# Patient Record
Sex: Female | Born: 1965 | Race: Black or African American | Hispanic: No | Marital: Married | Smoking: Never smoker
Health system: Southern US, Community
[De-identification: ages and names within clinical notes are randomized; demographics above are authoritative.]

## PROBLEM LIST (undated history)

## (undated) DIAGNOSIS — E559 Vitamin D deficiency, unspecified: Secondary | ICD-10-CM

## (undated) DIAGNOSIS — IMO0002 Reserved for concepts with insufficient information to code with codable children: Secondary | ICD-10-CM

## (undated) DIAGNOSIS — G43909 Migraine, unspecified, not intractable, without status migrainosus: Secondary | ICD-10-CM

## (undated) DIAGNOSIS — G4700 Insomnia, unspecified: Secondary | ICD-10-CM

## (undated) DIAGNOSIS — R5383 Other fatigue: Secondary | ICD-10-CM

## (undated) DIAGNOSIS — R0602 Shortness of breath: Secondary | ICD-10-CM

## (undated) DIAGNOSIS — D649 Anemia, unspecified: Secondary | ICD-10-CM

## (undated) DIAGNOSIS — I10 Essential (primary) hypertension: Secondary | ICD-10-CM

## (undated) DIAGNOSIS — M1990 Unspecified osteoarthritis, unspecified site: Secondary | ICD-10-CM

## (undated) DIAGNOSIS — M549 Dorsalgia, unspecified: Secondary | ICD-10-CM

## (undated) DIAGNOSIS — F419 Anxiety disorder, unspecified: Secondary | ICD-10-CM

## (undated) DIAGNOSIS — E538 Deficiency of other specified B group vitamins: Secondary | ICD-10-CM

## (undated) HISTORY — DX: Dorsalgia, unspecified: M54.9

## (undated) HISTORY — DX: Migraine, unspecified, not intractable, without status migrainosus: G43.909

## (undated) HISTORY — DX: Insomnia, unspecified: G47.00

## (undated) HISTORY — DX: Essential (primary) hypertension: I10

## (undated) HISTORY — DX: Reserved for concepts with insufficient information to code with codable children: IMO0002

## (undated) HISTORY — DX: Shortness of breath: R06.02

## (undated) HISTORY — DX: Anemia, unspecified: D64.9

## (undated) HISTORY — DX: Vitamin D deficiency, unspecified: E55.9

## (undated) HISTORY — DX: Other fatigue: R53.83

## (undated) HISTORY — DX: Deficiency of other specified B group vitamins: E53.8

## (undated) HISTORY — DX: Anxiety disorder, unspecified: F41.9

---

## 1996-11-10 HISTORY — PX: TUBAL LIGATION: SHX77

## 2003-11-11 HISTORY — PX: APPENDECTOMY: SHX54

## 2007-05-17 ENCOUNTER — Other Ambulatory Visit: Admission: RE | Admit: 2007-05-17 | Discharge: 2007-05-17 | Payer: Self-pay | Admitting: Obstetrics & Gynecology

## 2012-04-10 HISTORY — PX: BREAST CYST EXCISION: SHX579

## 2012-06-10 DIAGNOSIS — IMO0002 Reserved for concepts with insufficient information to code with codable children: Secondary | ICD-10-CM

## 2012-06-10 DIAGNOSIS — R87619 Unspecified abnormal cytological findings in specimens from cervix uteri: Secondary | ICD-10-CM

## 2012-06-10 HISTORY — DX: Reserved for concepts with insufficient information to code with codable children: IMO0002

## 2012-06-10 HISTORY — DX: Unspecified abnormal cytological findings in specimens from cervix uteri: R87.619

## 2012-07-11 HISTORY — PX: COLPOSCOPY: SHX161

## 2013-06-23 ENCOUNTER — Encounter: Payer: Self-pay | Admitting: Certified Nurse Midwife

## 2013-06-27 ENCOUNTER — Encounter: Payer: Self-pay | Admitting: Certified Nurse Midwife

## 2013-06-27 ENCOUNTER — Ambulatory Visit (INDEPENDENT_AMBULATORY_CARE_PROVIDER_SITE_OTHER): Payer: BC Managed Care – PPO | Admitting: Certified Nurse Midwife

## 2013-06-27 VITALS — BP 108/64 | HR 60 | Resp 16 | Ht 68.25 in | Wt 195.0 lb

## 2013-06-27 DIAGNOSIS — N870 Mild cervical dysplasia: Secondary | ICD-10-CM

## 2013-06-27 DIAGNOSIS — Z01419 Encounter for gynecological examination (general) (routine) without abnormal findings: Secondary | ICD-10-CM

## 2013-06-27 NOTE — Progress Notes (Signed)
47 y.o. . Married g2p2002  African American Fe here for annual exam. Periods irregular past 3 months, now regular. Has gained 10 lbs.in past year. Has scheduled visit with nutritionist for diet counseling and plans to start Weight watchers.  Daughter(25) recently moved into apartment, for first time!! Sees PCP for aex and labs, recently decreased her Lexapro to 5 mg to help with weight change, working well.  No health issues today.  Patient's last menstrual period was 06/19/2013.          Sexually active: yes  The current method of family planning is tubal ligation.    Exercising: yes  some walking Smoker:  no  Health Maintenance: Pap:  06/18/12 ASCUS HPV + Colposcopy9/13 CIN1 MMG:  05/02/13 normal Colonoscopy:  2005 BMD:   none TDaP:  2006 Labs: none Self breast exam: done occ   reports that she has never smoked. She does not have any smokeless tobacco history on file. She reports that she does not drink alcohol or use illicit drugs.  Past Medical History  Diagnosis Date  . Insomnia   . Abnormal Pap smear 8/13    ASCUS +HPV  . Anxiety   . Anemia   . Hip arthritis     left    Past Surgical History  Procedure Laterality Date  . Colposcopy  9/13    CIN1  . Tubal ligation  1998    BTL  . Appendectomy  2005  . Breast cyst excision Left 6/13    Current Outpatient Prescriptions  Medication Sig Dispense Refill  . B Complex Vitamins (B COMPLEX PO) Take by mouth daily.      . Butalbital-Aspirin-Caffeine (BUTALBITAL-ASA-CAFFEINE PO) Take by mouth as needed.      Marland Kitchen escitalopram (LEXAPRO) 10 MG tablet Take 5 mg by mouth daily.       . Multiple Vitamins-Minerals (MULTIVITAMIN PO) Take by mouth daily.       No current facility-administered medications for this visit.    Family History  Problem Relation Age of Onset  . Hypertension Mother   . Hypertension Father     ROS:  Pertinent items are noted in HPI.  Otherwise, a comprehensive ROS was negative.  Exam:   BP 108/64   Pulse 60  Resp 16  Ht 5' 8.25" (1.734 m)  Wt 195 lb (88.451 kg)  BMI 29.42 kg/m2  LMP 06/19/2013 Height: 5' 8.25" (173.4 cm)  Ht Readings from Last 3 Encounters:  06/27/13 5' 8.25" (1.734 m)    General appearance: alert, cooperative and appears stated age Head: Normocephalic, without obvious abnormality, atraumatic Neck: no adenopathy, supple, symmetrical, trachea midline and thyroid normal to inspection and palpation Lungs: clear to auscultation bilaterally Breasts: normal appearance, no masses or tenderness, No nipple retraction or dimpling, No nipple discharge or bleeding, No axillary or supraclavicular adenopathy Heart: regular rate and rhythm Abdomen: soft, non-tender; no masses,  no organomegaly Extremities: extremities normal, atraumatic, no cyanosis or edema Skin: Skin color, texture, turgor normal. No rashes or lesions Lymph nodes: Cervical, supraclavicular, and axillary nodes normal. No abnormal inguinal nodes palpated Neurologic: Grossly normal   Pelvic: External genitalia:  no lesions              Urethra:  normal appearing urethra with no masses, tenderness or lesions              Bartholin's and Skene's: normal                 Vagina: normal appearing  vagina with normal color and discharge, no lesions              Cervix: normal, non tender              Pap taken: yes Bimanual Exam:  Uterus:  normal size, contour, position, consistency, mobility, non-tender and anteverted              Adnexa: normal adnexa and no mass, fullness, tenderness               Rectovaginal: Confirms               Anus:  normal sphincter tone, no lesions  A:  Well Woman with normal exam  History of anxiety with medication dosage change with PCP management.  Weight change with nutrition consult scheduled  History of abnormal pap CIN1  P:  Reviewed health and wellness pertinent to exam  Continue follow up as indicated  Work on regular exercise with also weight training  Repeat pap today  if negative repeat pap in one year,if  Not per results   Pap smear as per guidelines   Mammogram yearly pap smear taken today with HPVHR  counseled on breast self exam, mammography screening, adequate intake of calcium and vitamin D, diet and exercise  return annually or prn  An After Visit Summary was printed and given to the patient.

## 2013-06-27 NOTE — Patient Instructions (Addendum)

## 2013-06-28 ENCOUNTER — Encounter: Payer: Self-pay | Admitting: Certified Nurse Midwife

## 2013-06-30 NOTE — Progress Notes (Signed)
Note reviewed, agree with plan.  Rosebud Koenen, MD  

## 2013-07-01 LAB — IPS PAP TEST WITH HPV

## 2013-07-13 ENCOUNTER — Other Ambulatory Visit: Payer: Self-pay | Admitting: Certified Nurse Midwife

## 2013-07-13 DIAGNOSIS — IMO0001 Reserved for inherently not codable concepts without codable children: Secondary | ICD-10-CM

## 2013-07-14 ENCOUNTER — Telehealth: Payer: Self-pay | Admitting: Orthopedic Surgery

## 2013-07-14 NOTE — Telephone Encounter (Addendum)
Spoke with pt about Pap results and need for repeat colpo with DL. Pt is a Runner, broadcasting/film/video and needs a later appt. Sched appt 07-21-13 at 4 pm. Advised pt to take 800 mg of ibuprofen an hour before the appt with food and be sure she is hydrated as well. Pt is expecting menses at some point next week, but her cycles are irregular. Advised pt to call back to reschedule if her menses starts in the days before the appt, as a clear view of the cervix is needed for procedure. Pt agreeable. Pt would also like DL to give her a call when she gets a chance.

## 2013-07-14 NOTE — Telephone Encounter (Signed)
Message copied by Alfredo Batty on Thu Jul 14, 2013  9:44 AM ------      Message from: Verner Chol      Created: Wed Jul 13, 2013 10:34 PM       Notify patient that pap smear is ASCUS again but HPV negative, but will need repeat colposcopy as we discussed.      Order in for colposcopy ------

## 2013-07-20 NOTE — Telephone Encounter (Signed)
Spoke with patient and rescheduled her colpo for 9/15 @ 4 pm. Patient has a question for Debbi in regards to the colposcopy. Please call patient.

## 2013-07-21 ENCOUNTER — Ambulatory Visit: Payer: BC Managed Care – PPO

## 2013-07-21 ENCOUNTER — Telehealth: Payer: Self-pay | Admitting: Certified Nurse Midwife

## 2013-07-21 ENCOUNTER — Ambulatory Visit: Payer: BC Managed Care – PPO | Admitting: Certified Nurse Midwife

## 2013-07-21 NOTE — Telephone Encounter (Signed)
Called patient this am

## 2013-07-21 NOTE — Telephone Encounter (Signed)
This is fine 

## 2013-07-25 ENCOUNTER — Ambulatory Visit (INDEPENDENT_AMBULATORY_CARE_PROVIDER_SITE_OTHER): Payer: BC Managed Care – PPO

## 2013-07-25 ENCOUNTER — Encounter: Payer: Self-pay | Admitting: Certified Nurse Midwife

## 2013-07-25 ENCOUNTER — Ambulatory Visit (INDEPENDENT_AMBULATORY_CARE_PROVIDER_SITE_OTHER): Payer: BC Managed Care – PPO | Admitting: Certified Nurse Midwife

## 2013-07-25 VITALS — BP 110/70 | HR 64 | Resp 16 | Ht 68.25 in | Wt 197.0 lb

## 2013-07-25 DIAGNOSIS — IMO0001 Reserved for inherently not codable concepts without codable children: Secondary | ICD-10-CM

## 2013-07-25 DIAGNOSIS — R6889 Other general symptoms and signs: Secondary | ICD-10-CM

## 2013-07-25 NOTE — Progress Notes (Signed)
Pt took 400mg  ibuprofen at 3:30pm. Pt had pap 06/27/13 ASCUS -HPV

## 2013-07-25 NOTE — Telephone Encounter (Signed)
Patient aware of abnormal pap smear. Reviewed that HPVHR negative, but colposcopy needed. Patient agreeable. Questions addressed. Patient scheduled for colpo.

## 2013-07-25 NOTE — Progress Notes (Addendum)
Patient ID: Michaela Hanson, female   DOB: [birth year], 47 y.o.   MRN: [id number]  Chief Complaint  Patient presents with  . Colposcopy    pap 06/27/13 ASCUS - HPV    HPI Michaela Hanson is a 47 y.o. female. g2 p2002 here for abnormal pap smear. LMP 07/16/13. AEX   06/27/2013 all normal. Contraception BTL. Denies any health issues today. HPI  Indications: Pap smear on August 202014 showed: ASCUS with NEGATIVE high risk HPV. Previous colposcopy: CIN 1  HPV effect, ECC negative.  Prior cervical treatment: no treatment.  Past Medical History  Diagnosis Date  . Insomnia   . Abnormal Pap smear 8/13    ASCUS +HPV  . Anxiety   . Anemia   . Hip arthritis     left    Past Surgical History  Procedure Laterality Date  . Colposcopy  9/13    CIN1  . Tubal ligation  1998    BTL  . Appendectomy  2005  . Breast cyst excision Left 6/13    Family History  Problem Relation Age of Onset  . Hypertension Mother   . Hypertension Father   . Breast cancer Sister   . Breast cancer Paternal Aunt     Social History History  Substance Use Topics  . Smoking status: Never Smoker   . Smokeless tobacco: Not on file  . Alcohol Use: No    Allergies  Allergen Reactions  . Flagyl [Metronidazole]     nausea    Current Outpatient Prescriptions  Medication Sig Dispense Refill  . B Complex Vitamins (B COMPLEX PO) Take by mouth. 10 days before menstrual cycle      . Butalbital-Aspirin-Caffeine (BUTALBITAL-ASA-CAFFEINE PO) Take by mouth as needed.      Marland Kitchen escitalopram (LEXAPRO) 10 MG tablet Take 5 mg by mouth daily.       . Multiple Vitamins-Minerals (MULTIVITAMIN PO) Take by mouth daily.       No current facility-administered medications for this visit.    Review of Systems Review of Systems  Constitutional: Negative.   Genitourinary: Negative for vaginal bleeding, vaginal discharge, vaginal pain and pelvic pain.  Neurological: Negative.     Blood pressure 110/70, pulse 64, resp. rate 16, height  5' 8.25" (1.734 m), weight 197 lb (89.359 kg), last menstrual period 07/16/2013.  Physical Exam Physical Exam  Constitutional: She is oriented to person, place, and time. She appears well-developed and well-nourished.  Genitourinary: Vagina normal.    Neurological: She is alert and oriented to person, place, and time.  Skin: Skin is warm and dry.  Psychiatric: She has a normal mood and affect.    Data Reviewed Reviewed pap smear results.  Assessment   1-ASCUS pap with negative HPVHR, follow up pap from previous abnormal pap. Here for colposcopy. Procedure Details  The risks and benefits of the procedure and Written informed consent obtained.  Speculum placed in vagina and excellent visualization of cervix achieved, cervix swabbed x 3 with saline for mucous removal and  with acetic acid solution. Acetowhite effect noted. Cervix viewed with 3.75,7.7, 15.0 and green filter.Acetowhite lesion noted at 2 o'clock. Lugol's solution applied with non staining noted. Biopsy taken at 2 o'clock. ECC obtained. Monsel's applied to cervix. No active bleeding noted on removal of speculum. Patient tolerated procedure well.   Specimens: 2  Complications: none.     Plan    Specimens labelled and sent to Pathology. Patient will be notified of results when reviewed.  Instructions given to patient.  Pathology report: Biopsy showed LGSIL, mild dysplasia, changes consistent with HPV effect ECC: bland appearing endocervical glands without atypia or carcinoma, squamous epithelium with some changes suggestive of HPV effect Patient notified of result with follow up in one year with repeat Pap with HPVHR Pap recall. DL  Michaela Hanson 1/61/0960, 5:22 PM

## 2013-07-25 NOTE — Patient Instructions (Addendum)

## 2013-07-26 NOTE — Progress Notes (Signed)
Note reviewed, agree with plan.  Jayel Inks, MD  

## 2013-07-28 LAB — IPS CERVICAL/ECC/EMB/VULVAR/VAGINAL BIOPSY

## 2013-07-29 ENCOUNTER — Telehealth: Payer: Self-pay | Admitting: Certified Nurse Midwife

## 2013-07-29 NOTE — Telephone Encounter (Signed)
LMTCB regarding lab results.  

## 2013-07-29 NOTE — Telephone Encounter (Signed)
Spoke with patient and discussed findings of pathology report. Report showed LSIL with HPV effect, mild dysplasia, same as before. ECC showed HPV effect only ,no atypia or carcinoma. Questions addressed. Stressed repeat pap at aex in one year very important. Patient voiced understanding, and appreciated the call. Will put in Pap recall 08

## 2013-09-26 ENCOUNTER — Telehealth: Payer: Self-pay | Admitting: Certified Nurse Midwife

## 2013-09-26 ENCOUNTER — Ambulatory Visit: Payer: BC Managed Care – PPO | Admitting: Certified Nurse Midwife

## 2013-09-26 NOTE — Telephone Encounter (Signed)
Spoke with pt who is having vaginal odor and light brown discharge. Pt has noticed it since the beginning of November when she started a steroid pack for her back. Pt also had her cycle and still is having the issue. Advised pt that DL would really need to see her to make a diagnosis. Pt's husband is out of town but will be returning today and can hopefully drive her in for a visit today. Sched OV today at 4 pm with DL. Pt will call back after she calls him if the appt time will not work.

## 2013-09-26 NOTE — Telephone Encounter (Signed)
Spoke with pt who called to say her husband is still out of town and cannot give her a ride today. Rescheduled appt to 09-28-13 at 9:15 as pt has PT later that morning.

## 2013-09-26 NOTE — Telephone Encounter (Signed)
pt has a herniated disk and pinch nerve and has been on alot of medications. She now has a yeast infection and can not drive due to the medications she is taking. Can she have something called into the pharmacy?

## 2013-09-28 ENCOUNTER — Ambulatory Visit: Payer: BC Managed Care – PPO | Admitting: Certified Nurse Midwife

## 2013-09-28 NOTE — Telephone Encounter (Signed)
Called pt regarding missed 9:15 appointment. She thought it was at 10:15 and was very apologetic and flustered because she has noone to bring her Thursday or Friday.  She has a back injury and can't drive. Did not wish to reschedule at this time and will try something over the counter.

## 2013-11-22 ENCOUNTER — Ambulatory Visit (INDEPENDENT_AMBULATORY_CARE_PROVIDER_SITE_OTHER): Payer: BC Managed Care – PPO | Admitting: Certified Nurse Midwife

## 2013-11-22 ENCOUNTER — Encounter: Payer: Self-pay | Admitting: Certified Nurse Midwife

## 2013-11-22 VITALS — BP 110/78 | HR 72 | Resp 16 | Ht 68.25 in | Wt 202.0 lb

## 2013-11-22 DIAGNOSIS — N760 Acute vaginitis: Secondary | ICD-10-CM

## 2013-11-22 DIAGNOSIS — S3992XA Unspecified injury of lower back, initial encounter: Secondary | ICD-10-CM

## 2013-11-22 MED ORDER — METRONIDAZOLE 0.75 % VA GEL: 1.0000 | Freq: Every day | VAGINAL | Status: DC

## 2013-11-22 NOTE — Patient Instructions (Signed)

## 2013-11-22 NOTE — Progress Notes (Signed)
48 y.o.Married [place] female [phone] with a 1 month(s) history of the following:burning and discharge described as malodorous, grey and watery Sexually active: yes Last sexual activity:2 months ago. Pt also reports the following associated symptoms: none Patient has tried over the counter treatment of Monistat  with minimal relief. 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 Patient has been on 3 steroid packs and medication. Feeling better but the vaginal odor has persisted.No new personal products.  O: Healthy female WDWN Affect: normal, orientation x 3    Exam:  AOZ:[id number]:Bartholin's, Urethra, Skene's normal, no lesions, redness or tenderness                Vag:no lesions, discharge: copious, watery and malodorous, pH 5.5, wet prep done                Cx:  normal appearance and non tender                Uterus:normal size, non-tender, normal shape and consistency                Adnexa: normal adnexa and no mass, fullness, tenderness  Wet Prep shows:clue cells   GE:[id number]:bacterial vaginosis Back Injury with slipped disc under follow up Social stress with loss of mother in law   P: Reviewed findings. Discussed the medications may have contributed to ph change in vagina and BV occurrence. Rx Metrogel see order :Symptomatic local care discussed. Transport plannerducational materials distributed. Continue follow up as indicated Offered my condolences to family regarding mother in law's death.   RV prn

## 2013-11-23 DIAGNOSIS — S3992XA Unspecified injury of lower back, initial encounter: Secondary | ICD-10-CM | POA: Insufficient documentation

## 2013-11-25 ENCOUNTER — Encounter: Payer: Self-pay | Admitting: Certified Nurse Midwife

## 2013-11-25 NOTE — Progress Notes (Signed)
Reviewed personally.  M. Suzanne Dalayna Lauter, MD.  

## 2014-02-07 ENCOUNTER — Encounter: Payer: Self-pay | Admitting: Certified Nurse Midwife

## 2014-02-07 ENCOUNTER — Encounter: Payer: BC Managed Care – PPO | Admitting: Certified Nurse Midwife

## 2014-02-08 ENCOUNTER — Ambulatory Visit: Payer: BC Managed Care – PPO | Admitting: Certified Nurse Midwife

## 2014-02-14 ENCOUNTER — Ambulatory Visit: Payer: BC Managed Care – PPO | Admitting: Certified Nurse Midwife

## 2014-02-14 NOTE — Progress Notes (Signed)
Patient unable to stay for visit due to work per [employer] Johnson CMA.

## 2014-02-16 ENCOUNTER — Ambulatory Visit (INDEPENDENT_AMBULATORY_CARE_PROVIDER_SITE_OTHER): Payer: BC Managed Care – PPO | Admitting: Certified Nurse Midwife

## 2014-02-16 ENCOUNTER — Encounter: Payer: Self-pay | Admitting: Certified Nurse Midwife

## 2014-02-16 VITALS — BP 108/66 | HR 80 | Resp 18 | Ht 68.5 in | Wt 205.0 lb

## 2014-02-16 DIAGNOSIS — N951 Menopausal and female climacteric states: Secondary | ICD-10-CM

## 2014-02-16 DIAGNOSIS — N912 Amenorrhea, unspecified: Secondary | ICD-10-CM

## 2014-02-16 LAB — POCT URINE PREGNANCY: Preg Test, Ur: NEGATIVE

## 2014-02-16 MED ORDER — MEDROXYPROGESTERONE ACETATE 10 MG PO TABS: 10.0000 mg | ORAL_TABLET | Freq: Every day | ORAL | Status: DC

## 2014-02-16 NOTE — Patient Instructions (Signed)
Perimenopause  Perimenopause is the time when your body begins to move into the menopause (no menstrual period for 12 straight months). It is a natural process. Perimenopause can begin 2 8 years before the menopause and usually lasts for 1 year after the menopause. During this time, your ovaries may or may not produce an egg. The ovaries vary in their production of estrogen and progesterone hormones each month. This can cause irregular menstrual periods, difficulty getting pregnant, vaginal bleeding between periods, and uncomfortable symptoms.  CAUSES  · Irregular production of the ovarian hormones, estrogen and progesterone, and not ovulating every month.  · Other causes include:  · Tumor of the pituitary gland in the brain.  · Medical disease that affects the ovaries.  · Radiation treatment.  · Chemotherapy.  · Unknown causes.  · Heavy smoking and excessive alcohol intake can bring on perimenopause sooner.  SIGNS AND SYMPTOMS   · Hot flashes.  · Night sweats.  · Irregular menstrual periods.  · Decreased sex drive.  · Vaginal dryness.  · Headaches.  · Mood swings.  · Depression.  · Memory problems.  · Irritability.  · Tiredness.  · Weight gain.  · Trouble getting pregnant.  · The beginning of losing bone cells (osteoporosis).  · The beginning of hardening of the arteries (atherosclerosis).  DIAGNOSIS   Your health care provider will make a diagnosis by analyzing your age, menstrual history, and symptoms. He or she will do a physical exam and note any changes in your body, especially your female organs. Female hormone tests may or may not be helpful depending on the amount of female hormones you produce and when you produce them. However, other hormone tests may be helpful to rule out other problems.  TREATMENT   In some cases, no treatment is needed. The decision on whether treatment is necessary during the perimenopause should be made by you and your health care provider based on how the symptoms are affecting you  and your lifestyle. Various treatments are available, such as:  · Treating individual symptoms with a specific medicine for that symptom.  · Herbal medicines that can help specific symptoms.  · Counseling.  · Group therapy.  HOME CARE INSTRUCTIONS   · Keep track of your menstrual periods (when they occur, how heavy they are, how long between periods, and how long they last) as well as your symptoms and when they started.  · Only take over-the-counter or prescription medicines as directed by your health care provider.  · Sleep and rest.  · Exercise.  · Eat a diet that contains calcium (good for your bones) and soy (acts like the estrogen hormone).  · Do not smoke.  · Avoid alcoholic beverages.  · Take vitamin supplements as recommended by your health care provider. Taking vitamin E may help in certain cases.  · Take calcium and vitamin D supplements to help prevent bone loss.  · Group therapy is sometimes helpful.  · Acupuncture may help in some cases.  SEEK MEDICAL CARE IF:   · You have questions about any symptoms you are having.  · You need a referral to a specialist (gynecologist, psychiatrist, or psychologist).  SEEK IMMEDIATE MEDICAL CARE IF:   · You have vaginal bleeding.  · Your period lasts longer than 8 days.  · Your periods are recurring sooner than 21 days.  · You have bleeding after intercourse.  · You have severe depression.  · You have pain when you urinate.  ·   You have severe headaches.  · You have vision problems.  Document Released: 12/04/2004 Document Revised: 08/17/2013 Document Reviewed: 05/26/2013  [employer]® Patient Information ©2014 [employer].

## 2014-02-16 NOTE — Progress Notes (Signed)
  48 y.o.MarriedAfrican Americanfemale presents with no menses sinceJanuary 21/15. Currently periods are occurring every 28 days.   Bleeding is light and moderate.  Periods were regular in the past. Patient reports no weight loss, but is having, night sweats  and hot flashes and feels bloated . Contraception BTL. Negative UPT here today. Patient not having any other health issues. No history of thyroid issues.  Pertinent items are noted in HPI. Neck: no adenopathy, supple, symmetrical, trachea midline and thyroid not enlarged, symmetric, no tenderness/mass/nodules  Abdomen: non tender Pelvic exam: External genital: normal BUS: normal Vagina: normal, scant discharge Cervix: normal, non tender Uterus: normal, anteverted, non tender Adnexa: no masses or fullness, normal, non tender  Assessment: Perimenopausal with amenorrhea and cycle changes Normal pelvic exam  Plan: Discussed with patient factors that may be contributory to menstrual abnormalities include diet normal and hormonal factors thyroid or pituitary or menopausal.  Questions addressed. Etiology and expectations with perimenopause discussed. Recommend Rx Provera 10 mg x 10 days to stimulate any withdrawal bleeding for any lining that is present. Discussed risk of heavy bleeding or hyperplasia with long periods without bleeding. Patient agreeable  Rx Provera see order Advised to keep menses calendar and to advise if has or does not have withdrawal bleeding up to 2 weeks after completes Provera. Lab:TSH,FSH,Prolactin  Patient will advise if changes.  Rv prn

## 2014-02-17 LAB — FOLLICLE STIMULATING HORMONE: FSH: 50.9 m[IU]/mL

## 2014-02-17 LAB — TSH: TSH: 1.445 u[IU]/mL (ref 0.350–4.500)

## 2014-02-17 LAB — PROLACTIN: Prolactin: 8.7 ng/mL

## 2014-02-17 NOTE — Progress Notes (Signed)
Reviewed personally.  M. Suzanne Dewane Timson, MD.  

## 2014-02-19 ENCOUNTER — Other Ambulatory Visit: Payer: Self-pay | Admitting: Certified Nurse Midwife

## 2014-02-20 ENCOUNTER — Telehealth: Payer: Self-pay

## 2014-02-20 NOTE — Telephone Encounter (Signed)
lmtcb

## 2014-02-20 NOTE — Telephone Encounter (Signed)
Message copied by Eliezer BottomJOHNSON, Jaira Canady J on Mon Feb 20, 2014 10:26 AM ------      Message from: Verner CholLEONARD, DEBORAH S      Created: Sun Feb 19, 2014  4:53 PM       Notify patient that TSH,Prolactin normal      FSH Low menopausal range,but explains hot flashes and no periods      Patient needs to start Provera 10 mg for 10 days and notify if bleeding occurs while on Provera or during the 2 week period after completion or no bleeding      Patient has Rx for Provera and may have started it which is fine. ------

## 2014-02-21 NOTE — Telephone Encounter (Signed)
Patient notified of results. See lab 

## 2014-06-26 ENCOUNTER — Ambulatory Visit (INDEPENDENT_AMBULATORY_CARE_PROVIDER_SITE_OTHER): Payer: BC Managed Care – PPO | Admitting: Certified Nurse Midwife

## 2014-06-26 ENCOUNTER — Encounter: Payer: Self-pay | Admitting: Certified Nurse Midwife

## 2014-06-26 VITALS — BP 92/60 | HR 80 | Resp 16 | Ht 68.0 in | Wt 197.0 lb

## 2014-06-26 DIAGNOSIS — R87610 Atypical squamous cells of undetermined significance on cytologic smear of cervix (ASC-US): Secondary | ICD-10-CM

## 2014-06-26 DIAGNOSIS — R87810 Cervical high risk human papillomavirus (HPV) DNA test positive: Secondary | ICD-10-CM

## 2014-06-26 DIAGNOSIS — Z124 Encounter for screening for malignant neoplasm of cervix: Secondary | ICD-10-CM

## 2014-06-26 DIAGNOSIS — Z01419 Encounter for gynecological examination (general) (routine) without abnormal findings: Secondary | ICD-10-CM

## 2014-06-26 NOTE — Progress Notes (Signed)
48 y.o. G2P2002 Married African American Fe here for annual exam. Periods normal now with Provera use in 4/15.Marland Kitchen. Patient has had 2 deaths in family which has depressed her. PCP increased Lexapro to 10 mg daily which is working much better. All labs with PCP and aex normal except for borderline elevation of cholesterol which she is working on. No other health issues today.   Patient's last menstrual period was 06/17/2014.          Sexually active: Yes.    The current method of family planning is tubal ligation.    Exercising: Yes.    Walking 4 days weekly  Smoker:  no  Health Maintenance: WGN:FAOZH+Pap:ASCUS+. HR HPV negative  MMG: 04/2014 Repeat in 6 months report requested Colonoscopy:  2005. negative BMD:  06/2014 Normal - Take Vit D and Calcium bc of age TDaP:  2006 Labs: PCP   reports that she has never smoked. She has never used smokeless tobacco. She reports that she does not drink alcohol or use illicit drugs.  Past Medical History  Diagnosis Date  . Insomnia   . Abnormal Pap smear 8/13    ASCUS +HPV, 8/14 ASCUS HPV not detected  . Anxiety   . Anemia   . Hip arthritis     left    Past Surgical History  Procedure Laterality Date  . Tubal ligation  1998    BTL  . Appendectomy  2005  . Breast cyst excision Left 6/13  . Colposcopy  9/13    CIN1, 9/14 LGSIL    Current Outpatient Prescriptions  Medication Sig Dispense Refill  . B Complex Vitamins (B COMPLEX PO) Take by mouth. 10 days before menstrual cycle      . Butalbital-Aspirin-Caffeine (BUTALBITAL-ASA-CAFFEINE PO) Take by mouth as needed.      Marland Kitchen. escitalopram (LEXAPRO) 5 MG tablet Take 10 mg by mouth daily.       . Multiple Vitamins-Minerals (MULTIVITAMIN PO) Take by mouth daily.       No current facility-administered medications for this visit.    Family History  Problem Relation Age of Onset  . Hypertension Mother   . Hypertension Father   . Breast cancer Sister   . Breast cancer Paternal Aunt     ROS:  Pertinent  items are noted in HPI.  Otherwise, a comprehensive ROS was negative.  Exam:   BP 92/60  Pulse 80  Resp 16  Ht 5\' 8"  (1.727 m)  Wt 197 lb (89.359 kg)  BMI 29.96 kg/m2  LMP 06/17/2014 Height: 5\' 8"  (172.7 cm)  Ht Readings from Last 3 Encounters:  06/26/14 5\' 8"  (1.727 m)  02/16/14 5' 8.5" (1.74 m)  02/07/14 5' 8.25" (1.734 m)    General appearance: alert, cooperative and appears stated age Head: Normocephalic, without obvious abnormality, atraumatic Neck: no adenopathy, supple, symmetrical, trachea midline and thyroid normal to inspection and palpation Lungs: clear to auscultation bilaterally Breasts: normal appearance, no masses or tenderness, No nipple retraction or dimpling, No nipple discharge or bleeding, No axillary or supraclavicular adenopathy Heart: regular rate and rhythm Abdomen: soft, non-tender; no masses,  no organomegaly Extremities: extremities normal, atraumatic, no cyanosis or edema Skin: Skin color, texture, turgor normal. No rashes or lesions Lymph nodes: Cervical, supraclavicular, and axillary nodes normal. No abnormal inguinal nodes palpated Neurologic: Grossly normal   Pelvic: External genitalia:  no lesions              Urethra:  normal appearing urethra with no masses, tenderness or  lesions              Bartholin's and Skene's: normal                 Vagina: normal appearing vagina with normal color and discharge, no lesions              Cervix: normal appearance, non tender, no lesions              Pap taken: Yes.   Bimanual Exam:  Uterus:  normal size, contour, position, consistency, mobility, non-tender and anteverted              Adnexa: normal adnexa and no mass, fullness, tenderness               Rectovaginal: Confirms               Anus:  normal sphincter tone, no lesions  A:  Well Woman with normal exam  Contraception tubal  Depression on Lexapro with PCP management  Mammogram follow up in 12/15  History of abnormal pap ASUS - HPVHR last  pap, previous history of CIN1  Social stress  P:   Reviewed health and wellness pertinent to exam  Continue follow up as needed.  Stressed mammogram follow up, report requested  Follow up pap smear today  Discussed seeking sisters support and church.  Aware of grief support groups.  Pap smear taken today with HPVHR   counseled on breast self exam, mammography screening, adequate intake of calcium and vitamin D, diet and exercise  return annually or prn  An After Visit Summary was printed and given to the patient.

## 2014-06-26 NOTE — Patient Instructions (Signed)

## 2014-06-27 NOTE — Progress Notes (Signed)
Reviewed personally.  M. Suzanne Dillan Candela, MD.  

## 2014-06-28 ENCOUNTER — Ambulatory Visit: Payer: BC Managed Care – PPO | Admitting: Certified Nurse Midwife

## 2014-06-29 LAB — IPS PAP TEST WITH HPV

## 2014-06-29 NOTE — Addendum Note (Signed)
Addended by: Verner CholLEONARD, Tanisa Lagace S on: 06/29/2014 04:43 PM   Modules accepted: Orders

## 2014-06-30 ENCOUNTER — Telehealth: Payer: Self-pay

## 2014-06-30 NOTE — Telephone Encounter (Signed)
Spoke with patient. Advised of results as seen below from Verner Choleborah S. Leonard CNM. Patient agreeable and verbalizes understanding. Patient will call on first day of menses to schedule colpo. Patient states that when she was 20 she was seen at [facility] and "They froze the abnormal cells and it kindof melted off. Do you guys do that there?" Patient can not remember the name of the procedure. Advised patient that we do perform other procedures in the office but that I am not aware of what procedure she exactly had. Advised that colposcopy is what is recommended at this time but that I would send a message over to Dr.Miller who is covering for Verner Choleborah S. Leonard CNM and give patient a call back with further information. Patient is agreeable.  Pulled patients paper chart do I do not see any records from this procedure.   Routing to Dr.Miller Cc: Verner Choleborah S. Leonard CNM

## 2014-06-30 NOTE — Telephone Encounter (Signed)
Left message to call Hajira Verhagen at [phone]. 

## 2014-06-30 NOTE — Telephone Encounter (Signed)
Pt returning call

## 2014-06-30 NOTE — Telephone Encounter (Signed)
Message copied by Jannet AskewHINES, Cleophus Mendonsa E on Fri Jun 30, 2014  2:12 PM ------      Message from: Verner CholLEONARD, DEBORAH S      Created: Thu Jun 29, 2014  4:42 PM       Notify patient that pap smear is showing ASCUS with positive HPVHR again will need to schedule colposcopy again order in ------

## 2014-06-30 NOTE — Telephone Encounter (Signed)
Cryotherapy to the cervix (freezing) has really fallen out of the recommendations for treatment of abnormal cells at this time.  Now, more women are followed conservatively as many abnormalities will resolve if we are just patient and watch carefully to make sure no progression occurs.  Does that help?

## 2014-07-03 NOTE — Telephone Encounter (Signed)
Left message to call Tamme Mozingo at [phone]. 

## 2014-07-05 NOTE — Telephone Encounter (Signed)
Spoke with patient. Advised of message as seen below. Patient is agreeable and verbalizes understanding. Patient will call back with first day of menses to schedule colposcopy. In result hold to make sure she schedules.  Routing to Dr.Miller as was covering Cc: Verner Chol CNM   Routing to provider for final review. Patient agreeable to disposition. Will close encounter

## 2014-07-05 NOTE — Telephone Encounter (Signed)
Left message to call Michaela Hanson at [phone]. 

## 2014-07-14 ENCOUNTER — Telehealth: Payer: Self-pay | Admitting: Certified Nurse Midwife

## 2014-07-14 NOTE — Telephone Encounter (Signed)
Spoke with patient. Patient started cycle yesterday and would like to schedule colposcopy. Appointment scheduled for 9/10 at 2pm with Verner Chol CNM. Patient agreeable to date and time. Colposcopy pre-procedure instructions given. Motrin instructions given. Motrin=Advil=Ibuprofen Can take 800 mg (Can purchase over the counter, you will need four 200 mg pills) every 8 hours as needed.  Take with food. Make sure to eat a meal before appointment and drink plenty of fluids. Patient verbalized understanding and will call to reschedule if will be on menses or has any concerns regarding pregnancy. Advised will need to cancel within 24 hours or will have $100.00 no show fee placed to account.  Routing to provider for final review. Patient agreeable to disposition. Will close encounter

## 2014-07-14 NOTE — Telephone Encounter (Signed)
Left message to call Aracelia Brinson at [phone]. 

## 2014-07-14 NOTE — Telephone Encounter (Signed)
Pt says she is calling to let debbi know she started her cycle yesterday 07/13/14 and she needs to schedule the colpo.

## 2014-07-20 ENCOUNTER — Ambulatory Visit (INDEPENDENT_AMBULATORY_CARE_PROVIDER_SITE_OTHER): Payer: BC Managed Care – PPO | Admitting: Certified Nurse Midwife

## 2014-07-20 ENCOUNTER — Encounter: Payer: Self-pay | Admitting: Certified Nurse Midwife

## 2014-07-20 VITALS — BP 110/74 | HR 68 | Resp 16 | Ht 68.0 in | Wt 202.0 lb

## 2014-07-20 DIAGNOSIS — R87619 Unspecified abnormal cytological findings in specimens from cervix uteri: Secondary | ICD-10-CM | POA: Insufficient documentation

## 2014-07-20 DIAGNOSIS — R87810 Cervical high risk human papillomavirus (HPV) DNA test positive: Secondary | ICD-10-CM

## 2014-07-20 DIAGNOSIS — R87610 Atypical squamous cells of undetermined significance on cytologic smear of cervix (ASC-US): Secondary | ICD-10-CM

## 2014-07-20 NOTE — Progress Notes (Signed)
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 9/15 ASCUS +HPV HR. Pt took  ibuprofen at 1pm

## 2014-07-20 NOTE — Patient Instructions (Signed)

## 2014-07-20 NOTE — Progress Notes (Signed)
Patient ID: Michaela Hanson, female   DOB: Feb 07, 1966, 48 y.o.   MRN: [id number]  Chief Complaint  Patient presents with  . Colposcopy    HPI Michaela Hanson is a 48 y.o.  african amercan female g2 p2002 here for colposcopy exam. Denies pelvic pain or bleeding. Patient had epidural injection this am for back pain. HPI  Indications: Pap smear on 8/17 2015 showed: ASCUS with POSITIVE high risk HPV. Previous colposcopy: CIN 1 in 9/14. Prior cervical treatment: no treatment.  Past Medical History  Diagnosis Date  . Insomnia   . Abnormal Pap smear 8/13    8/13 ASCUS +HPV, 8/14 ASCUS HPV not detected, 06-26-14 ASCUS +HPV HR  . Anxiety   . Anemia   . Hip arthritis     left    Past Surgical History  Procedure Laterality Date  . Tubal ligation  1998    BTL  . Appendectomy  2005  . Breast cyst excision Left 6/13  . Colposcopy  9/13    CIN1, 9/14 LGSIL    Family History  Problem Relation Age of Onset  . Hypertension Mother   . Hypertension Father   . Breast cancer Sister   . Breast cancer Paternal Aunt     Social History History  Substance Use Topics  . Smoking status: Never Smoker   . Smokeless tobacco: Never Used  . Alcohol Use: No    Allergies  Allergen Reactions  . Flagyl [Metronidazole]     nausea    Current Outpatient Prescriptions  Medication Sig Dispense Refill  . B Complex Vitamins (B COMPLEX PO) Take by mouth. 10 days before menstrual cycle      . Butalbital-Aspirin-Caffeine (BUTALBITAL-ASA-CAFFEINE PO) Take by mouth as needed.      Marland Kitchen escitalopram (LEXAPRO) 5 MG tablet Take 10 mg by mouth daily.       . Multiple Vitamins-Minerals (MULTIVITAMIN PO) Take by mouth daily.       No current facility-administered medications for this visit.    Review of Systems Review of Systems  Constitutional: Negative.   Genitourinary: Negative for vaginal bleeding, vaginal discharge and vaginal pain.    Blood pressure 110/74, pulse 68, resp. rate 16, height  (1.727  m), weight 202 lb (91.627 kg), last menstrual period 07/13/2014.  Physical Exam Physical Exam  Constitutional: She appears well-developed and well-nourished.  Genitourinary: Vagina normal.    Neurological: She is alert.  Skin: Skin is warm and dry.  Psychiatric: She has a normal mood and affect. Her behavior is normal. Judgment and thought content normal.    Data Reviewed Reviewed pap smear results. Questions addressed.  Assessment    Procedure Details  The risks and benefits of the procedure and Written informed consent obtained.  Speculum placed in vagina and excellent visualization of cervix achieved, cervix swabbed x 3 with saline and  acetic acid solution. Faint HPV effect noted. Acetowhite area noted at 10  O'clock. Cervix viewed with 3.75,7.5,15# and green filter. Lugols applied and non staining noted in area, Biopsy taken at 10 o'clock. ECC obtained. Monsel's applied. No active bleeding on removal of speculum. Tolerated procedure well. Instructions given.  Specimens: 2  Complications: none.     Plan    Specimens labelled and sent to Pathology. Pathology will be reviewed when in and patient notified     Pathology reviewed and biopsy at 10 o'clock showed Koilocytic changes consistent with LSIL, ECC showed LSIL. Pap smear correlates well with finding. Patient to be notified of results  and recommendation per consultation with Dr.Miller regarding LEEP procedure recommendation.   Michaela Hanson 07/20/2014, 5:54 PM

## 2014-07-24 LAB — IPS OTHER TISSUE BIOPSY

## 2014-07-26 ENCOUNTER — Telehealth: Payer: Self-pay | Admitting: Certified Nurse Midwife

## 2014-07-26 NOTE — Telephone Encounter (Signed)
LMTCB

## 2014-07-26 NOTE — Telephone Encounter (Signed)
Patient returning Debbi's call. °

## 2014-07-26 NOTE — Telephone Encounter (Signed)
LMTCB and ask to speak to Michaela Hanson

## 2014-07-27 ENCOUNTER — Telehealth: Payer: Self-pay | Admitting: Emergency Medicine

## 2014-07-27 DIAGNOSIS — IMO0002 Reserved for concepts with insufficient information to code with codable children: Secondary | ICD-10-CM

## 2014-07-27 NOTE — Telephone Encounter (Signed)
PR: $81 copay

## 2014-07-27 NOTE — Telephone Encounter (Addendum)
Patient notified, please see telephone encounter in today's date.

## 2014-07-27 NOTE — Telephone Encounter (Signed)
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

## 2014-07-27 NOTE — Telephone Encounter (Signed)
Notes Recorded by Verner Chol, CNM on 07/26/2014 at 8:39 AM Addendum to previous note. In review of previous results and 3 rd colpo and with LSIL in ECC, consulted with Dr Hyacinth Meeker regarding Leep and she feels this is appropriate treatment for patient, instead of repeat pap in one year, due previous finding and age. Patient to be notified and scheduled if agreeable. 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

## 2014-07-27 NOTE — Telephone Encounter (Signed)
Spoke with patient. Advised of message from Verner Chol CNM. Patient given results and plan. Patient is agreeable to scheduling LEEP with Dr. 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  Patient states she will call back with her calendar information on Monday.  Advised would send for insurance precert as well and can discuss that when she returns call.  Routing to [place] for  Herbalist.

## 2014-07-27 NOTE — Telephone Encounter (Signed)
Pt said she is calling call Ms Debbi back but Mrs Claria Dice here today or tomorrow. Pt wants to talk to someone

## 2014-07-27 NOTE — Telephone Encounter (Signed)
Message copied by Joeseph Amor on Thu Jul 27, 2014 10:48 AM ------      Message from: Verner Chol      Created: Wed Jul 26, 2014  7:18 PM       LMTCB so did not give result to patient. Please notify her and schedule      Thank you      Debbi ------

## 2014-07-31 NOTE — Progress Notes (Signed)
Reviewed personally.  M. Suzanne Ahmari Garton, MD.  

## 2014-07-31 NOTE — Telephone Encounter (Signed)
Pt says she would like to speak with Ms Eunice Blase before making a decision about having the procedure done.

## 2014-07-31 NOTE — Telephone Encounter (Signed)
Routing to Verner Chol CNM as patient requests to speak with her.

## 2014-08-01 NOTE — Telephone Encounter (Signed)
Phone call to patient regarding colposcopy results and management plan with LEEP preocedure. Discussed her pathology findings of LSIL from cervical biopsy which she has had for the past 3 biopsy and  ECC has been benign until now reflecting LSIL. Discussed due to due pathway HPVHR has taken increases her risk of activity with HPV in the cervical canal. Discussed LEEP procedure and anticipated benefit of treatment. Questions answered at length. Patient would now like to schedule. Patient aware that someone from insurance will contact her with information and she will be scheduled with Dr. Hyacinth Meeker. Patient requested that I be present if feasible for support. Discussed we would try to facilitate, but may not be possible, patient understands. Patient plans to bring spouse with her. Patient feels more informed and would like to proceed with LEEP.

## 2014-08-01 NOTE — Telephone Encounter (Signed)
Spoke with patient. She is ready to schedule LEEP after speaking with Verner Chol CNM today. Telephone encounter placed by Verner Chol CNM  She is agreeable to scheduling LEEP.   LEEP pre-procedure instructions given. Advised 800 mg of Motrin with food one hour prior to appointment. Motrin=Advil=Ibuprofen Can take 800 mg (Can purchase over the counter, you will need four 200 mg pills). Make sure to eat a meal before appointment and drink plenty of fluids. Patient verbalized understanding and will call to reschedule if will be on menses . Advised will need to cancel within 24 hours or will have $100.00 no show fee placed to account.  Type of birth control: Tubal Ligation  LMP expects next cycle 08/18/14 Copayment: $81.00-Patient notified.   Procedure room scheduled.   Dr. Hyacinth Meeker, patient reports begin very anxious, requesting that Verner Chol CNM be available for support for procedure but Verner Chol not in office that day. Patient will be bringing spouse as driver. Advised can plan for premedication with Ativan if you are agreeable. Advised will need to bring ride and arrive 30 minutes early to sign paperwork and pre-medication.

## 2014-08-02 NOTE — Telephone Encounter (Signed)
Pre procedure Ativan is fine.  She might want to come an hour early in case she needs a second dose.

## 2014-08-08 NOTE — Telephone Encounter (Signed)
Patient notified. She will come one hour early for appointmentt and bring driver.  Routing to provider for final review. Patient agreeable to disposition. Will close encounter

## 2014-08-21 ENCOUNTER — Telehealth: Payer: Self-pay | Admitting: Certified Nurse Midwife

## 2014-08-21 NOTE — Telephone Encounter (Signed)
I agree with plan.  Just keep appt.  She needs to let us know if/when she starts her cycle.  Thanks.

## 2014-08-21 NOTE — Telephone Encounter (Signed)
Pt is calling Michaela Hanson back °

## 2014-08-21 NOTE — Telephone Encounter (Signed)
Left message to call Raza Bayless at [phone]. 

## 2014-08-21 NOTE — Telephone Encounter (Signed)
Patient is asking to talk wit Michaela Hanson regarding her upcoming appointment.

## 2014-08-21 NOTE — Telephone Encounter (Signed)
Spoke with patient. Patient states that she was due to start her cycle on 10/3 but has not. States that her cycles are "irregular" and she doesn't have cycles every month.Patient is scheduled for LEEP on 10/16 at 3:30pm with Dr.Miller. 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 Patient would like to keep appointment for this date and time but is concerned since she has not yet started her cycle. Advised patient to keep appointment as it is for now and that I will send a message to Dr.Miller to see what she recommends. Advised patient to call back if cycle starts. Patient is agreeable.   Dr.Miller, please advise.

## 2014-08-21 NOTE — Telephone Encounter (Signed)
Spoke with patient. Confirmed that it is okay to keep appointment as scheduled for Friday per Dr.Miller. Patient will call if/when she starts cycle before Friday.   Routing to provider for final review. Patient agreeable to disposition. Will close encounter

## 2014-08-25 ENCOUNTER — Ambulatory Visit (INDEPENDENT_AMBULATORY_CARE_PROVIDER_SITE_OTHER): Payer: BC Managed Care – PPO | Admitting: Obstetrics & Gynecology

## 2014-08-25 VITALS — BP 126/82 | HR 64 | Resp 16 | Wt 203.8 lb

## 2014-08-25 DIAGNOSIS — R896 Abnormal cytological findings in specimens from other organs, systems and tissues: Secondary | ICD-10-CM

## 2014-08-25 DIAGNOSIS — IMO0002 Reserved for concepts with insufficient information to code with codable children: Secondary | ICD-10-CM

## 2014-08-25 NOTE — Progress Notes (Signed)
Patient ID: Michaela Stanfordatricia Hanson, female   DOB: 07/07/1966, 48 y.o.   MRN: [id number]  48 yo G2P2 MAAF here for LEEP due to persistent abnormal Pap over several years showing CIN1.  Pt has been counseled about conservative managmenet versus excisional procedure.  Pt would like to proceed with definitive management.  Risks and benefits discussed.  Specifically bleeding, cervical scarring, 10% failure rate, and need for future procedures discussed.   Consent obtained and pt is ready to proceed.  Patient's last menstrual period was 07/13/2014. bilateral tubal ligation  Pre-procedure vitals: Blood pressure 126/82, pulse 64, resp. rate 16, weight 203 lb 12.8 oz (92.443 kg), last menstrual period 07/13/2014.   Procedure explained and patient's questions were invited and answered.   Consent form signed.  Pre-procedure medication:  Ativan 1mg  po x 1.  Time given:  1430  Procedure Set-up: Grounding pad located left thigh.  Cautery settings:50 cut/60coagulation.  Suction applied to coated speculum.  Procedure:  Speculum placed with good visualization of the cervix.  Colposcopy performed showing:  acetowhite lesion(s) noted at 10-12 o'clock.  Cervix anesthetized using 2% Xylocaine with 1:100,000units Epinephrine.  10 cc's used. Entire transition zone with 12 x 20 loop in 2 passes.  Specimen(s) placed on cork and labeled for pathology.  Hemostasis obtained with ball cautery and Monsel's solution.  EBL:  15cc  Complications: none.  Pt jittery after procedure.  Monitored about 30 minutes.  Post procedure BP was 120/82.    Patient tolerated procedure well and left the office in satisfactory condition.  Assessment:  Persistent abnormal Pap smear with CIN1   Plan:  After visit summary given.  Repeat pap planned pending pathology results.  Most likely repeat Pap and HR HPV 1 year.

## 2014-08-29 ENCOUNTER — Telehealth: Payer: Self-pay | Admitting: Obstetrics & Gynecology

## 2014-08-29 NOTE — Telephone Encounter (Signed)
Spoke with patient. She c/o of one episode of vaginal discharge today, describes as light brown, "kind of like a rubber band" in consistency.  Patient denies any further complaints, no fevers, no vaginal bleeding, no abdominal pain.  Patient reports that she is having "some light cramping" today, using 200 mg Motrin prn while at work, any more makes pt sleepy.  Patient rested over the weekend and today, back at work. Has increased activity. Feels cramping r/t activity.   Advised patient that discharge post leep is normal. Particulate discharge normal as well. This discharge is from medication used during procedure. Patient is agreeable to this. She will monitor symptoms and call back if any worsens. Patient will monitor for fever, increased discharge, heavy vaginal bleeding or abdominal pain. Advised would have Dr. Hyacinth MeekerMiller review message and if any further instructions, will return call to patient. She is agreeable to this.

## 2014-08-29 NOTE — Telephone Encounter (Signed)
Pt says that she had a leep on Friday and had some funny discharge today.

## 2014-08-29 NOTE — Telephone Encounter (Signed)
I agree with recommendations.  OK to close encounter.

## 2014-08-30 LAB — IPS OTHER TISSUE BIOPSY

## 2014-09-11 ENCOUNTER — Encounter: Payer: Self-pay | Admitting: Certified Nurse Midwife

## 2014-10-16 ENCOUNTER — Telehealth: Payer: Self-pay | Admitting: Obstetrics & Gynecology

## 2014-10-16 NOTE — Telephone Encounter (Signed)
Patient calling stating she is having some symptoms she thinks may be related to her LEEP procedure. She is hoping to schedule an appointment with Dr. Hyacinth MeekerMiller.

## 2014-10-16 NOTE — Telephone Encounter (Signed)
Spoke with patient. Appointment scheduled for 12/11 at 1pm (time per Kennon RoundsSally). Patient is agreeable to date and time. Advised if symptoms worsen, develops fever, chills, or abdominal pain will need to be seen sooner for evaluation. Patient is agreeable.  Routing to provider for final review. Patient agreeable to disposition. Will close encounter

## 2014-10-16 NOTE — Telephone Encounter (Signed)
Spoke with patient. Patient states that since her LEEP on 08/25/14 she has been "very dry." "It is very painful. I am not able to have sex with my husband at all. It has never been like this before. I don't feel fresh. I am still having discharge that is dark tan and musty." Patient has had cycle since LEEP. Denies any discomfort, fevers, and chills. Patient would like to come in to see Dr.Miller. 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 Advised would speak with provider and return call to get her scheduled. Patient is agreeable.

## 2014-10-20 ENCOUNTER — Ambulatory Visit (INDEPENDENT_AMBULATORY_CARE_PROVIDER_SITE_OTHER): Payer: BC Managed Care – PPO | Admitting: Obstetrics & Gynecology

## 2014-10-20 ENCOUNTER — Encounter: Payer: Self-pay | Admitting: Obstetrics & Gynecology

## 2014-10-20 VITALS — BP 128/82 | HR 72 | Ht 67.5 in | Wt 202.0 lb

## 2014-10-20 DIAGNOSIS — N760 Acute vaginitis: Secondary | ICD-10-CM

## 2014-10-20 DIAGNOSIS — A499 Bacterial infection, unspecified: Secondary | ICD-10-CM

## 2014-10-20 DIAGNOSIS — B9689 Other specified bacterial agents as the cause of diseases classified elsewhere: Secondary | ICD-10-CM

## 2014-10-20 MED ORDER — METRONIDAZOLE 0.75 % VA GEL: 1.0000 | Freq: Every day | VAGINAL | Status: DC

## 2014-10-20 MED ORDER — FLUCONAZOLE 150 MG PO TABS: 150.0000 mg | ORAL_TABLET | Freq: Once | ORAL | Status: DC

## 2014-10-20 NOTE — Progress Notes (Signed)
Subjective:     Patient ID: Michaela Hanson, female   DOB: 09/20/1966, 48 y.o.   MRN: [id number]  HPI  48 yo G2P2 MAAF here for complaint of vaginal dryness/tightness, vaginal discharge and odor that she feels like has been persistent since she had her LEEP.  Pt reports some pain with intercourse.  Denies new sexual partner.  No bowel or bladder issues.  LMP 09/21/14.  Pt reports was "normal".    Review of Systems     Objective:   Physical Exam  Constitutional: She is oriented to person, place, and time. She appears well-developed and well-nourished.  Abdominal: Soft. Bowel sounds are normal.  Genitourinary: There is no rash, tenderness or lesion on the right labia. There is no rash, tenderness or lesion on the left labia. Vaginal discharge found.  Cervix is well healed.  Lymphadenopathy:       Right: No inguinal adenopathy present.       Left: No inguinal adenopathy present.  Neurological: She is alert and oriented to person, place, and time.  Skin: Skin is warm and dry.  Psychiatric: She has a normal mood and affect.   Wet smear:  Ph 4.5  Saline: +WBCs, clue cells, neg trich  KOH: no yeast    Assessment:     Bacterial vaginosis     Plan:     Metrogel 0.75% qhs x 5 nights.  Pt has nausea with oral metronidazole but not metrogel.  Diflucan 15272m po x 1, repeat 48 hours if needed for yeast.  Risk of yeast with metrogel discussed.  Pt knows to call back if symptoms do not resolve.

## 2014-12-21 ENCOUNTER — Ambulatory Visit: Payer: Self-pay | Admitting: Dietician

## 2015-01-16 ENCOUNTER — Ambulatory Visit: Payer: Self-pay | Admitting: Dietician

## 2015-01-16 ENCOUNTER — Ambulatory Visit (INDEPENDENT_AMBULATORY_CARE_PROVIDER_SITE_OTHER): Payer: BC Managed Care – PPO

## 2015-01-16 ENCOUNTER — Ambulatory Visit (INDEPENDENT_AMBULATORY_CARE_PROVIDER_SITE_OTHER): Payer: BC Managed Care – PPO | Admitting: Obstetrics & Gynecology

## 2015-01-16 ENCOUNTER — Telehealth: Payer: Self-pay | Admitting: Obstetrics & Gynecology

## 2015-01-16 VITALS — BP 132/82 | HR 64 | Resp 16 | Wt 214.2 lb

## 2015-01-16 DIAGNOSIS — N921 Excessive and frequent menstruation with irregular cycle: Secondary | ICD-10-CM

## 2015-01-16 DIAGNOSIS — N938 Other specified abnormal uterine and vaginal bleeding: Secondary | ICD-10-CM

## 2015-01-16 LAB — CBC WITH DIFFERENTIAL/PLATELET
Basophils Absolute: 0 10*3/uL (ref 0.0–0.1)
Basophils Relative: 1 % (ref 0–1)
Eosinophils Absolute: 0 10*3/uL (ref 0.0–0.7)
Eosinophils Relative: 1 % (ref 0–5)
HCT: 35.4 % — ABNORMAL LOW (ref 36.0–46.0)
Hemoglobin: 11.9 g/dL — ABNORMAL LOW (ref 12.0–15.0)
Lymphocytes Relative: 48 % — ABNORMAL HIGH (ref 12–46)
Lymphs Abs: 1.8 10*3/uL (ref 0.7–4.0)
MCH: 30.8 pg (ref 26.0–34.0)
MCHC: 33.6 g/dL (ref 30.0–36.0)
MCV: 91.7 fL (ref 78.0–100.0)
MPV: 10.3 fL (ref 8.6–12.4)
Monocytes Absolute: 0.3 10*3/uL (ref 0.1–1.0)
Monocytes Relative: 7 % (ref 3–12)
Neutro Abs: 1.6 10*3/uL — ABNORMAL LOW (ref 1.7–7.7)
Neutrophils Relative %: 43 % (ref 43–77)
Platelets: 208 10*3/uL (ref 150–400)
RBC: 3.86 MIL/uL — ABNORMAL LOW (ref 3.87–5.11)
RDW: 14.8 % (ref 11.5–15.5)
WBC: 3.7 10*3/uL — ABNORMAL LOW (ref 4.0–10.5)

## 2015-01-16 LAB — BASIC METABOLIC PANEL
BUN: 12 mg/dL (ref 6–23)
CO2: 26 mEq/L (ref 19–32)
Calcium: 9.3 mg/dL (ref 8.4–10.5)
Chloride: 103 mEq/L (ref 96–112)
Creat: 0.79 mg/dL (ref 0.50–1.10)
Glucose, Bld: 76 mg/dL (ref 70–99)
Potassium: 5 mEq/L (ref 3.5–5.3)
Sodium: 138 mEq/L (ref 135–145)

## 2015-01-16 MED ORDER — NORETHINDRONE ACETATE 5 MG PO TABS: ORAL_TABLET | ORAL | Status: DC

## 2015-01-16 NOTE — Telephone Encounter (Signed)
Spoke with patient. Advised patient I spoke with Dr.Miller who recommends that she come in to be seen today for further evaluation. Patient states "I have another appointment at 4:15pm that I have been waiting on for a month." Advised patient can schedule with NP tomorrow. Advised needs to monitor bleeding and symptoms. If bleeding increases or symptoms worsen will need to be seen at local urgent care or ER. Advised we highly recommend she come in to be seen this afternoon. Patient would like to call about other appointment and requests return call at 3:30pm.

## 2015-01-16 NOTE — Telephone Encounter (Signed)
Returned call to patient. Patient states that she will come in to see Dr.Miller today. Appointment scheduled for today at 4pm due to travel from work to office. Patient is agreeable to date and time.  Routing to provider for final review. Patient agreeable to disposition. Will close encounter

## 2015-01-16 NOTE — Patient Instructions (Addendum)
Aygestin 5mg  tabs.  Take 2 am and pm daily until stopped.  Then decrease to 1 tabs (5mg ) bid for two days and then take one tablet daily.

## 2015-01-16 NOTE — Telephone Encounter (Signed)
Spoke with patient. Patient states that she is on day 14 of her cycle. "I am feeling fatigued and bloated. The first 5 days I was only having clotting when I went to the bathroom and spotting when I wiped. Then I started bleeding like a heavy period and have been ever since." Denies any shortness of breath, weakness, chills, fever, or abdominal discomfort. Patient is changing pad/tampon 3 times a day. "I was having a lot of loose bowel movements the first couple of days of my period but that went away." Patient has had a tubal ligation. Patient had a LEEP in Oct 2015 with Dr.Miller. Advised will need to speak with provider and return call. Patient is agreeable.

## 2015-01-16 NOTE — Telephone Encounter (Signed)
Patient has been on her cycle for 14 days. Patient says she "does not feel good". Last seen 10/20/14.

## 2015-01-16 NOTE — Progress Notes (Signed)
Subjective:     Patient ID: Michaela Hanson, female   DOB: 12/21/1965, 49 y.o.   MRN: [id number]  HPI 8146 G2P2 AAF here for heavy bleeding that started 2/24.  Pt reports this cycle was started really abnormally as she just had clots and only when she was on the toilet but, otherwise, there wasn't much bleeding at all.  This lasted for a few day and then her bleeding increased was more "normal" until last Wed or Thursday (3/3 or 3/4).  Bleeding has been heavy since.  She reports bleeding has a lot of odor to it now, as well.  Is anxious as it doesn't seem to be slowing down at all.  Called for appt and advised to come and be seen.  Pt reports she was seen in Dr. Barnett AbuFurr's office 01/12/15 due to cramping and abdominal pain.  She was acute abdominal series was negative.  Pt reports that they called today and said potassium was high but she was checking into out office when she got the call.  She was told to go there immediately for repeat blood work, although it was drawn several days ago.  Denies light headedness or dizziness.  No SOB.  No palpitations.    Review of Systems  Genitourinary: Positive for vaginal bleeding and menstrual problem.  All other systems reviewed and are negative.      Objective:   Physical Exam  Constitutional: She is oriented to person, place, and time. She appears well-developed and well-nourished.  Cardiovascular: Normal rate and regular rhythm.   Pulmonary/Chest: Effort normal and breath sounds normal.  Abdominal: Soft. Bowel sounds are normal. She exhibits no distension. There is no tenderness. There is no rebound and no guarding.  Genitourinary: Uterus normal. There is no rash, tenderness, lesion or injury on the right labia. There is no rash, tenderness, lesion or injury on the left labia. Cervix exhibits no motion tenderness. Right adnexum displays no mass, no tenderness and no fullness. Left adnexum displays no mass, no tenderness and no fullness. There is bleeding (dark  and heavy) in the vagina.  Lymphadenopathy:       Right: No inguinal adenopathy present.       Left: No inguinal adenopathy present.  Neurological: She is alert and oriented to person, place, and time.  Skin: Skin is warm and dry.  Psychiatric: She has a normal mood and affect.       Assessment:     Menorrhagia with DUB this month Mild anemia today     Plan:     Aygestin 10mg  bid until bleeding stops.  Then decrease to 5mg  bid for two days then 5mg  daily. BMP, CBC with diff pending Pt will follow up in 3-4 weeks

## 2015-01-17 ENCOUNTER — Telehealth: Payer: Self-pay

## 2015-01-17 LAB — HEMOGLOBIN, FINGERSTICK: Hemoglobin, fingerstick: 11.4 g/dL — ABNORMAL LOW (ref 12.0–16.0)

## 2015-01-17 NOTE — Telephone Encounter (Signed)
Please inform pt her electrolytes were all normal--including her potassium level.  She asked yesterday if these could be faxed to her PCP.  Her hb is 11.9, better than the rapid one from yesterday which was 11.4.  I do want her to start a MVI.    Also, how is her bleeding.  She is supposed to be on Aygestin $RemoveBefor eDEID_QvuiTgsFAseiWvBluubVSqybEjHRfOdL$10mgeBeforeDEID_uKjSRuFokvVGaOqKYnNYLPuqJDODpBRK$5mgback in two to three weeks.  Then I will stop the aygestin and transition to micronor to help with her cycles so this doesn't happen again.    Thanks.

## 2015-01-17 NOTE — Telephone Encounter (Signed)
Spoke with patient at time of incoming call. Patient is calling to check on lab results from 3/8 appointment with Dr.Miller for irregular bleeding. Advised will have covering provider review lab work and return call with further recommendations. Patient is agreeable.  Dr.Silva, please review lab work from 3/8 appointment. Thank you.

## 2015-01-17 NOTE — Telephone Encounter (Signed)
Patient had slightly lowe hemoglobin and slightly low white blood cells counts.  Dr. Hyacinth MeekerMiller will need to review and advise of her recommendations.  She is out of the office today.   Cc - Dr. Hyacinth MeekerMiller.

## 2015-01-17 NOTE — Telephone Encounter (Signed)
Spoke with patient. Advised patient of message as seen below from Dr.Miller. Patient is agreeable and verbalizes understanding. Patient states "The bleeding has gotten a lot lighter but still has a the odor." Advised patient to monitor bleeding and if bleeding continues or increases again will need to call. Also to monitor odor and if continues or worsens to give us a call. If any new symptoms develop will need to let Dr.Miller know. Patient is agreeable. How to take Aygestin reviewed. Patient verbalizes understanding. Patient is requesting an afternoon follow up appointment. Advised will need to speak with provider and return call to schedule. Patient is agreeable.

## 2015-01-18 ENCOUNTER — Encounter: Payer: BC Managed Care – PPO | Attending: Internal Medicine | Admitting: Dietician

## 2015-01-18 ENCOUNTER — Encounter: Payer: Self-pay | Admitting: Dietician

## 2015-01-18 VITALS — Ht 68.5 in | Wt 211.2 lb

## 2015-01-18 DIAGNOSIS — Z6831 Body mass index (BMI) 31.0-31.9, adult: Secondary | ICD-10-CM | POA: Insufficient documentation

## 2015-01-18 DIAGNOSIS — E669 Obesity, unspecified: Secondary | ICD-10-CM

## 2015-01-18 DIAGNOSIS — Z713 Dietary counseling and surveillance: Secondary | ICD-10-CM | POA: Diagnosis not present

## 2015-01-18 NOTE — Telephone Encounter (Signed)
Spoke with patient. Follow up appointment scheduled with Dr.Miller for 3/18 at 4pm (date and time per Kennon RoundsSally). Patient is agreeable to date and time.  Routing to provider for final review. Patient agreeable to disposition. Will close encounter

## 2015-01-18 NOTE — Patient Instructions (Addendum)
Plan to use the treadmill for 30 minutes 4 x week. Add protein to breakfast (egg, peanut butter). For lunch and dinner, aim to fill half of your plate with vegetable. Have protein and carbohydrates on 1/4 of your plate each.  Have protein with carbs for snacks (see list) (fruit with peanut butter, crackers with cheese or nuts, [facility] [employer]Protein Bar).  At night try having some chocolate chips with almonds for snack. Try eating meals at the table with no TV. Take 20 minutes to eat. Chew 20-30 x per bite. Put your fork down between bites.  Try seltzer, tea, water, or diet coke (avoid drinking sugar).

## 2015-01-18 NOTE — Progress Notes (Signed)
  Medical Nutrition Therapy:  Appt start time: 1510 end time:  1600.   Assessment:  Primary concerns today: Michaela Hanson is here today since she is in perimenopause and has an irregular cycle. Gained weight in the last 2-3 years after starting Lexapro and hurt her back in 2014 which caused additional inactivity. Gained about 20 lbs and that is what she would like to lose. Has counted calories (1240 kcal) and used MyFitness Pal. Lost about 8 lbs over the holidays. Gets frustrated when she hits plateaus and starts to "eat whatever she want to". Not doing anything now to lose weight.  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 Lives with her husband and kids aren't there regularly since they are older. She does the meal preparation at home and with her children not being home tends to eat out more or picks ups food. Does not miss or skip meals. Eats out about 3 x week.   Probably having more snacks in the afternoon and evening that before. Used to be able to eat these foods and not gain weight. Does not have large portions.   Preferred Learning Style:   No preference indicated   Learning Readiness:   Ready  MEDICATIONS: see list   DIETARY INTAKE:  Usual eating pattern includes 3 meals and 2 snacks per day.  Avoided foods include: salmon, asparagus, brussels sprouts, yogurt, juice    24-hr recall:  B ( AM): cereal (Special K with no sugar) with 1% milk and 1/2 banana or egg whites with [place] bacon on weekend with green tea  Snk ( AM): none - lunch is early  L ( PM): salad or chicken sandwich or broccoli or potato wedges (school lunch) Snk ( PM): really hungry - apple, cheetos, fritos, ritz/saltines crackers or popcorn D ( PM): baked chicken, rice with cream of chicken and green bean, spaghetti or manwich, rice casserole, vegetable soup, steamed cabbage Snk ( PM): ice cream or cookies Beverages: green tea, water, Coke 1-2 x week  Usual physical activity: physical therapy for her back 2 x week,  cortisol shot for her knee and can use the treadmill for 30 minutes (has gone a few times)  Estimated energy needs: 1800 calories 200 g carbohydrates 135 g protein 50 g fat  Progress Towards Goal(s):  In progress.   Nutritional Diagnosis:  [place]-3.3 Overweight/obesity As related to hx of energy dense snacks.  As evidenced by BMI of 31.7.    Intervention:  Nutrition counseling provided. Plan: Plan to use the treadmill for 30 minutes 4 x week. Add protein to breakfast (egg, peanut butter). For lunch and dinner, aim to fill half of your plate with vegetable. Have protein and carbohydrates on 1/4 of your plate each.  Have protein with carbs for snacks (see list) (fruit with peanut butter, crackers with cheese or nuts, [facility] [employer]Protein Bar).  At night try having some chocolate chips with almonds for snack. Try eating meals at the table with no TV. Take 20 minutes to eat. Chew 20-30 x per bite. Put your fork down between bites.  Try seltzer, tea, water, or diet coke (avoid drinking sugar).   Teaching Method Utilized:  Visual Auditory Hands on  Handouts given during visit include:  MyPlate handout  Meal Plan card  15 g CHO Snacks  Barriers to learning/adherence to lifestyle change: none  Demonstrated degree of understanding via:  Teach Back   Monitoring/Evaluation:  Dietary intake, exercise, and body weight in 1 month(s).

## 2015-01-26 ENCOUNTER — Ambulatory Visit (INDEPENDENT_AMBULATORY_CARE_PROVIDER_SITE_OTHER): Payer: BC Managed Care – PPO | Admitting: Obstetrics & Gynecology

## 2015-01-26 ENCOUNTER — Encounter: Payer: Self-pay | Admitting: Obstetrics & Gynecology

## 2015-01-26 VITALS — BP 124/78 | HR 68 | Resp 16 | Wt 207.4 lb

## 2015-01-26 DIAGNOSIS — A499 Bacterial infection, unspecified: Secondary | ICD-10-CM

## 2015-01-26 DIAGNOSIS — N921 Excessive and frequent menstruation with irregular cycle: Secondary | ICD-10-CM | POA: Diagnosis not present

## 2015-01-26 DIAGNOSIS — N760 Acute vaginitis: Secondary | ICD-10-CM

## 2015-01-26 DIAGNOSIS — D509 Iron deficiency anemia, unspecified: Secondary | ICD-10-CM | POA: Diagnosis not present

## 2015-01-26 DIAGNOSIS — B9689 Other specified bacterial agents as the cause of diseases classified elsewhere: Secondary | ICD-10-CM

## 2015-01-26 LAB — CBC WITH DIFFERENTIAL/PLATELET
Basophils Absolute: 0 10*3/uL (ref 0.0–0.1)
Basophils Relative: 1 % (ref 0–1)
Eosinophils Absolute: 0 10*3/uL (ref 0.0–0.7)
Eosinophils Relative: 1 % (ref 0–5)
HCT: 36.1 % (ref 36.0–46.0)
Hemoglobin: 12.1 g/dL (ref 12.0–15.0)
Lymphocytes Relative: 43 % (ref 12–46)
Lymphs Abs: 1.8 10*3/uL (ref 0.7–4.0)
MCH: 30.6 pg (ref 26.0–34.0)
MCHC: 33.5 g/dL (ref 30.0–36.0)
MCV: 91.2 fL (ref 78.0–100.0)
MPV: 10.7 fL (ref 8.6–12.4)
Monocytes Absolute: 0.4 10*3/uL (ref 0.1–1.0)
Monocytes Relative: 9 % (ref 3–12)
Neutro Abs: 1.9 10*3/uL (ref 1.7–7.7)
Neutrophils Relative %: 46 % (ref 43–77)
Platelets: 237 10*3/uL (ref 150–400)
RBC: 3.96 MIL/uL (ref 3.87–5.11)
RDW: 14.7 % (ref 11.5–15.5)
WBC: 4.2 10*3/uL (ref 4.0–10.5)

## 2015-01-26 MED ORDER — FLUCONAZOLE 150 MG PO TABS: 150.0000 mg | ORAL_TABLET | Freq: Once | ORAL | Status: DC

## 2015-01-26 MED ORDER — METRONIDAZOLE 0.75 % VA GEL: 1.0000 | Freq: Every day | VAGINAL | Status: DC

## 2015-01-26 MED ORDER — NORETHINDRONE ACETATE 5 MG PO TABS: ORAL_TABLET | ORAL | Status: DC

## 2015-01-26 NOTE — Progress Notes (Addendum)
Subjective:     Patient ID: Michaela Hanson, female   DOB: 04/07/1966, 49 y.o.   MRN: [id number]  HPI 49 yo G2P2 AAF here for follow up after having episode of significant and heavy vaginal bleeding that started 01/03/15.  Pt was started on aygstin 10mg  bid until bleeding stopped.  Pt reported it took about a week until the bleeding finally and completely stopped.  She is now on only 5mg  daily.  Energy is better.  No having any side effects on the medication.  Complaint of vaginal odor over last few days.  Doesn't have any itching. Wants to know if this is related to the bleeding and if there something she can do about it.  Review of Systems  All other systems reviewed and are negative.      Objective:   Physical Exam  Constitutional: She is oriented to person, place, and time. She appears well-developed and well-nourished.  Abdominal: Soft. Bowel sounds are normal. She exhibits no distension and no mass. There is no tenderness. There is no rebound and no guarding.  Genitourinary: Uterus normal. There is no rash, tenderness, lesion or injury on the right labia. There is no rash, tenderness, lesion or injury on the left labia. Cervix exhibits no motion tenderness and no discharge. Right adnexum displays no mass and no tenderness. Left adnexum displays no mass and no tenderness. No tenderness or bleeding in the vagina. No foreign body around the vagina. Vaginal discharge found.  Lymphadenopathy:       Right: No inguinal adenopathy present.       Left: No inguinal adenopathy present.  Neurological: She is alert and oriented to person, place, and time.  Skin: Skin is warm and dry.  Psychiatric: She has a normal mood and affect.   Wet smear:  Ph 5.0, KOH with no yeast, +whiff.  Saline neg trich, +clue cells    Assessment:     Episode of menorrhagia, improved after aygestin use BV Mild anemia and low WBC ct    Plan:     Metrogel 0.75% nightly to complete full therapy Diflucan 150mg  po x 1,  repeat in 48 hours  Continue Aygestin 5mg  for three more weeks.  Pt will then stop and let me know what cycle is like.  D/w pt transitioning onto micronor but she is not interested in being on a pill right now.  She would like to see if everything just normalizes after her next cycle.

## 2015-01-28 ENCOUNTER — Encounter: Payer: Self-pay | Admitting: Obstetrics & Gynecology

## 2015-02-22 ENCOUNTER — Ambulatory Visit: Payer: BC Managed Care – PPO | Admitting: Dietician

## 2015-03-07 ENCOUNTER — Telehealth: Payer: Self-pay

## 2015-03-07 ENCOUNTER — Ambulatory Visit: Payer: BC Managed Care – PPO | Admitting: Dietician

## 2015-03-07 NOTE — Telephone Encounter (Signed)
-----   Message from Jerene BearsMary S Miller, MD sent at 03/06/2015 10:26 PM EDT ----- Could you call and check since pt should have already had a cycle.  She may be interested in an endometrial ablation, Mirena IUD, or OCP options.  Uterine artery embolization is not an option for her.  We could sent her an IUD and ablation brochure.  Also, could plan a follow up 2-3 months for recheck of cycles and if still having irregular bleeding, could discuss in depth then as well.  Thanks.

## 2015-03-07 NOTE — Telephone Encounter (Signed)
Lmtcb//kn 

## 2015-03-13 NOTE — Telephone Encounter (Signed)
Patient notified of all results. See result note.//kn 

## 2015-03-23 ENCOUNTER — Emergency Department (HOSPITAL_BASED_OUTPATIENT_CLINIC_OR_DEPARTMENT_OTHER)
Admission: EM | Admit: 2015-03-23 | Discharge: 2015-03-23 | Disposition: A | Discharge status: Home / Self Care | Payer: BC Managed Care – PPO | Attending: Emergency Medicine | Admitting: Emergency Medicine

## 2015-03-23 ENCOUNTER — Encounter (HOSPITAL_BASED_OUTPATIENT_CLINIC_OR_DEPARTMENT_OTHER): Payer: Self-pay | Admitting: *Deleted

## 2015-03-23 ENCOUNTER — Emergency Department (HOSPITAL_BASED_OUTPATIENT_CLINIC_OR_DEPARTMENT_OTHER): Payer: BC Managed Care – PPO

## 2015-03-23 DIAGNOSIS — R51 Headache: Secondary | ICD-10-CM | POA: Diagnosis not present

## 2015-03-23 DIAGNOSIS — Z862 Personal history of diseases of the blood and blood-forming organs and certain disorders involving the immune mechanism: Secondary | ICD-10-CM | POA: Insufficient documentation

## 2015-03-23 DIAGNOSIS — Z8669 Personal history of other diseases of the nervous system and sense organs: Secondary | ICD-10-CM | POA: Insufficient documentation

## 2015-03-23 DIAGNOSIS — F419 Anxiety disorder, unspecified: Secondary | ICD-10-CM | POA: Insufficient documentation

## 2015-03-23 DIAGNOSIS — M1990 Unspecified osteoarthritis, unspecified site: Secondary | ICD-10-CM | POA: Insufficient documentation

## 2015-03-23 DIAGNOSIS — R0600 Dyspnea, unspecified: Secondary | ICD-10-CM | POA: Diagnosis not present

## 2015-03-23 DIAGNOSIS — R519 Headache, unspecified: Secondary | ICD-10-CM

## 2015-03-23 DIAGNOSIS — Z79899 Other long term (current) drug therapy: Secondary | ICD-10-CM | POA: Diagnosis not present

## 2015-03-23 DIAGNOSIS — I10 Essential (primary) hypertension: Secondary | ICD-10-CM | POA: Diagnosis present

## 2015-03-23 LAB — CBC WITH DIFFERENTIAL/PLATELET
Basophils Absolute: 0 10*3/uL (ref 0.0–0.1)
Basophils Relative: 0 % (ref 0–1)
Eosinophils Absolute: 0 10*3/uL (ref 0.0–0.7)
Eosinophils Relative: 1 % (ref 0–5)
HCT: 40.9 % (ref 36.0–46.0)
Hemoglobin: 13.9 g/dL (ref 12.0–15.0)
Lymphocytes Relative: 45 % (ref 12–46)
Lymphs Abs: 2.1 10*3/uL (ref 0.7–4.0)
MCH: 30.7 pg (ref 26.0–34.0)
MCHC: 34 g/dL (ref 30.0–36.0)
MCV: 90.3 fL (ref 78.0–100.0)
Monocytes Absolute: 0.3 10*3/uL (ref 0.1–1.0)
Monocytes Relative: 7 % (ref 3–12)
Neutro Abs: 2.3 10*3/uL (ref 1.7–7.7)
Neutrophils Relative %: 47 % (ref 43–77)
Platelets: 209 10*3/uL (ref 150–400)
RBC: 4.53 MIL/uL (ref 3.87–5.11)
RDW: 13.3 % (ref 11.5–15.5)
WBC: 4.7 10*3/uL (ref 4.0–10.5)

## 2015-03-23 LAB — URINALYSIS, ROUTINE W REFLEX MICROSCOPIC
Bilirubin Urine: NEGATIVE
Glucose, UA: NEGATIVE mg/dL
Hgb urine dipstick: NEGATIVE
Ketones, ur: NEGATIVE mg/dL
Leukocytes, UA: NEGATIVE
Nitrite: NEGATIVE
Protein, ur: NEGATIVE mg/dL
Specific Gravity, Urine: 1.005 (ref 1.005–1.030)
Urobilinogen, UA: 0.2 mg/dL (ref 0.0–1.0)
pH: 6 (ref 5.0–8.0)

## 2015-03-23 LAB — BASIC METABOLIC PANEL
Anion gap: 10 (ref 5–15)
BUN: 12 mg/dL (ref 6–20)
CO2: 29 mmol/L (ref 22–32)
Calcium: 10.1 mg/dL (ref 8.9–10.3)
Chloride: 102 mmol/L (ref 101–111)
Creatinine, Ser: 0.75 mg/dL (ref 0.44–1.00)
GFR calc Af Amer: >60 mL/min (ref >60)
GFR calc non Af Amer: >60 mL/min (ref >60)
Glucose, Bld: 88 mg/dL (ref 65–99)
Potassium: 3.5 mmol/L (ref 3.5–5.1)
Sodium: 141 mmol/L (ref 135–145)

## 2015-03-23 LAB — TROPONIN I: Troponin I: <0.03 ng/mL (ref <0.031)

## 2015-03-23 NOTE — ED Notes (Addendum)
Pt c/o h/a  And increased BP x 6 days seen by PMd on Monday for same , new  med started venlafaxine started in April, MD lowered dose but no relief from H/a

## 2015-03-23 NOTE — Discharge Instructions (Signed)

## 2015-03-28 NOTE — ED Provider Notes (Signed)
CSN: [id number]     Arrival date & time 03/23/15  1603 History   First MD Initiated Contact with Patient 03/23/15 1618     Chief Complaint  Patient presents with  . Hypertension     (Consider location/radiation/quality/duration/timing/severity/associated sxs/prior Treatment) HPI   49 year old female with headaches. Gradual onset about a week ago. Relatively constant. No appreciable exacerbating relieving factors. No fevers or chills. No acute visual complaints. No neck pain or neck stiffness. No acute numbness, tingling loss of strength. Patient is concerned about her blood pressure. No prior diagnosis of hypertension. Concerned that her headaches may be medication related. Recently changed from Lexapro to Effexor.  Past Medical History  Diagnosis Date  . Insomnia   . Abnormal Pap smear 8/13    8/13 ASCUS +HPV, 8/14 ASCUS HPV not detected, 06-26-14 ASCUS +HPV HR  . Anxiety   . Anemia   . Hip arthritis     left  . Hypertension    Past Surgical History  Procedure Laterality Date  . Tubal ligation  1998    BTL  . Appendectomy  2005  . Breast cyst excision Left 6/13  . Colposcopy  9/13    CIN1, 9/14 LGSIL   Family History  Problem Relation Age of Onset  . Hypertension Mother   . Hypertension Father   . Breast cancer Sister   . Breast cancer Paternal Aunt    History  Substance Use Topics  . Smoking status: Never Smoker   . Smokeless tobacco: Never Used  . Alcohol Use: No   OB History    Gravida Para Term Preterm AB TAB SAB Ectopic Multiple Living   2 2 2       2      Review of Systems  All systems reviewed and negative, other than as noted in HPI.   Allergies  Flagyl  Home Medications   Prior to Admission medications   Medication Sig Start Date End Date Taking? Authorizing Provider  B Complex Vitamins (B COMPLEX PO) Take by mouth. 10 days before menstrual cycle    Historical Provider, MD  Butalbital-Aspirin-Caffeine (BUTALBITAL-ASA-CAFFEINE PO) Take by mouth  as needed.    Historical Provider, MD  escitalopram (LEXAPRO) 10 MG tablet Take 10 mg by mouth.    Historical Provider, MD  fluconazole (DIFLUCAN) 150 MG tablet Take 1 tablet (150 mg total) by mouth once. Take one tablet.  Repeat in 48 hours if symptoms are not completely resolved. 01/26/15   Jerene BearsMary S Miller, MD  meloxicam (MOBIC) 15 MG tablet Take 15 mg by mouth daily.    Historical Provider, MD  metroNIDAZOLE (METROGEL) 0.75 % vaginal gel Place 1 Applicatorful vaginally at bedtime. 01/26/15   Jerene BearsMary S Miller, MD  Multiple Vitamins-Minerals (MULTIVITAMIN PO) Take by mouth daily.    Historical Provider, MD  norethindrone (AYGESTIN) 5 MG tablet 2 tabs (10mg ) bid until bleeding stops.  Then take 1 tab BID for two days, then 1 tab daily. 01/26/15   Jerene BearsMary S Miller, MD   BP 152/99 mmHg  Pulse 65  Temp(Src) 98.1 F (36.7 C) (Oral)  Resp 17  Ht 5\' 8"  (1.727 m)  Wt 204 lb (92.534 kg)  BMI 31.03 kg/m2  SpO2 100%  LMP 03/16/2015 Physical Exam  Constitutional: She is oriented to person, place, and time. She appears well-developed and well-nourished. No distress.  HENT:  Head: Normocephalic and atraumatic.  Eyes: Conjunctivae are normal. Right eye exhibits no discharge. Left eye exhibits no discharge.  Neck: Neck supple.  No nuchal rigidty  Cardiovascular: Normal rate, regular rhythm and normal heart sounds.  Exam reveals no gallop and no friction rub.   No murmur heard. Pulmonary/Chest: Effort normal and breath sounds normal. No respiratory distress.  Abdominal: Soft. She exhibits no distension. There is no tenderness.  Musculoskeletal: She exhibits no edema or tenderness.  Neurological: She is alert and oriented to person, place, and time. No cranial nerve deficit. She exhibits normal muscle tone. Coordination normal.  Speech clear. Content appropriate. Falls commands. No focal motor deficit. Gait is steady.  Skin: Skin is warm and dry.  Psychiatric: She has a normal mood and affect. Her behavior is  normal. Thought content normal.  Nursing note and vitals reviewed.   ED Course  Procedures (including critical care time) Labs Review Labs Reviewed  CBC WITH DIFFERENTIAL/PLATELET  BASIC METABOLIC PANEL  TROPONIN I  URINALYSIS, ROUTINE W REFLEX MICROSCOPIC    Imaging Review No results found.   EKG Interpretation   Date/Time:  Friday Mar 23 2015 17:01:29 EDT Ventricular Rate:  69 PR Interval:  232 QRS Duration: 86 QT Interval:  396 QTC Calculation: 424 R Axis:   29 Text Interpretation:  Sinus rhythm with 1st degree A-V block Otherwise  normal ECG No old tracing to compare Confirmed by Burley Kopka  MD, Nyles Mitton  ([id number]) on 03/23/2015 5:06:55 PM      MDM   Final diagnoses:  Dyspnea  Nonintractable headache, unspecified chronicity pattern, unspecified headache type    49yf with headhache. Suspect primary HA. Consider emergent secondary causes such as bleed, infectious or mass but doubt. There is no history of trauma. Pt has a nonfocal neurological exam. Afebrile and neck supple. No use of blood thinning medication. Consider ocular etiology such as acute angle closure glaucoma but doubt. Pt denies acute change in visual acuity and eye exam unremarkable. Doubt temporal arteritis given age, no temporal tenderness and temporal artery pulsations palpable. Doubt CO poisoning. No contacts with similar symptoms. Doubt venous thrombosis. Doubt carotid or vertebral arteries dissection.Feel that can be safely discharged, but strict return precautions discussed. Outpt fu.     Raeford RazorStephen Crissie Aloi, MD 03/28/15 [id number]

## 2015-04-30 ENCOUNTER — Ambulatory Visit (INDEPENDENT_AMBULATORY_CARE_PROVIDER_SITE_OTHER): Payer: BC Managed Care – PPO | Admitting: Obstetrics & Gynecology

## 2015-04-30 VITALS — BP 108/66 | Resp 18 | Ht 68.0 in | Wt 201.2 lb

## 2015-04-30 DIAGNOSIS — B372 Candidiasis of skin and nail: Secondary | ICD-10-CM | POA: Diagnosis not present

## 2015-04-30 DIAGNOSIS — N938 Other specified abnormal uterine and vaginal bleeding: Secondary | ICD-10-CM

## 2015-04-30 MED ORDER — NYSTATIN 100000 UNIT/GM EX CREA: 1.0000 "application " | TOPICAL_CREAM | Freq: Two times a day (BID) | CUTANEOUS | Status: DC

## 2015-04-30 NOTE — Progress Notes (Signed)
Patient ID: Michaela Hanson, female   DOB: 04/15/1966, 49 y.o.   MRN: [id number]  80 G2P2 MAAF here for follow up after having episode of menorrhagia in march.  Ultrasound was performed showing uterus with 3.8 x 3.4 x 3.6cm fibroid and 1.4 x 0.8 x 1.1cm fibroid.  Larger fibroid appears to mildly distort the endometrium.  Endometrium was thin at 3.59mm.  Normal right ovary noted.  Left ovary had a 3.6x 2.8cm simple cyst.  Pt really was bleeding at initial visit.  With thin endometrium, decided to not proceed with biopsy and start aygestin.  This was slowly tapered down after bleeding improved.  Bleeding completely stopped after a week.  D/w pt transitioning to micronor but she wanted to see what would happen when she stopped the Aygestin.  Since then, she reports her cycles have been back to normal.  They are regular and flow is lasting about 5 days.  First day or two are heavier but this is how it has always been.  Does want to discuss treatment options if this occurs again.  Given age, endometrial ablation and myomectomy vs hysterectomy are options.  As well micronor or depo provera are options.  As endometrium is somewhat distorted due to fibroids, placement of IUD may be a little more difficult so if pt desired this, I would recommend under u/s guidance.  Pt voices clear understanding.  At this time, I would not recommend anything, however, as cycles have improved.  Pt in agreement.  Pt has seen a nutritionist and is working on weight loss.  She is exercising more regularly and very happy with 10 pounds she has lost.  Having some skin yeast issues due to exercise and requests recommendations.  Assessment:  Episode of DUB that resolved after Aygestin use Uterine fibroids Skin candida  Plan:  Nystatin cream rx to pharmacy.  Instructions provided. Treatment options discussed.  For now, will wait.  Pt knows to call if has another cycle. AEX in 10/16.  This is scheduled.  ~20 minutes spent with patient >50%  of time was in face to face discussion of above.

## 2015-05-07 DIAGNOSIS — F411 Generalized anxiety disorder: Secondary | ICD-10-CM | POA: Insufficient documentation

## 2015-05-07 DIAGNOSIS — G43909 Migraine, unspecified, not intractable, without status migrainosus: Secondary | ICD-10-CM | POA: Insufficient documentation

## 2015-05-11 ENCOUNTER — Encounter: Payer: Self-pay | Admitting: Obstetrics & Gynecology

## 2015-06-19 ENCOUNTER — Ambulatory Visit: Payer: BC Managed Care – PPO | Admitting: Certified Nurse Midwife

## 2015-07-03 DIAGNOSIS — L669 Cicatricial alopecia, unspecified: Secondary | ICD-10-CM | POA: Insufficient documentation

## 2015-08-24 ENCOUNTER — Encounter: Payer: Self-pay | Admitting: Obstetrics & Gynecology

## 2015-08-24 ENCOUNTER — Ambulatory Visit: Payer: BC Managed Care – PPO | Admitting: Obstetrics & Gynecology

## 2015-08-24 ENCOUNTER — Ambulatory Visit (INDEPENDENT_AMBULATORY_CARE_PROVIDER_SITE_OTHER): Payer: BC Managed Care – PPO | Admitting: Obstetrics & Gynecology

## 2015-08-24 VITALS — BP 116/72 | HR 80 | Resp 16 | Ht 68.5 in | Wt 195.0 lb

## 2015-08-24 DIAGNOSIS — N760 Acute vaginitis: Secondary | ICD-10-CM | POA: Diagnosis not present

## 2015-08-24 DIAGNOSIS — Z124 Encounter for screening for malignant neoplasm of cervix: Secondary | ICD-10-CM

## 2015-08-24 DIAGNOSIS — Z01419 Encounter for gynecological examination (general) (routine) without abnormal findings: Secondary | ICD-10-CM

## 2015-08-24 DIAGNOSIS — A499 Bacterial infection, unspecified: Secondary | ICD-10-CM | POA: Diagnosis not present

## 2015-08-24 DIAGNOSIS — N898 Other specified noninflammatory disorders of vagina: Secondary | ICD-10-CM

## 2015-08-24 DIAGNOSIS — N870 Mild cervical dysplasia: Secondary | ICD-10-CM | POA: Diagnosis not present

## 2015-08-24 DIAGNOSIS — B9689 Other specified bacterial agents as the cause of diseases classified elsewhere: Secondary | ICD-10-CM

## 2015-08-24 MED ORDER — CLINDAMYCIN PHOSPHATE 2 % VA CREA: 1.0000 | TOPICAL_CREAM | Freq: Every day | VAGINAL | Status: DC

## 2015-08-24 NOTE — Patient Instructions (Signed)
Ok to douche with mixture of hydrogen peroxide and water, mixed 1:1.  Ok to use twice weekly.

## 2015-08-24 NOTE — Progress Notes (Signed)
49 y.o. [id number] MarriedAfrican AmericanF here for annual exam.  Reports cycles are now about every five weeks.  Flow is only last 3-4 days.  Reports she has a vaginal discharge that she just can't seem to get rid of.  There is odor.  Denies itching.  Denies any STD risks.  Patient's last menstrual period was 07/17/2015.          Sexually active: Yes.    The current method of family planning is tubal ligation.    Exercising: Yes.    cardio 3 x weekly Smoker:  no  Health Maintenance: Pap:  06/26/14 ASCUS/positive HR HPV History of abnormal Pap:  Yes 07/20/14 LSIL in ECC, 08/25/14 LEEP-mild dysplasia/negative margins MMG:  7/16-[facility] Colonoscopy:  none BMD:   none TDaP:  7/16 Screening Labs: PCP, Hb today: PCP, Urine today: PCP   reports that she has never smoked. She has never used smokeless tobacco. She reports that she does not drink alcohol or use illicit drugs.  Past Medical History  Diagnosis Date  . Insomnia   . Abnormal Pap smear 8/13    8/13 ASCUS +HPV, 8/14 ASCUS HPV not detected, 06-26-14 ASCUS +HPV HR  . Anxiety   . Anemia   . Hip arthritis     left  . Hypertension     Past Surgical History  Procedure Laterality Date  . Tubal ligation  1998    BTL  . Appendectomy  2005  . Breast cyst excision Left 6/13  . Colposcopy  9/13    CIN1, 9/14 LGSIL    Current Outpatient Prescriptions  Medication Sig Dispense Refill  . B Complex Vitamins (B COMPLEX PO) Take by mouth. 10 days before menstrual cycle    . Butalbital-Aspirin-Caffeine (BUTALBITAL-ASA-CAFFEINE PO) Take by mouth as needed.    Marland Kitchen. escitalopram (LEXAPRO) 10 MG tablet Take 10 mg by mouth.    . meloxicam (MOBIC) 15 MG tablet Take 15 mg by mouth daily.    . Multiple Vitamins-Minerals (MULTIVITAMIN PO) Take by mouth daily.    . hydrOXYzine (ATARAX/VISTARIL) 25 MG tablet Take half to one tablet po prn breakthrough anxiety    . nystatin cream (MYCOSTATIN) Apply 1 application topically 2  (two) times daily. Apply to affected area BID for up to 7 days. (Patient not taking: Reported on 08/24/2015) 30 g 0   No current facility-administered medications for this visit.    Family History  Problem Relation Age of Onset  . Hypertension Mother   . Hypertension Father   . Breast cancer Sister   . Breast cancer Paternal Aunt     ROS:  Pertinent items are noted in HPI.  Otherwise, a comprehensive ROS was negative.  Exam:   BP 116/72 mmHg  Pulse 80  Resp 16  Ht 5' 8.5" (1.74 m)  Wt 195 lb (88.451 kg)  BMI 29.21 kg/m2  LMP 07/17/2015  Weight change: -5#  Height: 5' 8.5" (174 cm)  Ht Readings from Last 3 Encounters:  08/24/15 5' 8.5" (1.74 m)  04/30/15 5\' 8"  (1.727 m)  03/23/15 5\' 8"  (1.727 m)    General appearance: alert, cooperative and appears stated age Head: Normocephalic, without obvious abnormality, atraumatic Neck: no adenopathy, supple, symmetrical, trachea midline and thyroid normal to inspection and palpation Lungs: clear to auscultation bilaterally Breasts: normal appearance, no masses or tenderness Heart: regular rate and rhythm Abdomen: soft, non-tender; bowel sounds normal; no masses,  no organomegaly Extremities: extremities normal, atraumatic, no cyanosis or edema Skin: Skin color,  texture, turgor normal. No rashes or lesions Lymph nodes: Cervical, supraclavicular, and axillary nodes normal. No abnormal inguinal nodes palpated Neurologic: Grossly normal   Pelvic: External genitalia:  no lesions              Urethra:  normal appearing urethra with no masses, tenderness or lesions              Bartholins and Skenes: normal                 Vagina: normal appearing vagina with normal color and discharge, no lesions              Cervix: no lesions              Pap taken: Yes.   Bimanual Exam:  Uterus:  normal size, contour, position, consistency, mobility, non-tender              Adnexa: normal adnexa and no mass, fullness, tenderness                Rectovaginal: Confirms               Anus:  normal sphincter tone, no lesions  Wet smear:  Ph: 5.0.  KOH:  + Whiff, no yeast.  Saline: +clue cells, no Trich.  Chaperone was present for exam.  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  Has not occurred again. Recurrent BV  P:  Mammogram yearly Pap and HR HPV today Clindamycin cream 2%, one applicator full qhs until cream is used.  Pt has metronidazole allergy. Hydrogen peroxide/water douching discussed after treatment.   Labs with PCP AEX 1 year if pap is normal.  Appt will be made after Pap results are back.

## 2015-08-28 LAB — IPS PAP TEST WITH REFLEX TO HPV

## 2015-08-30 NOTE — Addendum Note (Signed)
Addended by: Jerene BearsMILLER, Saylah Ketner S on: 08/30/2015 03:22 PM   Modules accepted: Orders, SmartSet

## 2015-08-31 LAB — IPS HPV ON A LIQUID BASED SPECIMEN

## 2015-09-04 ENCOUNTER — Telehealth: Payer: Self-pay | Admitting: Emergency Medicine

## 2015-09-04 DIAGNOSIS — R87810 Cervical high risk human papillomavirus (HPV) DNA test positive: Secondary | ICD-10-CM

## 2015-09-04 NOTE — Telephone Encounter (Signed)
Patient notified of message from Dr. Hyacinth MeekerMiller.  She is agreeable to scheduling colposcopy. Scheduled for this Friday 09/07/15 at 1530 with Dr. Hyacinth MeekerMiller  Brief description of procedure given to patient.  Colposcopy pre-procedure instructions given. Discussed menses and need to not have any bleeding on day of appointment, advised to call to reschedule if starts cycle. Patient with history of BTL, she states cycle was one month ago, but understands to call if starts her cycle prior to appointment.  Make sure to eat a meal and hydrate before appointment.  Advised 800 mg of Motrin PO with food one hour prior to appointment.    Patient verbalized understanding of preprocedure instructions and  will call to reschedule if will be on menses or has any concerns regarding pregnancy.  Patient is advised she will be contacted with insurance coverage information. cc Lilyan GilfordBecky Frahm  Routing to provider for final review. Patient agreeable to disposition. Will close encounter.

## 2015-09-04 NOTE — Telephone Encounter (Signed)
Message left to return call to Dixonvilleracy at [phone].   Patient with history of BTL.  Colposcopy order pended.

## 2015-09-04 NOTE — Telephone Encounter (Signed)
-----   Message from Jerene BearsMary S Miller, MD sent at 09/01/2015  7:01 AM EDT ----- Please inform pt Pap was normal but HR HPV still positive.  Needs repeat colposcopy.

## 2015-09-06 ENCOUNTER — Telehealth: Payer: Self-pay | Admitting: Obstetrics & Gynecology

## 2015-09-06 NOTE — Telephone Encounter (Signed)
Patient calling with questions for nurse.

## 2015-09-06 NOTE — Telephone Encounter (Signed)
Spoke with patient. Patient states she will need to cancel her appointment for her colposcopy tomorrow due to work. Appointment cancelled. Patient is asking if she will have a chance to talk to Dr.Miller prior to proceeding with the colposcopy. Advised she will have time to speak with Dr.Miller the day of the appointment to have all of her questions answered. Patient is agreeable and verbalizes understanding. Patient is currently on here cycle. History of BTL. Aware to call if she having any bleeding leading up to appointment. Appointment for colposcopy rescheduled to September 21, 2015 at 3:30 pm with Dr.Miller. Patient is agreeable to date and time.  Instructions given. Motrin 800 mg po x , one hour before appointment with food. Make sure to eat a meal before appointment and drink plenty of fluids. P  Routing to provider for final review. Patient agreeable to disposition. Will close encounter.

## 2015-09-07 ENCOUNTER — Ambulatory Visit: Payer: BC Managed Care – PPO | Admitting: Obstetrics & Gynecology

## 2015-09-19 ENCOUNTER — Telehealth: Payer: Self-pay | Admitting: Obstetrics & Gynecology

## 2015-09-19 NOTE — Telephone Encounter (Signed)
Spoke with patient. Reviewed benefit for procedure scheduled 09/21/15. Patient understood and agreeable. Reviewed arrival date/time. Patient agreeable. No further questions. Ok to close.

## 2015-09-21 ENCOUNTER — Ambulatory Visit (INDEPENDENT_AMBULATORY_CARE_PROVIDER_SITE_OTHER): Payer: BC Managed Care – PPO | Admitting: Obstetrics & Gynecology

## 2015-09-21 VITALS — BP 110/62 | HR 80 | Resp 16 | Wt 193.0 lb

## 2015-09-21 DIAGNOSIS — N870 Mild cervical dysplasia: Secondary | ICD-10-CM

## 2015-09-21 DIAGNOSIS — Z9889 Other specified postprocedural states: Secondary | ICD-10-CM

## 2015-09-21 DIAGNOSIS — R87810 Cervical high risk human papillomavirus (HPV) DNA test positive: Secondary | ICD-10-CM

## 2015-09-21 NOTE — Progress Notes (Signed)
Subjective:     Patient ID: Michaela Hanson, female   DOB: 10/23/1966, 49 y.o.   MRN: [id number]  HPI 49 yo G2P2 MAAF here for colposcopy due to having a normal pap with +HR HPV 1 year after having a LEEP for recurrent abnormal pap smears for >two years.  Pt frustrated with presence of HPV.  Makes her feel "dirty".  Reviewed with pt frequency of disease.  Listened to her frustrations.  Discussed possible future options.  Her hx is as follows: 8/13 Pap ASCUS with +HR HPV 9/13 Colposcopy with biopsy showing CIN1 and HPV, ECC negative 9/14 Pap ASCUS with neg HR HPV 8/15 PAP ASCUS with +HR HPV 07/20/14 Colposcopy with CIN 1 and ECC with CIN 1 08/15/14 LEEP CIN 1 with neg margins 08/24/15 Pap WNL with +HR HPV  Review of Systems  All other systems reviewed and are negative.      Objective:   Physical Exam  Constitutional: She appears well-developed and well-nourished.  Genitourinary: Vagina normal. There is no rash, tenderness, lesion or injury on the right labia. There is no rash, tenderness, lesion or injury on the left labia.    Lymphadenopathy:       Right: No inguinal adenopathy present.       Left: No inguinal adenopathy present.   Speculum placed.  3% acetic acid applied to cervix for >45 seconds.  Cervix visualized with both 7.5X and 15X magnification.  Green filter also used.  Lugols solution was used.  Findings:  No AWE and no decreased staining with Lugol's solution.  Biopsy:  none.  ECC:  was not performed.  Monsel's was not needed.  Excellent hemostasis was present.  Pt tolerated procedure well and all instruments were removed.  Findings noted above on picture of cervix.    Assessment:     Normal pap with +HR HPV after LEEP 10/15 for CIN 1 with negative margins     Plan:     Repeat Pap smear six months due to pt's anxiety about current diagnosis.

## 2016-03-07 DIAGNOSIS — S83512A Sprain of anterior cruciate ligament of left knee, initial encounter: Secondary | ICD-10-CM | POA: Insufficient documentation

## 2016-03-07 HISTORY — PX: ARTHROSCOPY WITH ANTERIOR CRUCIATE LIGAMENT (ACL) REPAIR WITH ANTERIOR TIBILIAS GRAFT: SHX6503

## 2016-03-21 ENCOUNTER — Ambulatory Visit: Payer: BC Managed Care – PPO | Admitting: Obstetrics & Gynecology

## 2016-04-08 ENCOUNTER — Ambulatory Visit (INDEPENDENT_AMBULATORY_CARE_PROVIDER_SITE_OTHER): Payer: BC Managed Care – PPO | Admitting: Obstetrics & Gynecology

## 2016-04-08 ENCOUNTER — Encounter: Payer: Self-pay | Admitting: Obstetrics & Gynecology

## 2016-04-08 VITALS — BP 116/78 | HR 76 | Resp 16 | Wt 206.8 lb

## 2016-04-08 DIAGNOSIS — N912 Amenorrhea, unspecified: Secondary | ICD-10-CM | POA: Diagnosis not present

## 2016-04-08 DIAGNOSIS — N898 Other specified noninflammatory disorders of vagina: Secondary | ICD-10-CM

## 2016-04-08 DIAGNOSIS — IMO0002 Reserved for concepts with insufficient information to code with codable children: Secondary | ICD-10-CM

## 2016-04-08 DIAGNOSIS — R896 Abnormal cytological findings in specimens from other organs, systems and tissues: Secondary | ICD-10-CM | POA: Diagnosis not present

## 2016-04-08 LAB — PROLACTIN: Prolactin: 5.7 ng/mL

## 2016-04-08 LAB — FOLLICLE STIMULATING HORMONE: FSH: 74.1 m[IU]/mL

## 2016-04-08 LAB — TSH: TSH: 1.95 mIU/L

## 2016-04-08 MED ORDER — METRONIDAZOLE 0.75 % VA GEL: 1.0000 | Freq: Every day | VAGINAL | Status: DC

## 2016-04-08 NOTE — Progress Notes (Signed)
50 y.o. [id number] MarriedAfrican AmericanF here for repeat Pap smear due to history of ASCUS pap and HR HPV.Marland Kitchen.  She has undergone evaluation in the past including colposcopy 09/20/16.  She has undergone the following treatments in the past:  LEEP.  Pap has normalized since then but HR HPV remains positive.    Today she admits to vaginal symptoms of vaginal discharge and odor like when she has BV.  She's had recurrent issues with this.  Also complains of having no cycle for the last three months.  This is accompanied by night sweats and hot flashes.  Mother went into menopause at age 48.  Pt would like evaluation.  Anxious about this. LMP:  01/07/16,  Contraception:BTL.  EXAM: BP 116/78 mmHg  Pulse 76  Resp 16  Wt 206 lb 12.8 oz (93.804 kg)  LMP 01/07/2016 General appearance:  WNWD Female, NAD Abd:  Soft, NT, ND, no masses, hernias, or organomegaly Pelvic exam:  VULVA: normal appearing vulva with no masses, tenderness or lesions VAGINA: normal appearance but greyish, watery discharge present CERVIX: normal appearing cervix without discharge or lesions UTERUS: uterus is normal size, shape, consistency and nontender ADNEXA: normal adnexa in size, nontender and no masses RECTAL: normal rectal, no masses  PAP: Pap smear done today   Assessment: History of ASCUS with +HR HPV, h/o LEEP  Amenorrhea x last three months H/O recurrent BV.  Symptoms today.  Plan: Repeat Pap 6 months unless otherwise indicated by results. Discussed importance of follow up as indicated. Questions addressed. TSH, FSH, prolactin Metrogel 0.75% one applicator full qhs x 5 day.  Rx to pharmacy.

## 2016-04-09 LAB — IPS PAP SMEAR ONLY

## 2016-04-17 ENCOUNTER — Telehealth: Payer: Self-pay

## 2016-04-17 NOTE — Telephone Encounter (Signed)
-----   Message from Jerene BearsMary S Miller, MD sent at 04/16/2016  9:33 AM EDT ----- Inform pap normal.  Needs pap and HR HPV at AEX.  AEX is already scheduled.  Ok to remove from current recall.  Make sure 08 recall is present for AEX.  Thanks.  FSH is in menopausal range.  OTC products pt can use are Estroven or black cohosh if needed for hot flashes or night sweats.  Prolactin and TSH normal.

## 2016-04-17 NOTE — Telephone Encounter (Signed)
Spoke with patient. Advised of message and results as seen below from Dr.Miller. She is agreeable and verbalizes understanding. Removed from current recall. 08 recall for present aex entered. Aex scheduled for 09/08/2016. Patient will try OTC products for hot flashes and night sweat. She will return call to the office if symptoms are not relieved.   Routing to provider for final review. Patient agreeable to disposition. Will close encounter.

## 2016-04-23 DIAGNOSIS — L811 Chloasma: Secondary | ICD-10-CM | POA: Insufficient documentation

## 2016-09-08 ENCOUNTER — Encounter: Payer: Self-pay | Admitting: Obstetrics & Gynecology

## 2016-09-08 ENCOUNTER — Ambulatory Visit (INDEPENDENT_AMBULATORY_CARE_PROVIDER_SITE_OTHER): Payer: BC Managed Care – PPO | Admitting: Obstetrics & Gynecology

## 2016-09-08 VITALS — BP 116/80 | HR 60 | Resp 14 | Ht 67.75 in | Wt 211.4 lb

## 2016-09-08 DIAGNOSIS — Z124 Encounter for screening for malignant neoplasm of cervix: Secondary | ICD-10-CM

## 2016-09-08 DIAGNOSIS — Z01419 Encounter for gynecological examination (general) (routine) without abnormal findings: Secondary | ICD-10-CM | POA: Diagnosis not present

## 2016-09-08 MED ORDER — ESTRADIOL 0.1 MG/GM VA CREA: TOPICAL_CREAM | VAGINAL | 1 refills | Status: DC

## 2016-09-08 NOTE — Patient Instructions (Signed)
Contrave

## 2016-09-08 NOTE — Progress Notes (Signed)
50 y.o. [id number] MarriedAfrican AmericanF here for annual exam.  Reports cycles are much less frequent this year.  Has only had 3 cycles.  Flow lasted 5 days and flow wasn't heavy.  Reports she is having increased dryness and pain with intercourse.    Patient's last menstrual period was 08/20/2016 (exact date).          Sexually active: Yes.    The current method of family planning is tubal ligation.    Exercising: Yes.    walking 3-4x a week Smoker:  no  Health Maintenance: Pap: 04/08/16 negative, 08/27/15 negative, HR HPV +  History of abnormal Pap:  yes MMG:  06/05/16 BIRADS 2 benign.  Reviewed in Care Everywhere Colonoscopy:  2005  BMD:   none TDaP:  05/30/15 with Dr. Shelle IronBeane.  Reviewed in CareEverywhere Pneumonia vaccine(s):  never Zostavax:   never Hep C testing: not indicated  Screening Labs: PCP, Hb today: PCP, Urine today: PCP   reports that she has never smoked. She has never used smokeless tobacco. She reports that she does not drink alcohol or use drugs.  Past Medical History:  Diagnosis Date  . Abnormal Pap smear 8/13   8/13 ASCUS +HPV, 8/14 ASCUS HPV not detected, 06-26-14 ASCUS +HPV HR  . Anemia   . Anxiety   . Hip arthritis    left  . Hypertension   . Insomnia     Past Surgical History:  Procedure Laterality Date  . APPENDECTOMY  2005  . ARTHROSCOPY WITH ANTERIOR CRUCIATE LIGAMENT (ACL) REPAIR WITH ANTERIOR TIBILIAS GRAFT  03/07/2016    Dr. Thamas JaegersLennon  . BREAST CYST EXCISION Left 6/13  . 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 TUBAL LIGATION  1998   BTL    Current Outpatient Prescriptions  Medication Sig Dispense Refill  . B Complex Vitamins (B COMPLEX PO) Take by mouth. 10 days before menstrual cycle    . Butalbital-Aspirin-Caffeine (BUTALBITAL-ASA-CAFFEINE PO) Take by mouth as needed.    . clobetasol ointment (TEMOVATE) 0.05 % Apply to affected areas daily for one week, then 4 times per weeks. Not to face.    . escitalopram (LEXAPRO) 10 MG tablet Take 15 mg by  mouth.    . hydrOXYzine (ATARAX/VISTARIL) 25 MG tablet Take half to one tablet po prn breakthrough anxiety    . ibuprofen (ADVIL,MOTRIN) 600 MG tablet Take 600 mg by mouth.    . meloxicam (MOBIC) 15 MG tablet Take 15 mg by mouth daily.    . Multiple Vitamins-Minerals (MULTIVITAMIN PO) Take by mouth daily.     No current facility-administered medications for this visit.     Family History  Problem Relation Age of Onset  . Hypertension Mother   . Hypertension Father   . Breast cancer Sister   . Breast cancer Paternal Aunt     ROS:  Pertinent items are noted in HPI.  Otherwise, a comprehensive ROS was negative.  Exam:   BP 116/80 (BP Location: Right Arm, Patient Position: Sitting, Cuff Size: Normal)   Pulse 60   Resp 14   Ht 5' 7.75" (1.721 m)   Wt 211 lb 6.4 oz (95.9 kg)   LMP 08/20/2016 (Exact Date)   BMI 32.38 kg/m   Weight change: +16#  Height: 5' 7.75" (172.1 cm)  Ht Readings from Last 3 Encounters:  09/08/16 5' 7.75" (1.721 m)  08/24/15 5' 8.5" (1.74 m)  04/30/15 5\' 8"  (1.727 m)   General appearance: alert, cooperative and appears stated age  Head: Normocephalic, without obvious abnormality, atraumatic Neck: no adenopathy, supple, symmetrical, trachea midline and thyroid normal to inspection and palpation Lungs: clear to auscultation bilaterally Breasts: normal appearance, no masses or tenderness Heart: regular rate and rhythm Abdomen: soft, non-tender; bowel sounds normal; no masses,  no organomegaly Extremities: extremities normal, atraumatic, no cyanosis or edema Skin: Skin color, texture, turgor normal. No rashes or lesions Lymph nodes: Cervical, supraclavicular, and axillary nodes normal. No abnormal inguinal nodes palpated Neurologic: Grossly normal  Pelvic: External genitalia:  no lesions              Urethra:  normal appearing urethra with no masses, tenderness or lesions              Bartholins and Skenes: normal                 Vagina: normal appearing  vagina with normal color and discharge, no lesions              Cervix: no lesions              Pap taken: Yes.   Bimanual Exam:  Uterus:  normal size, contour, position, consistency, mobility, non-tender              Adnexa: normal adnexa and no mass, fullness, tenderness               Rectovaginal: Confirms               Anus:  normal sphincter tone, no lesions  Chaperone was present for exam.  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  Much improved. Decreased libido and vaginal dryness/dyspareunia  P:  Mammogram yearly Pap and HR HPV today Trial of vaginal estrogen cream-estrace 1gm pv twice weekly.  #42.5/1RF. Labs with PCP   AEX 1 year or follow up prn

## 2016-09-11 LAB — IPS PAP TEST WITH HPV

## 2016-09-15 ENCOUNTER — Telehealth: Payer: Self-pay | Admitting: Obstetrics & Gynecology

## 2016-09-15 NOTE — Telephone Encounter (Signed)
Message noted.  Encounter closed.

## 2016-09-15 NOTE — Telephone Encounter (Addendum)
Patient is returning a call to Emily. °

## 2016-09-15 NOTE — Telephone Encounter (Signed)
Patient has listened to her voicemail and no longer needs to speak with the nurse. Please disregard the previous message.

## 2017-02-09 ENCOUNTER — Ambulatory Visit (INDEPENDENT_AMBULATORY_CARE_PROVIDER_SITE_OTHER): Payer: BC Managed Care – PPO | Admitting: Obstetrics & Gynecology

## 2017-02-09 ENCOUNTER — Encounter: Payer: Self-pay | Admitting: Obstetrics & Gynecology

## 2017-02-09 VITALS — BP 128/80 | HR 86 | Resp 12 | Ht 67.75 in | Wt 219.4 lb

## 2017-02-09 DIAGNOSIS — N898 Other specified noninflammatory disorders of vagina: Secondary | ICD-10-CM

## 2017-02-09 DIAGNOSIS — Z72820 Sleep deprivation: Secondary | ICD-10-CM | POA: Diagnosis not present

## 2017-02-09 DIAGNOSIS — Z658 Other specified problems related to psychosocial circumstances: Secondary | ICD-10-CM

## 2017-02-09 DIAGNOSIS — N959 Unspecified menopausal and perimenopausal disorder: Secondary | ICD-10-CM

## 2017-02-09 DIAGNOSIS — R232 Flushing: Secondary | ICD-10-CM | POA: Diagnosis not present

## 2017-02-09 MED ORDER — GABAPENTIN 100 MG PO CAPS: ORAL_CAPSULE | ORAL | 0 refills | Status: DC

## 2017-02-09 NOTE — Patient Instructions (Signed)
Don't take these at the same time: Black cohosh Estroven--PM version

## 2017-02-09 NOTE — Progress Notes (Signed)
GYNECOLOGY  VISIT   HPI: 51 y.o. [id number] Married [place] American female here for complaint of menstrual cycles changing as well as weight gain, hot flashes and over feeling of malaise.    She had five cycles last year and has only had one this year, thus far.  LMP 11/12/16.  Flow was normal and lasted 4-5 days.  Flow wasn't heavy and she did not have any clotting.  She reports it was just "normal".  Since January, she's having issues with sleep at night.  She wakes up hot and throws off the covers.  She is having issues with vaginal dryness.  States she "doesn't feel like herself".  She states that she feels these more severe symptoms started earlier last year but "leveled out" and then were worse again after Thanksgiving.    She also reports she is having some vaginal discharge with sensation of irritation.  She is having some itching as well.  She reports there is significant pain with intercourse and she hasn't had intercourse in over a month due to the pain.    Lastly, she is very frustrated with weight.  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  Feels this is the worst year she's ever had from a working standpoint.  Does desire a school transfer next year and has requested this already.  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  Feels leadership at school is lacking and that she just has the worst students this year with minimal parental input.  Can't wait for the school year to end.  GYNECOLOGIC HISTORY: Patient's last menstrual period was 11/12/2016. Contraception: BTL Menopausal hormone therapy: none  Patient Active Problem List   Diagnosis Date Noted  . ACL (anterior cruciate ligament) rupture 03/07/2016  . Dysplasia of cervix, low grade (CIN 1) 09/21/2015  . H/O LEEP 09/21/2015  . Anxiety, generalized 05/07/2015  . Headache, migraine 05/07/2015  . Abnormal Pap smear of cervix 07/20/2014    Class: Diagnosis of  . Back injury  11/23/2013    Class: Acute    Past Medical History:  Diagnosis Date  . Abnormal Pap smear 8/13   8/13 ASCUS +HPV, 8/14 ASCUS HPV not detected, 06-26-14 ASCUS +HPV HR  . Anemia   . Anxiety   . Hip arthritis    left  . Hypertension   . Insomnia     Past Surgical History:  Procedure Laterality Date  . APPENDECTOMY  2005  . ARTHROSCOPY WITH ANTERIOR CRUCIATE LIGAMENT (ACL) REPAIR WITH ANTERIOR TIBILIAS GRAFT  03/07/2016    Dr. Thamas Jaegers  . BREAST CYST EXCISION Left 6/13  . 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 TUBAL LIGATION  1998   BTL    MEDS:  Reviewed in EPIC and UTD  ALLERGIES: Flagyl [metronidazole]  Family History  Problem Relation Age of Onset  . Hypertension Mother   . Hypertension Father   . Breast cancer Sister 30  . Breast cancer Paternal Aunt 3    died of pancreatic cancer age 60    SH:  Married, non smoker  Review of Systems  Constitutional: Positive for weight loss.  Genitourinary:       Menstrual cycle changes  Psychiatric/Behavioral: The patient is nervous/anxious.     PHYSICAL EXAMINATION:    BP 128/80 (BP Location: Right Arm, Patient Position: Sitting, Cuff Size: Normal)   Pulse 86   Resp 12   Ht 5' 7.75" (1.721 m)  Wt 219 lb 6.4 oz (99.5 kg)   LMP 11/12/2016   BMI 33.61 kg/m     Physical Exam  Constitutional: She is oriented to person, place, and time. She appears well-developed and well-nourished.  Cardiovascular: Normal rate and regular rhythm.   Respiratory: Effort normal and breath sounds normal.  Neurological: She is alert and oriented to person, place, and time.  Skin: Skin is warm and dry.  Psychiatric: She has a normal mood and affect.   Assessment: Hot flashes with sleep disturbance, possibly menopausal Oligomenorrhea Anxiety and work stressors H/O migraines Vaginal irritaiton/dryness, desires testing for yeast  Plan: FSH obtained today.  If in menopausal range, treatment indicated due to symptoms.  With pt's migraine  hx, I feel it is best not to start on HRT if possible.  Would recommend gabapentin  nightly, increasing by  for three weeks to toal of .  Side effects reviewed.  May need to add during the day as well.  Pt desires some herbal remedies as well.  Reviewed these. Affirm pending.  If this is negative, will recommend treatment with vaginal estrogen cream. Will likely need follow up but will await test results from above.   ~30 minutes spent with patient >50% of time was in face to face discussion of above.

## 2017-02-10 LAB — FOLLICLE STIMULATING HORMONE: FSH: 84.2 m[IU]/mL

## 2017-02-10 LAB — WET PREP BY MOLECULAR PROBE
Candida species: NOT DETECTED
Gardnerella vaginalis: NOT DETECTED
Trichomonas vaginosis: NOT DETECTED

## 2017-03-27 ENCOUNTER — Encounter: Payer: Self-pay | Admitting: Obstetrics & Gynecology

## 2017-03-27 ENCOUNTER — Ambulatory Visit (INDEPENDENT_AMBULATORY_CARE_PROVIDER_SITE_OTHER): Payer: BC Managed Care – PPO | Admitting: Obstetrics & Gynecology

## 2017-03-27 VITALS — BP 120/80 | HR 68 | Resp 12 | Ht 67.75 in | Wt 213.6 lb

## 2017-03-27 DIAGNOSIS — N898 Other specified noninflammatory disorders of vagina: Secondary | ICD-10-CM | POA: Diagnosis not present

## 2017-03-27 DIAGNOSIS — N959 Unspecified menopausal and perimenopausal disorder: Secondary | ICD-10-CM

## 2017-03-27 DIAGNOSIS — N941 Unspecified dyspareunia: Secondary | ICD-10-CM | POA: Diagnosis not present

## 2017-03-27 MED ORDER — NYSTATIN 100000 UNIT/GM EX CREA: 1.0000 "application " | TOPICAL_CREAM | Freq: Two times a day (BID) | CUTANEOUS | 1 refills | Status: DC

## 2017-03-27 MED ORDER — CLOBETASOL PROPIONATE 0.05 % EX OINT: TOPICAL_OINTMENT | Freq: Two times a day (BID) | CUTANEOUS | 1 refills | Status: AC

## 2017-03-27 NOTE — Progress Notes (Signed)
GYNECOLOGY  VISIT   HPI: 51 y.o. [id number] Married [place] female here for follow up of menopausal symptoms.  She has been using vaginal estrogen cream and her vaginal discomfort and pain with intercourse has improved significantly.  Pain is not completely resolved but much, much better.  She is using black cohosh and she thinks this has really helped her hot flashes during the day as well.  Her sleep is good.  She is really satisfied with her symptom improvement.  LMP was 11/12/16.    Still frustated with work.  [place] so [place].  Hopes something will happen with this as she feels the stressors of her job this year have been negative for her health.  GYNECOLOGIC HISTORY: Patient's last menstrual period was 11/12/2016. Contraception: BTL Menopausal hormone therapy: vaginal estrogen cream  Patient Active Problem List   Diagnosis Date Noted  . Melasma 04/23/2016  . Left ACL tear 03/07/2016  . Dysplasia of cervix, low grade (CIN 1) 09/21/2015  . H/O LEEP 09/21/2015  . 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 Generalized anxiety disorder 05/07/2015  . Migraine without status migrainosus, not intractable 05/07/2015  . Abnormal Pap smear of cervix 07/20/2014    Class: Diagnosis of  . Back injury 11/23/2013    Class: Acute    Past Medical History:  Diagnosis Date  . Abnormal Pap smear 8/13   8/13 ASCUS +HPV, 8/14 ASCUS HPV not detected, 06-26-14 ASCUS +HPV HR  . Anemia   . Anxiety   . Hip arthritis    left  . Hypertension   . Insomnia   . Migraine headache    with aura (visual changes)    Past Surgical History:  Procedure Laterality Date  . APPENDECTOMY  2005  . ARTHROSCOPY WITH ANTERIOR CRUCIATE LIGAMENT (ACL) REPAIR WITH ANTERIOR TIBILIAS GRAFT  03/07/2016    Dr. Thamas JaegersLennon  . BREAST CYST EXCISION Left 6/13  . 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 TUBAL LIGATION  1998    BTL    MEDS:  Reviewed in EPIC and UTD  ALLERGIES: Flagyl [metronidazole]  Family History  Problem Relation Age of Onset  . Hypertension Mother   . Hypertension Father   . Breast cancer Sister 1944  . Breast cancer Paternal Aunt [phone]       died of pancreatic cancer age 49    SH:  Married, non smoker  Review of Systems  All other systems reviewed and are negative.   PHYSICAL EXAMINATION:    BP 120/80 (BP Location: Right Arm, Patient Position: Sitting, Cuff Size: Normal)   Pulse 68   Resp 12   Ht 5' 7.75" (1.721 m)   Wt 213 lb 9.6 oz (96.9 kg)   LMP 11/12/2016   BMI 32.72 kg/m     General appearance: alert, cooperative and appears stated age Abdomen: soft, non-tender; bowel sounds normal; no masses,  no organomegaly  Pelvic: External genitalia:  no lesions              Urethra:  normal appearing urethra with no masses, tenderness or lesions              Bartholins and Skenes: normal                 Vagina: normal appearing vagina with normal color and discharge, no lesions  Cervix: no lesions              Bimanual Exam:  Uterus:  normal size, contour, position, consistency, mobility, non-tender              Adnexa: no mass, fullness, tenderness              Anus:  no lesions  Chaperone was present for exam.  Assessment: Perimenopausal symptoms of hot flashes and vaginal dryness/dyspareunia, improved with vaginal estrogen and black cohosh  Plan: No additional recommendations are made today.  She does need follow up pap smear at [facility].  This is scheduled for November.

## 2017-06-23 ENCOUNTER — Telehealth: Payer: Self-pay | Admitting: Obstetrics & Gynecology

## 2017-06-23 NOTE — Telephone Encounter (Signed)
Left message to call Sandar Krinke at [phone].  

## 2017-06-23 NOTE — Telephone Encounter (Signed)
Patient called requesting to speak with the nurse. She said she has recently had high blood pressure and that it may be related to her hormones.

## 2017-07-01 NOTE — Telephone Encounter (Signed)
Left message to call Kadence Mikkelson at [phone].  

## 2017-07-07 NOTE — Telephone Encounter (Signed)
Dr. Hyacinth Meeker -attempted to reach patient x2, no return call,  OK to close encounter?

## 2017-07-08 NOTE — Telephone Encounter (Signed)
Yes, ok to close encounter. 

## 2017-07-22 DIAGNOSIS — I10 Essential (primary) hypertension: Secondary | ICD-10-CM | POA: Insufficient documentation

## 2017-08-03 ENCOUNTER — Ambulatory Visit (INDEPENDENT_AMBULATORY_CARE_PROVIDER_SITE_OTHER): Payer: BC Managed Care – PPO | Admitting: Obstetrics & Gynecology

## 2017-08-03 ENCOUNTER — Telehealth: Payer: Self-pay | Admitting: Obstetrics & Gynecology

## 2017-08-03 ENCOUNTER — Encounter: Payer: Self-pay | Admitting: Obstetrics & Gynecology

## 2017-08-03 VITALS — BP 112/70 | HR 72 | Resp 16 | Wt 219.0 lb

## 2017-08-03 DIAGNOSIS — N882 Stricture and stenosis of cervix uteri: Secondary | ICD-10-CM | POA: Diagnosis not present

## 2017-08-03 DIAGNOSIS — N950 Postmenopausal bleeding: Secondary | ICD-10-CM

## 2017-08-03 NOTE — Telephone Encounter (Signed)
Patient last period was in January and she states her period has started again as of Saturday.Would like to speak to a nurse.

## 2017-08-03 NOTE — Telephone Encounter (Signed)
Call to patient. States she is menopausal by lab work in January 2018. Started heavy period with large clots this weekend 08-01-17. Pad changed 4-5 times both sat and Sunday. Had mild cramping, headache and felt really bad.  Bleeding is less today and feels a little better. Has had BTSP. Advised needs office visit and patient agreeable but cannot get in till 420 after school.Advised patient will need to confirm appointment time and call her back.

## 2017-08-03 NOTE — Progress Notes (Signed)
GYNECOLOGY  VISIT  CC:   Postmenopausal bleeding  HPI: 51 y.o. G2P2002 Married [place] female here for complaint of vaginal bleeding that starte70d on Saturday.  Pt did not have any PMS symptoms before this started.  She did have an Michaela Hanson 4/18 that was 84.  She reports the bleeding was heavy on Saturday and Sunday with clots.  Used pads and had to change every 4 hours.  Denies much cramping.  Did not have any other PMS symptoms--no breast tenderness, no acne  GYNECOLOGIC HISTORY: Patient's last menstrual period was 08/01/2017. Contraception: BLT Menopausal hormone therapy: no oral HRT  Patient Active Problem List   Diagnosis Date Noted  . Melasma 04/23/2016  . Left ACL tear 03/07/2016  . Dysplasia of cervix, low grade (CIN 1) 09/21/2015  . H/O LEEP 09/21/2015  . 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 Generalized anxiety disorder 05/07/2015  . Migraine without status migrainosus, not intractable 05/07/2015  . Abnormal Pap smear of cervix 07/20/2014    Class: Diagnosis of  . Back injury 11/23/2013    Class: Acute    Past Medical History:  Diagnosis Date  . Abnormal Pap smear 8/13   8/13 ASCUS +HPV, 8/14 ASCUS HPV not detected, 06-26-14 ASCUS +HPV HR  . Anemia   . Anxiety   . Hip arthritis    left  . Hypertension   . Insomnia   . Migraine headache    with aura (visual changes)    Past Surgical History:  Procedure Laterality Date  . APPENDECTOMY  2005  . ARTHROSCOPY WITH ANTERIOR CRUCIATE LIGAMENT (ACL) REPAIR WITH ANTERIOR TIBILIAS GRAFT  03/07/2016    Dr. Thamas Jaegers  . BREAST CYST EXCISION Left 6/13  . 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 TUBAL LIGATION  1998   BTL    MEDS:   Current Outpatient Prescriptions on File Prior to Visit  Medication Sig Dispense Refill  . Butalbital-Aspirin-Caffeine (BUTALBITAL-ASA-CAFFEINE PO) Take by mouth as needed.    . clobetasol ointment (TEMOVATE) 0.05 % Apply topically 2 (two) times daily. Do not use for more  than 7 days in a row. 30 g 1  . escitalopram (LEXAPRO) 10 MG tablet Take 15 mg by mouth.    . estradiol (ESTRACE) 0.1 MG/GM vaginal cream 1 gram vaginally twice weekly 42.5 g 1  . meloxicam (MOBIC) 15 MG tablet Take 15 mg by mouth daily.    . Multiple Vitamins-Minerals (MULTIVITAMIN PO) Take by mouth daily.    Marland Kitchen nystatin cream (MYCOSTATIN) Apply 1 application topically 2 (two) times daily. Apply to affected area BID for up to 7 days. 30 g 1  . gabapentin (NEURONTIN) 100 MG capsule 1 tab nightly for 7 days.  Increase to 2 tabs nightly for 7 days.  Increase if needed to 3 tabs nightly for 7 days. (Patient not taking: Reported on 08/03/2017) 63 capsule 0   No current facility-administered medications on file prior to visit.     ALLERGIES: Flagyl [metronidazole]  Family History  Problem Relation Age of Onset  . Hypertension Mother   . Hypertension Father   . Breast cancer Sister 61  . Breast cancer Paternal Aunt 21       died of pancreatic cancer age 30    SH:  Married, non smoker  Review of Systems  Constitutional: Negative.   Cardiovascular: Negative.   Gastrointestinal: Negative.   Genitourinary: Negative.     PHYSICAL EXAMINATION:    BP 112/70 (BP Location: Right Arm, Patient  Position: Sitting, Cuff Size: Large)   Pulse 72   Resp 16   Wt 219 lb (99.3 kg)   LMP 08/01/2017   BMI 33.55 kg/m     General appearance: alert, cooperative and appears stated age Abdomen: soft, non-tender; bowel sounds normal; no masses,  no organomegaly  Pelvic: External genitalia:  no lesions              Urethra:  normal appearing urethra with no masses, tenderness or lesions              Bartholins and Skenes: normal                 Vagina: normal appearing vagina with normal color and discharge, no lesions              Cervix: no lesions              Bimanual Exam:  Uterus:  normal size, contour, position, consistency, mobility, non-tender              Adnexa: no mass, fullness, tenderness               Anus: no lesions  Endometrial biopsy recommended.  Discussed with patient.  Verbal and written consent obtained.   Procedure:  Speculum placed.  Cervix visualized and cleansed with betadine prep.  A single toothed tenaculum was applied to the anterior lip of the cervix.  Attempt was made to pass endometrial pipelle through the cervical os.  This was unsuccessful.  This was attempted several more times before attempting to pass a Milex cervical dilator.  This was also unsuccessful.  Due to patient discomfort after several minutes, procedure was ended.  Tenculum removed.  Minimal bleeding noted.  Patient tolerated procedure well.  Chaperone was present for exam.  Assessment: PMP bleeding with FSH of 84 earlier this year  Plan: Due to unsuccessful attempt at biopsy, will obtain [facility] today.  If this is low (and with pt's age), feel can monitor or consider PUS if occurs again.  Pt understands plan and is comfortable with this.

## 2017-08-04 LAB — FOLLICLE STIMULATING HORMONE: FSH: 9.5 m[IU]/mL

## 2017-08-04 NOTE — Telephone Encounter (Signed)
Patient was seen on 08/03/2017 at 4:15 pm with Dr.Miller. See OV note.

## 2017-08-05 ENCOUNTER — Telehealth: Payer: Self-pay | Admitting: Obstetrics & Gynecology

## 2017-08-05 NOTE — Telephone Encounter (Signed)
Spoke with patient, advised of results and recommendations as seen below per Dr. Hyacinth Meeker. Patient verbalizes understanding and is agreeable.  Notes recorded by Jerene Bears, MD on 08/05/2017 at 11:33 AM EDT Please let pt know her FSH was low at 9 showing she is making estrogen so her bleeding was a menstrual cycle. No biopsy needed. I want her to let me know if/when she cycles again. Thanks.    Patient is agreeable to disposition. Will close encounter.

## 2017-08-05 NOTE — Telephone Encounter (Signed)
Patient calling for results.

## 2017-08-26 ENCOUNTER — Telehealth: Payer: Self-pay | Admitting: Obstetrics & Gynecology

## 2017-08-26 DIAGNOSIS — N939 Abnormal uterine and vaginal bleeding, unspecified: Secondary | ICD-10-CM

## 2017-08-26 NOTE — Telephone Encounter (Signed)
Spoke with patient. Advised of message as seen below from Dr.Silva. Patient verbalizes understanding. PUS/possible SHGM scheduled for 08/27/2017 at 2 pm with 2:30 pm consult with Dr.Miller. Patient is agreeable to date and time. Order placed for precert.  Cc: Dr.Miller  Routing to covering provider for final review. Patient agreeable to disposition. Will close encounter.

## 2017-08-26 NOTE — Telephone Encounter (Signed)
Patient last seen on 08/03/2017 for irregular bleeding. FSH at that visit was 9.5. [facility] Reports she has started bleeding again today 08/26/2017. Blood is red in color. Denies any heavy bleeding at this time. "I am having a lot of cramping and bloating." LMP was 08/01/2017. "I am just really uncomfortable." Advised may take 600-800 mg of Ibuprofen/Motrin for cramping. Patient verbalizes understanding. States Dr.Miller advised her to notify her of any future bleeding. Advised will route to Dr.Miller who is out of the office today and covering provider and return call with additional recommendations.

## 2017-08-26 NOTE — Telephone Encounter (Signed)
Patient is calling per triage request to confirm her PUS appointment tomorrow. No need to return her call.

## 2017-08-26 NOTE — Telephone Encounter (Signed)
Patient has started her cycle again and was told to call if this happened.

## 2017-08-26 NOTE — Telephone Encounter (Signed)
Please schedule a pelvic ultrasound/possible sonohysterogram and follow up with Dr. Hyacinth MeekerMiller.  Dr. Hyacinth MeekerMiller wrote in her last office visit note to proceed with ultrasound if this occurred.  The patient has cervical stenosis, so I am not certain if the sonohysterogram will be possible or not.   Cc - Dr. Hyacinth MeekerMiller

## 2017-08-27 ENCOUNTER — Encounter: Payer: Self-pay | Admitting: Obstetrics & Gynecology

## 2017-08-27 ENCOUNTER — Other Ambulatory Visit: Payer: BC Managed Care – PPO

## 2017-08-27 ENCOUNTER — Other Ambulatory Visit: Payer: BC Managed Care – PPO | Admitting: Obstetrics & Gynecology

## 2017-08-27 ENCOUNTER — Other Ambulatory Visit: Payer: Self-pay | Admitting: Obstetrics & Gynecology

## 2017-08-27 ENCOUNTER — Ambulatory Visit (INDEPENDENT_AMBULATORY_CARE_PROVIDER_SITE_OTHER): Payer: BC Managed Care – PPO

## 2017-08-27 ENCOUNTER — Ambulatory Visit (INDEPENDENT_AMBULATORY_CARE_PROVIDER_SITE_OTHER): Payer: BC Managed Care – PPO | Admitting: Obstetrics & Gynecology

## 2017-08-27 VITALS — BP 124/86 | HR 84 | Resp 12 | Wt 219.0 lb

## 2017-08-27 DIAGNOSIS — N939 Abnormal uterine and vaginal bleeding, unspecified: Secondary | ICD-10-CM

## 2017-08-27 DIAGNOSIS — N921 Excessive and frequent menstruation with irregular cycle: Secondary | ICD-10-CM

## 2017-08-27 NOTE — Progress Notes (Signed)
51 y.o. [id number] Married [place] female here for pelvic ultrasound due to irregular and heavy bleeding.  Pt is perimenopausal with fluctuating FSH levels.  FSH has ranged from 9.5 to 84.2 in the last year.  She has not wanted anything for treatment until today.  States she is completely sick of the irregular bleeding which can be heavy and times.  She is absolutely interested in discussion options.    Patient's last menstrual period was 08/26/2017.  Contraception: BLT  Findings:  UTERUS:9.3 x 7.8 x 5.1cm with 3.4 x 3.8 x 4.0cm fibroid located left and subserosal EMS:6.693mm ADNEXA: Left ovary: 3.5 x 1.5 x 1.3cm       Right ovary: 2.7 x 2.3 x 1.6cm with 9mm follicle CUL DE SAC: no free fluid  Discussion:  Findings reviewed.  Pt is not interested in any hormonal treatment.  POPs, Mirena IUD, OCPs discussed.  Combination OCPs not recommended due to hypertension.  Endometrial ablation, myomectomy and hysterectomy all discussed as well.  She is interested in the procedure with the fastest recovery as well.  As I do think she is not far from menopause, endometrial ablation will likely achieve pt's desired outcome.  Procedure discussed with patient.  Recovery and pain management discussed.  Risks discussed including but not limited to bleeding, rare risk of transfusion, infection, 1% risk of uterine perforation with risks of fluid deficit causing cardiac arrythmia, cerebral swelling and/or need to stop procedure early.  Fluid emboli and rare risk of death discussed.  DVT/PE, rare risk of risk of bowel/bladder/ureteral/vascular injury.  Patient aware if pathology abnormal she may need additional treatment.  She is aware she should never be pregnant after an endometrial ablation.  She's already had a BTL performed.  All questions answered.    Pt does need endometrial sampling which she would prefer to not do today.  Advised can do in OR but aware if any abnormal pathology is present, then additional  treatment recommendations would be made.  She voices understanding.  Assessment:  Perimenopausal menorrhagia  Plan:  Pt is desirous of proceeding with hysteroscopy and endometrial ablation.  Will proceed with surgical planning.  ~25 minutes spent with patient >50% of time was in face to face discussion of above.

## 2017-09-01 ENCOUNTER — Telehealth: Payer: Self-pay | Admitting: Obstetrics & Gynecology

## 2017-09-01 NOTE — Telephone Encounter (Signed)
Spoke with patient regarding benefit for recommended surgical procedure. Patient understood and agreeable. Patient aware this is professional benefit only. Patient aware will be contacted by hospital for separate benefits, once surgery date has been established.   Patient states she is ready to move forward with procedure, but after speaking with her husband, he has some questions. Patient states she and her husband are not on the "same page" with procedure.  I advised patient I will have our nurse to call her to address any questions or concerns her spouse has. Patient is agreeable.  Routing to Billie RuddySally Yeakley, RN

## 2017-09-01 NOTE — Telephone Encounter (Signed)
See previous phone note. Patient called and advises she has had a long conversation with her spouse.  Patient has confirmed and is ready to proceed with scheduling. Routing to Building control surveyorurse Supervisor for scheduling.  Routing to Billie RuddySally Yeakley, RN

## 2017-09-01 NOTE — Telephone Encounter (Signed)
Reviewed surgery date options with patient. Agreeable to 09-15-17. Surgery instruction sheet reviewed and printed copy will be mailed with surgery center brochure.  Patient with BTSP.   Routing to provider for final review. Patient agreeable to disposition. Will close encounter.

## 2017-09-03 ENCOUNTER — Telehealth: Payer: Self-pay | Admitting: Obstetrics & Gynecology

## 2017-09-03 NOTE — Telephone Encounter (Signed)
Patient is scheduled for surgery on Nov 6 and need to know how long she will be out of work.

## 2017-09-03 NOTE — Telephone Encounter (Signed)
Routing to Sally Yeakley, RN. 

## 2017-09-03 NOTE — Telephone Encounter (Signed)
Return call to patient. Discussed procedure day and day following are recommended to be out of work.  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 Discussed that 2 days after surgery is reasonable. Can provide note if needed.   Routing to provider for final review. Patient agreeable to disposition. Will close encounter.

## 2017-09-04 ENCOUNTER — Telehealth: Payer: Self-pay | Admitting: Obstetrics & Gynecology

## 2017-09-04 NOTE — Telephone Encounter (Signed)
Patient returning call to Sally. °

## 2017-09-04 NOTE — Telephone Encounter (Signed)
Confirmed surgery date and time of [facility]-15-17 at [facility]. Anticipate arrival at [id number].  Routing to provider for final review. Patient agreeable to disposition. Will close encounter.

## 2017-09-08 ENCOUNTER — Other Ambulatory Visit: Payer: Self-pay | Admitting: Obstetrics & Gynecology

## 2017-09-10 ENCOUNTER — Encounter (HOSPITAL_BASED_OUTPATIENT_CLINIC_OR_DEPARTMENT_OTHER): Payer: Self-pay | Admitting: *Deleted

## 2017-09-10 NOTE — Progress Notes (Signed)
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

## 2017-09-11 ENCOUNTER — Encounter (HOSPITAL_COMMUNITY)
Admission: RE | Admit: 2017-09-11 | Discharge: 2017-09-11 | Disposition: A | Discharge status: Home / Self Care | Payer: BC Managed Care – PPO | Source: Ambulatory Visit | Attending: Obstetrics & Gynecology | Admitting: Obstetrics & Gynecology

## 2017-09-11 ENCOUNTER — Other Ambulatory Visit: Payer: Self-pay | Admitting: Obstetrics & Gynecology

## 2017-09-11 ENCOUNTER — Encounter (INDEPENDENT_AMBULATORY_CARE_PROVIDER_SITE_OTHER): Payer: Self-pay

## 2017-09-11 DIAGNOSIS — N920 Excessive and frequent menstruation with regular cycle: Secondary | ICD-10-CM | POA: Diagnosis present

## 2017-09-11 DIAGNOSIS — I10 Essential (primary) hypertension: Secondary | ICD-10-CM | POA: Diagnosis not present

## 2017-09-11 DIAGNOSIS — N939 Abnormal uterine and vaginal bleeding, unspecified: Secondary | ICD-10-CM | POA: Diagnosis not present

## 2017-09-11 DIAGNOSIS — M1712 Unilateral primary osteoarthritis, left knee: Secondary | ICD-10-CM | POA: Diagnosis not present

## 2017-09-11 DIAGNOSIS — I517 Cardiomegaly: Secondary | ICD-10-CM | POA: Diagnosis not present

## 2017-09-11 DIAGNOSIS — G43109 Migraine with aura, not intractable, without status migrainosus: Secondary | ICD-10-CM | POA: Diagnosis not present

## 2017-09-11 DIAGNOSIS — G4700 Insomnia, unspecified: Secondary | ICD-10-CM | POA: Diagnosis not present

## 2017-09-11 DIAGNOSIS — F419 Anxiety disorder, unspecified: Secondary | ICD-10-CM | POA: Diagnosis not present

## 2017-09-11 LAB — BASIC METABOLIC PANEL
Anion gap: 7 (ref 5–15)
BUN: 15 mg/dL (ref 6–20)
CO2: 31 mmol/L (ref 22–32)
Calcium: 9.7 mg/dL (ref 8.9–10.3)
Chloride: 103 mmol/L (ref 101–111)
Creatinine, Ser: 0.83 mg/dL (ref 0.44–1.00)
GFR calc Af Amer: >60 mL/min (ref >60)
GFR calc non Af Amer: >60 mL/min (ref >60)
Glucose, Bld: 88 mg/dL (ref 65–99)
Potassium: 4.2 mmol/L (ref 3.5–5.1)
Sodium: 141 mmol/L (ref 135–145)

## 2017-09-11 LAB — CBC
HCT: 36.1 % (ref 36.0–46.0)
Hemoglobin: 12.2 g/dL (ref 12.0–15.0)
MCH: 30.9 pg (ref 26.0–34.0)
MCHC: 33.8 g/dL (ref 30.0–36.0)
MCV: 91.4 fL (ref 78.0–100.0)
Platelets: 197 10*3/uL (ref 150–400)
RBC: 3.95 MIL/uL (ref 3.87–5.11)
RDW: 13.8 % (ref 11.5–15.5)
WBC: 3.5 10*3/uL — ABNORMAL LOW (ref 4.0–10.5)

## 2017-09-11 NOTE — Progress Notes (Signed)
PT NEEDS EKG ON DOS ON ARRIVAL.

## 2017-09-14 ENCOUNTER — Ambulatory Visit: Payer: BC Managed Care – PPO | Admitting: Obstetrics & Gynecology

## 2017-09-15 ENCOUNTER — Encounter (HOSPITAL_BASED_OUTPATIENT_CLINIC_OR_DEPARTMENT_OTHER): Payer: Self-pay | Admitting: *Deleted

## 2017-09-15 ENCOUNTER — Ambulatory Visit (HOSPITAL_BASED_OUTPATIENT_CLINIC_OR_DEPARTMENT_OTHER): Payer: BC Managed Care – PPO | Admitting: Anesthesiology

## 2017-09-15 ENCOUNTER — Ambulatory Visit (HOSPITAL_BASED_OUTPATIENT_CLINIC_OR_DEPARTMENT_OTHER)
Admission: RE | Admit: 2017-09-15 | Discharge: 2017-09-15 | Disposition: A | Discharge status: Home / Self Care | Payer: BC Managed Care – PPO | Source: Ambulatory Visit | Attending: Obstetrics & Gynecology | Admitting: Obstetrics & Gynecology

## 2017-09-15 ENCOUNTER — Encounter (HOSPITAL_BASED_OUTPATIENT_CLINIC_OR_DEPARTMENT_OTHER)
Admission: RE | Disposition: A | Discharge status: Home / Self Care | Payer: Self-pay | Source: Ambulatory Visit | Attending: Obstetrics & Gynecology

## 2017-09-15 DIAGNOSIS — I10 Essential (primary) hypertension: Secondary | ICD-10-CM | POA: Diagnosis not present

## 2017-09-15 DIAGNOSIS — N939 Abnormal uterine and vaginal bleeding, unspecified: Secondary | ICD-10-CM | POA: Diagnosis not present

## 2017-09-15 DIAGNOSIS — N921 Excessive and frequent menstruation with irregular cycle: Secondary | ICD-10-CM | POA: Diagnosis not present

## 2017-09-15 DIAGNOSIS — N920 Excessive and frequent menstruation with regular cycle: Secondary | ICD-10-CM | POA: Diagnosis not present

## 2017-09-15 DIAGNOSIS — G4700 Insomnia, unspecified: Secondary | ICD-10-CM | POA: Insufficient documentation

## 2017-09-15 DIAGNOSIS — F419 Anxiety disorder, unspecified: Secondary | ICD-10-CM | POA: Insufficient documentation

## 2017-09-15 DIAGNOSIS — M1712 Unilateral primary osteoarthritis, left knee: Secondary | ICD-10-CM | POA: Insufficient documentation

## 2017-09-15 DIAGNOSIS — I517 Cardiomegaly: Secondary | ICD-10-CM | POA: Insufficient documentation

## 2017-09-15 DIAGNOSIS — G43109 Migraine with aura, not intractable, without status migrainosus: Secondary | ICD-10-CM | POA: Insufficient documentation

## 2017-09-15 HISTORY — PX: DILATATION & CURETTAGE/HYSTEROSCOPY WITH MYOSURE: SHX6511

## 2017-09-15 HISTORY — DX: Unspecified osteoarthritis, unspecified site: M19.90

## 2017-09-15 LAB — POCT PREGNANCY, URINE: Preg Test, Ur: NEGATIVE

## 2017-09-15 SURGERY — DILATATION & CURETTAGE/HYSTEROSCOPY WITH MYOSURE
Anesthesia: General | Site: Uterus

## 2017-09-15 MED ORDER — SILVER NITRATE-POT NITRATE 75-25 % EX MISC
CUTANEOUS | Status: DC | PRN
  Administered 2017-09-15: 1

## 2017-09-15 MED ORDER — IBUPROFEN 800 MG PO TABS: 800.0000 mg | ORAL_TABLET | Freq: Three times a day (TID) | ORAL | 0 refills | Status: DC | PRN

## 2017-09-15 MED ORDER — OXYCODONE HCL 5 MG/5ML PO SOLN
5.0000 mg | Freq: Once | ORAL | Status: DC | PRN
  Filled 2017-09-15: qty 5

## 2017-09-15 MED ORDER — PROPOFOL 10 MG/ML IV BOLUS
INTRAVENOUS | Status: AC
  Filled 2017-09-15: qty 20

## 2017-09-15 MED ORDER — LACTATED RINGERS IV SOLN
INTRAVENOUS | Status: DC
  Administered 2017-09-15 (×2): via INTRAVENOUS
  Filled 2017-09-15: qty 1000

## 2017-09-15 MED ORDER — ONDANSETRON HCL 4 MG PO TABS: 4.0000 mg | ORAL_TABLET | Freq: Three times a day (TID) | ORAL | 0 refills | Status: DC | PRN

## 2017-09-15 MED ORDER — KETOROLAC TROMETHAMINE 30 MG/ML IJ SOLN
INTRAMUSCULAR | Status: AC
  Filled 2017-09-15: qty 1

## 2017-09-15 MED ORDER — HYDROCODONE-ACETAMINOPHEN 5-325 MG PO TABS: 1.0000 | ORAL_TABLET | Freq: Four times a day (QID) | ORAL | 0 refills | Status: DC | PRN

## 2017-09-15 MED ORDER — DEXAMETHASONE SODIUM PHOSPHATE 4 MG/ML IJ SOLN
INTRAMUSCULAR | Status: DC | PRN
  Administered 2017-09-15: 10 mg via INTRAVENOUS

## 2017-09-15 MED ORDER — DIAZEPAM 5 MG PO TABS: 5.0000 mg | ORAL_TABLET | Freq: Three times a day (TID) | ORAL | 0 refills | Status: DC | PRN

## 2017-09-15 MED ORDER — PROMETHAZINE HCL 25 MG/ML IJ SOLN
6.2500 mg | INTRAMUSCULAR | Status: DC | PRN
  Filled 2017-09-15: qty 1

## 2017-09-15 MED ORDER — DEXAMETHASONE SODIUM PHOSPHATE 10 MG/ML IJ SOLN
INTRAMUSCULAR | Status: AC
  Filled 2017-09-15: qty 1

## 2017-09-15 MED ORDER — FENTANYL CITRATE (PF) 100 MCG/2ML IJ SOLN
INTRAMUSCULAR | Status: DC | PRN
  Administered 2017-09-15: 50 ug via INTRAVENOUS
  Administered 2017-09-15: 25 ug via INTRAVENOUS
  Administered 2017-09-15: 50 ug via INTRAVENOUS
  Administered 2017-09-15 (×3): 25 ug via INTRAVENOUS

## 2017-09-15 MED ORDER — PROPOFOL 10 MG/ML IV BOLUS
INTRAVENOUS | Status: DC | PRN
  Administered 2017-09-15: 200 mg via INTRAVENOUS

## 2017-09-15 MED ORDER — SODIUM CHLORIDE 0.9 % IR SOLN
Status: DC | PRN
  Administered 2017-09-15: 3000 mL

## 2017-09-15 MED ORDER — LIDOCAINE 2% (20 MG/ML) 5 ML SYRINGE
INTRAMUSCULAR | Status: DC | PRN
  Administered 2017-09-15: 100 mg via INTRAVENOUS

## 2017-09-15 MED ORDER — KETOROLAC TROMETHAMINE 60 MG/2ML IM SOLN
INTRAMUSCULAR | Status: DC | PRN
  Administered 2017-09-15: 30 mg via INTRAMUSCULAR

## 2017-09-15 MED ORDER — LIDOCAINE-EPINEPHRINE 1 %-1:100000 IJ SOLN
INTRAMUSCULAR | Status: DC | PRN
  Administered 2017-09-15: 10 mL

## 2017-09-15 MED ORDER — ONDANSETRON HCL 4 MG/2ML IJ SOLN
INTRAMUSCULAR | Status: DC | PRN
Start: 2017-09-15 — End: 2017-09-15
  Administered 2017-09-15: 4 mg via INTRAVENOUS

## 2017-09-15 MED ORDER — OXYCODONE HCL 5 MG PO TABS
5.0000 mg | ORAL_TABLET | Freq: Once | ORAL | Status: DC | PRN
  Filled 2017-09-15: qty 1

## 2017-09-15 MED ORDER — LACTATED RINGERS IV SOLN
INTRAVENOUS | Status: DC
  Filled 2017-09-15: qty 1000

## 2017-09-15 MED ORDER — LIDOCAINE 2% (20 MG/ML) 5 ML SYRINGE
INTRAMUSCULAR | Status: AC
  Filled 2017-09-15: qty 5

## 2017-09-15 MED ORDER — ONDANSETRON HCL 4 MG/2ML IJ SOLN
INTRAMUSCULAR | Status: AC
  Filled 2017-09-15: qty 2

## 2017-09-15 MED ORDER — FENTANYL CITRATE (PF) 100 MCG/2ML IJ SOLN
INTRAMUSCULAR | Status: AC
  Filled 2017-09-15: qty 2

## 2017-09-15 MED ORDER — MENTHOL 3 MG MT LOZG
LOZENGE | OROMUCOSAL | Status: AC
  Filled 2017-09-15: qty 9

## 2017-09-15 MED ORDER — MIDAZOLAM HCL 5 MG/5ML IJ SOLN
INTRAMUSCULAR | Status: DC | PRN
Start: 2017-09-15 — End: 2017-09-15
  Administered 2017-09-15: 2 mg via INTRAVENOUS

## 2017-09-15 MED ORDER — FENTANYL CITRATE (PF) 100 MCG/2ML IJ SOLN
25.0000 ug | INTRAMUSCULAR | Status: DC | PRN
  Filled 2017-09-15: qty 1

## 2017-09-15 MED ORDER — KETOROLAC TROMETHAMINE 30 MG/ML IJ SOLN
INTRAMUSCULAR | Status: DC | PRN
  Administered 2017-09-15: 30 mg via INTRAVENOUS

## 2017-09-15 MED ORDER — MIDAZOLAM HCL 2 MG/2ML IJ SOLN
INTRAMUSCULAR | Status: AC
  Filled 2017-09-15: qty 2

## 2017-09-15 SURGICAL SUPPLY — 26 items
ABLATOR ENDOMETRIAL BIPOLAR (ABLATOR) ×2 IMPLANT
BIPOLAR CUTTING LOOP 21FR (ELECTRODE)
CANISTER SUCT 3000ML PPV (MISCELLANEOUS) ×4 IMPLANT
CATH ROBINSON RED A/P 16FR (CATHETERS) ×2 IMPLANT
CLOTH BEACON ORANGE TIMEOUT ST (SAFETY) ×2 IMPLANT
DEVICE MYOSURE LITE (MISCELLANEOUS) IMPLANT
DEVICE MYOSURE REACH (MISCELLANEOUS) IMPLANT
DILATOR CANAL MILEX (MISCELLANEOUS) IMPLANT
ELECT COAG BIPOL BALL 21FR (ELECTRODE) ×2 IMPLANT
FILTER ARTHROSCOPY CONVERTOR (FILTER) ×2 IMPLANT
GLOVE ECLIPSE 6.5 STRL STRAW (GLOVE) ×4 IMPLANT
GLOVE INDICATOR 7.0 STRL GRN (GLOVE) ×2 IMPLANT
GOWN STRL REUS W/ TWL LRG LVL3 (GOWN DISPOSABLE) ×1 IMPLANT
GOWN STRL REUS W/ TWL XL LVL3 (GOWN DISPOSABLE) ×1 IMPLANT
GOWN STRL REUS W/TWL LRG LVL3 (GOWN DISPOSABLE) ×1
GOWN STRL REUS W/TWL XL LVL3 (GOWN DISPOSABLE) ×1
IV NS IRRIG 3000ML ARTHROMATIC (IV SOLUTION) ×4 IMPLANT
KIT RM TURNOVER CYSTO AR (KITS) ×2 IMPLANT
LOOP CUTTING BIPOLAR 21FR (ELECTRODE) IMPLANT
PACK VAGINAL MINOR WOMEN LF (CUSTOM PROCEDURE TRAY) ×2 IMPLANT
PAD OB MATERNITY 4.3X12.25 (PERSONAL CARE ITEMS) ×2 IMPLANT
SEAL ROD LENS SCOPE MYOSURE (ABLATOR) ×2 IMPLANT
TOWEL OR 17X24 6PK STRL BLUE (TOWEL DISPOSABLE) ×4 IMPLANT
TUBING AQUILEX INFLOW (TUBING) ×2 IMPLANT
TUBING AQUILEX OUTFLOW (TUBING) ×2 IMPLANT
WATER STERILE IRR 500ML POUR (IV SOLUTION) ×2 IMPLANT

## 2017-09-15 NOTE — H&P (Signed)
Michaela Hanson is an 51 y.o. female  G2P2 MAAF here for hysteroscopy with D&C and Novasure ablation due to irregular and heavy peri-menopasual bleeding.  She has been evaluated with ultrasound that does show a 4.0cm fibroid.  She is not interested in any hormonal therapy and does not want to have a more aggressive surgical procedure.  As she has fluctuating FSH values this year and is close to menopause, I feel even with the 4cm fibroid, the is an appropriate procedure at this point.  Alternatives, risks and benefits have been discussed.  She is here and ready to proceed.  Pertinent Gynecological History: Menses: irregular and heavy Contraception: tubal ligation DES exposure: denies Blood transfusions: none Sexually transmitted diseases: no past history Previous GYN Procedures: LEEP  Last mammogram: normal Date: 7/16 Last pap: normal Date: 10/17 OB History: G2, P2   Menstrual History: Patient's last menstrual period was 09/13/2017.    Past Medical History:  Diagnosis Date  . Abnormal Pap smear 8/13   8/13 ASCUS +HPV, 8/14 ASCUS HPV not detected, 06-26-14 ASCUS +HPV HR  . Anemia    WITH PREGNANCY  . Anxiety   . Arthritis    LEFT KNEE  . Hypertension   . Insomnia   . Migraine headache    with aura (visual changes)    Past Surgical History:  Procedure Laterality Date  . APPENDECTOMY  2005  . ARTHROSCOPY WITH ANTERIOR CRUCIATE LIGAMENT (ACL) REPAIR WITH ANTERIOR TIBILIAS GRAFT  03/07/2016    Dr. Thamas JaegersLennon  . BREAST CYST EXCISION Left 6/13  . 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 TUBAL LIGATION  1998   BTL    Family History  Problem Relation Age of Onset  . Hypertension Mother   . Hypertension Father   . Breast cancer Sister [id number]  . Breast cancer Paternal Aunt [phone]       died of pancreatic cancer age 49    Social History:  reports that  has never smoked. she has never used smokeless tobacco. She reports that she does not drink alcohol or use drugs.  Allergies:  Allergies   Allergen Reactions  . Flagyl [Metronidazole]     nausea    Medications Prior to Admission  Medication Sig Dispense Refill Last Dose  . clobetasol ointment (TEMOVATE) 0.05 % Apply topically 2 (two) times daily. Do not use for more than 7 days in a row. 30 g 1 Past Week at Unknown time  . escitalopram (LEXAPRO) 10 MG tablet Take 15 mg by mouth.   09/15/2017 at 0600  . estradiol (ESTRACE) 0.1 MG/GM vaginal cream 1 gram vaginally twice weekly 42.5 g 1 Past Month at Unknown time  . Multiple Vitamins-Minerals (MULTIVITAMIN PO) Take by mouth daily.   Past Month at Unknown time  . triamterene-hydrochlorothiazide (DYAZIDE) 37.5-25 MG capsule Take by mouth daily.   09/14/2017 at Unknown time  . Butalbital-Aspirin-Caffeine (BUTALBITAL-ASA-CAFFEINE PO) Take by mouth as needed.   More than a month at Unknown time  . gabapentin (NEURONTIN) 100 MG capsule 1 tab nightly for 7 days.  Increase to 2 tabs nightly for 7 days.  Increase if needed to 3 tabs nightly for 7 days. 63 capsule 0 Taking  . meloxicam (MOBIC) 15 MG tablet Take 15 mg by mouth daily.   More than a month at Unknown time  . nystatin cream (MYCOSTATIN) Apply 1 application topically 2 (two) times daily. Apply to affected area BID for up to 7 days. 30 g 1 Taking  Review of Systems  All other systems reviewed and are negative.   Blood pressure 111/67, pulse 74, temperature 97.6 F (36.4 C), temperature source Oral, height 5\' 8"  (1.727 m), weight 219 lb (99.3 kg), last menstrual period 09/13/2017, SpO2 99 %. Physical Exam  Constitutional: She is oriented to person, place, and time. She appears well-developed and well-nourished.  Cardiovascular: Normal rate and regular rhythm.  Respiratory: Effort normal and breath sounds normal.  Neurological: She is alert and oriented to person, place, and time.  Skin: Skin is warm and dry.  Psychiatric: She has a normal mood and affect.    Results for orders placed or performed during the hospital  encounter of 09/15/17 (from the past 24 hour(s))  Pregnancy, urine POC     Status: None   Collection Time: 09/15/17  7:51 AM  Result Value Ref Range   Preg Test, Ur NEGATIVE NEGATIVE    No results found.  Assessment/Plan: 51 yo G2P2 MAAF with irregular bleeding and menorrhagia with 4.0cm fibroid here for hysteroscopy, D&C and Novasure endometrial ablation.    Michaela Hanson, Michaela Hanson SUZANNE 09/15/2017, 8:14 AM

## 2017-09-15 NOTE — Anesthesia Preprocedure Evaluation (Signed)
Anesthesia Evaluation  Patient identified by MRN, date of birth, ID band Patient awake    Reviewed: Allergy & Precautions, NPO status , Patient's Chart, lab work & pertinent test results  Airway Mallampati: II  TM Distance: >3 FB Neck ROM: Full    Dental no notable dental hx.    Pulmonary neg pulmonary ROS,    Pulmonary exam normal breath sounds clear to auscultation       Cardiovascular hypertension, Normal cardiovascular exam Rhythm:Regular Rate:Normal     Neuro/Psych negative neurological ROS  negative psych ROS   GI/Hepatic negative GI ROS, Neg liver ROS,   Endo/Other  negative endocrine ROS  Renal/GU negative Renal ROS  negative genitourinary   Musculoskeletal negative musculoskeletal ROS (+)   Abdominal   Peds negative pediatric ROS (+)  Hematology negative hematology ROS (+)   Anesthesia Other Findings   Reproductive/Obstetrics negative OB ROS                             Anesthesia Physical Anesthesia Plan  ASA: II  Anesthesia Plan: General   Post-op Pain Management:    Induction: Intravenous  PONV Risk Score and Plan: 3 and Ondansetron, Dexamethasone and Treatment may vary due to age or medical condition  Airway Management Planned: LMA  Additional Equipment:   Intra-op Plan:   Post-operative Plan:   Informed Consent: I have reviewed the patients History and Physical, chart, labs and discussed the procedure including the risks, benefits and alternatives for the proposed anesthesia with the patient or authorized representative who has indicated his/her understanding and acceptance.   Dental advisory given  Plan Discussed with: CRNA and Surgeon  Anesthesia Plan Comments:         Anesthesia Quick Evaluation

## 2017-09-15 NOTE — Transfer of Care (Signed)
Immediate Anesthesia Transfer of Care Note  Patient: Michaela Hanson  Procedure(s) Performed: Procedure(s) (LRB): DILATATION & CURETTAGE/HYSTEROSCOPY,WITH ROLLERBALL ABLATION (N/A)  Patient Location: PACU  Anesthesia Type: General  Level of Consciousness: awake, sedated, patient cooperative and responds to stimulation  Airway & Oxygen Therapy: Patient Spontanous Breathing and Patient connected to [place] oxygen  Post-op Assessment: Report given to PACU RN, Post -op Vital signs reviewed and stable and Patient moving all extremities  Post vital signs: Reviewed and stable  Complications: No apparent anesthesia complications

## 2017-09-15 NOTE — Anesthesia Postprocedure Evaluation (Signed)
Anesthesia Post Note  Patient: Michaela Hanson  Procedure(s) Performed: DILATATION & CURETTAGE/HYSTEROSCOPY,WITH ROLLERBALL ABLATION (N/A Uterus)     Anesthesia Post Evaluation  Last Vitals:  Vitals:   09/15/17 0958 09/15/17 1000  BP:  (!) 146/81  Pulse:  99  Resp: 15 16  Temp: 36.4 C   SpO2:  100%    Last Pain:  Vitals:   09/15/17 0637  TempSrc: Oral                 Yanette Tripoli S

## 2017-09-15 NOTE — Discharge Instructions (Addendum)
Post Anesthesia Home Care Instructions  Activity: Get plenty of rest for the remainder of the day. A responsible individual must stay with you for 24 hours following the procedure.  For the next 24 hours, DO NOT: -Drive a car -Advertising copywriterperate machinery -Drink alcoholic beverages -Take any medication unless instructed by your physician -Make any legal decisions or sign important papers.  Meals: Start with liquid foods such as gelatin or soup. Progress to regular foods as tolerated. Avoid greasy, spicy, heavy foods. If nausea and/or vomiting occur, drink only clear liquids until the nausea and/or vomiting subsides. Call your physician if vomiting continues.  Special Instructions/Symptoms: Your throat may feel dry or sore from the anesthesia or the breathing tube placed in your throat during surgery. If this causes discomfort, gargle with warm salt water. The discomfort should disappear within 24 hours.  If you had a scopolamine patch placed behind your ear for the management of post- operative nausea and/or vomiting:  1. The medication in the patch is effective for 72 hours, after which it should be removed.  Wrap patch in a tissue and discard in the trash. Wash hands thoroughly with soap and water. 2. You may remove the patch earlier than 72 hours if you experience unpleasant side effects which may include dry mouth, dizziness or visual disturbances. 3. Avoid touching the patch. Wash your hands with soap and water after contact with the patch.   D & C Home care Instructions:   Personal hygiene:  Used sanitary napkins for vaginal drainage not tampons. Shower or tub bathe the day after your procedure. No douching until bleeding stops. Always wipe from front to back after  Elimination.  Activity: Do not drive or operate any equipment today. The effects of the anesthesia are still present and drowsiness may result. Rest today, not necessarily flat bed rest, just take it easy. You may resume your  normal activity in one to 2 days.  Sexual activity: No intercourse for one week or as indicated by your physician  Diet: Eat a light diet as desired this evening. You may resume a regular diet tomorrow.  Return to work: One to 2 days.  General Expectations of your surgery: Vaginal bleeding should be no heavier than a normal period. Spotting may continue up to 10 days. Mild cramps may continue for a couple of days. You may have a regular period in 2-6 weeks.  Unexpected observations call your doctor if these occur: persistent or heavy bleeding. Severe abdominal cramping or pain. Elevation of temperature greater than 100F.   Post-surgical Instructions, Outpatient Surgery  You may expect to feel dizzy, weak, and drowsy for as long as 24 hours after receiving the medicine that made you sleep (anesthetic). For the first 24 hours after your surgery:    Do not drive a car, ride a bicycle, participate in physical activities, or take public transportation until you are done taking narcotic pain medicines or as directed by Dr. Hyacinth MeekerMiller.   Do not drink alcohol or take tranquilizers.   Do not take medicine that has not been prescribed by your physicians.   Do not sign important papers or make important decisions while on narcotic pain medicines.   Have a responsible person with you.   PAIN MANAGEMENT  Motrin 800mg .  (This is the same as 4-200mg  over the counter tablets of Motrin or ibuprofen.)  You may take this every eight hours or as needed for cramping.   Vicodin 5/325mg .  For more severe pain,  take one or two tablets every four to six hours as needed for pain control.  (Remember that narcotic pain medications increase your risk of constipation.  If this becomes a problem, you may take an over the counter stool softener like Colace 100mg  up to four times a day.)  Valium 5mg  every 6-8 hours as needed for cramping.  DO'S AND DON'T'S  Do not take a tub bath for one week.  You may shower on  the first day after your surgery  Do not do any heavy lifting for one to two weeks.  This increases the chance of bleeding.  Do move around as you feel able.  Stairs are fine.  You may begin to exercise again as you feel able.  Do not lift any weights for two weeks.  Do not put anything in the vagina for two weeks--no tampons, intercourse, or douching.    REGULAR MEDIATIONS/VITAMINS:  You may restart all of your regular medications as prescribed.  You may restart all of your vitamins as you normally take them.    PLEASE CALL OR SEEK MEDICAL CARE IF:  You have persistent nausea and vomiting.   You have trouble eating or drinking.   You have an oral temperature above 100.5.   You have constipation that is not helped by adjusting diet or increasing fluid intake. Pain medicines are a common cause of constipation.   You have heavy vaginal bleeding

## 2017-09-15 NOTE — Anesthesia Procedure Notes (Signed)
Procedure Name: LMA Insertion Date/Time: 09/15/2017 8:41 AM Performed by: Jessica PriestBeeson, Merlinda Wrubel C, CRNA Pre-anesthesia Checklist: Patient identified, Emergency Drugs available, Suction available and Patient being monitored Patient Re-evaluated:Patient Re-evaluated prior to induction Oxygen Delivery Method: Circle system utilized Preoxygenation: Pre-oxygenation with 100% oxygen Induction Type: IV induction Ventilation: Mask ventilation without difficulty LMA: LMA inserted LMA Size: 4.0 Number of attempts: 1 Airway Equipment and Method: Bite block Placement Confirmation: positive ETCO2 and breath sounds checked- equal and bilateral Tube secured with: Tape Dental Injury: Teeth and Oropharynx as per pre-operative assessment

## 2017-09-15 NOTE — Op Note (Signed)
09/15/2017  9:56 AM  PATIENT:  Michaela StanfordPatricia Hanson  51 y.o. female  PRE-OPERATIVE DIAGNOSIS:  menorrhagia, AUB  POST-OPERATIVE DIAGNOSIS:  menorrhagia, AUB  PROCEDURE:  Procedure(s): DILATATION & CURETTAGE/HYSTEROSCOPY,WITH ROLLERBALL ABLATION, VULVAR BIOPSY  SURGEON:  Dannia Snook SUZANNE  ASSISTANTS: OR staff   ANESTHESIA:   general  ESTIMATED BLOOD LOSS: 20 mL  BLOOD ADMINISTERED:none   FLUIDS: 1200cc LR  UOP: 60cc clear uOP  SPECIMEN:  Endometrial curettings, vulvar bipsy  DISPOSITION OF SPECIMEN:  PATHOLOGY  FINDINGS: flat pigmented vulvar lesion that wasn't present on physical exam a few weeks ago.  Narrow endometrial cavity.  DESCRIPTION OF OPERATION: Patient was taken to the operating room.  She is placed in the supine position. SCDs were on her lower extremities and functioning properly. General anesthesia with an LMA was administered without difficulty. Dr. Okey Dupreose, anesthesia, oversaw case.  Legs were then placed in the [facility] stirrups in the low lithotomy position. The legs were lifted to the high lithotomy position and the Betadine prep was used on the inner thighs perineum and vagina x3. Patient was draped in a normal standard fashion. An in and out catheterization with a red rubber Foley catheter was performed. Approximately 60 cc of clear urine was noted. A bivalve speculum was placed the vagina. The anterior lip of the cervix was grasped with single-tooth tenaculum.  A paracervical block of 1% lidocaine mixed one-to-one with epinephrine (1:100,000 units).  10 cc was used total. The cervix is dilated up to [facility]. The endometrial cavity sounded to 8 cm.  Cervical length was 4 cm.  A myosure hysteroscope was obtained.  Normal saline was used as a hysteroscopic fluid. The hysteroscope was advanced through the endocervical canal into the endometrial cavity. The tubal ostia were noted bilaterally.  Additional findings included narrow cavity.  At this point, I  questioned whether I was going to be able to perform the Novasure ablation due to narrow cavity width.  The hysteroscope was removed. A #1 toothed curette was used to curette the cavity until rough gritty texture was noted in all quadrants. The cavity was revisualized.  At this point, the Novasure device was obtained.  This was passed to the fundus and the array opened.  The cavity width was between 2.0 and 2.2 and this was not wide enough to proceed with Novasure ablation.  Then the resectoscope with roller ball was obtained.  With setting of 3 and using the cut setting the endometrial cavity was ablated with bipolar roller ball.  Endometrial cavity was blanched at this point.  Fluid deficit was 650cc NS.  At this point, the procedure was ended.  The hysteroscope was removed. The tenaculum was removed from the anterior lip of the cervix. The speculum was removed from the vagina. The vulvar lesion noted at the beginning of the procedure was then biopsied.  Skin was anesthetized with 1.0cc 1% Lidocaine with epinephrine (1:100,000 Units).  Biopsy was obtained with pick-ups and #15 blade.  Silver nitrate was used to obtained excellent hemostasis.  Pt tolerated the procedure well.  The prep was cleansed of the patient's skin. The legs are positioned back in the supine position. Sponge, lap, needle, initially counts were correct x2. Patient was taken to recovery in stable condition.  COUNTS:  YES  PLAN OF CARE: Transfer to PACU

## 2017-09-16 ENCOUNTER — Encounter (HOSPITAL_BASED_OUTPATIENT_CLINIC_OR_DEPARTMENT_OTHER): Payer: Self-pay | Admitting: Obstetrics & Gynecology

## 2017-10-16 ENCOUNTER — Other Ambulatory Visit (HOSPITAL_COMMUNITY)
Admission: RE | Admit: 2017-10-16 | Discharge: 2017-10-16 | Disposition: A | Discharge status: Home / Self Care | Payer: BC Managed Care – PPO | Source: Ambulatory Visit | Attending: Obstetrics & Gynecology | Admitting: Obstetrics & Gynecology

## 2017-10-16 ENCOUNTER — Other Ambulatory Visit: Payer: Self-pay

## 2017-10-16 ENCOUNTER — Encounter: Payer: Self-pay | Admitting: Obstetrics & Gynecology

## 2017-10-16 ENCOUNTER — Ambulatory Visit (INDEPENDENT_AMBULATORY_CARE_PROVIDER_SITE_OTHER): Payer: BC Managed Care – PPO | Admitting: Obstetrics & Gynecology

## 2017-10-16 VITALS — BP 114/68 | HR 72 | Resp 16 | Ht 67.5 in | Wt 216.0 lb

## 2017-10-16 DIAGNOSIS — Z124 Encounter for screening for malignant neoplasm of cervix: Secondary | ICD-10-CM

## 2017-10-16 DIAGNOSIS — Z01419 Encounter for gynecological examination (general) (routine) without abnormal findings: Secondary | ICD-10-CM

## 2017-10-16 DIAGNOSIS — B977 Papillomavirus as the cause of diseases classified elsewhere: Secondary | ICD-10-CM

## 2017-10-16 DIAGNOSIS — Z8742 Personal history of other diseases of the female genital tract: Secondary | ICD-10-CM | POA: Insufficient documentation

## 2017-10-16 DIAGNOSIS — Z803 Family history of malignant neoplasm of breast: Secondary | ICD-10-CM | POA: Diagnosis not present

## 2017-10-16 DIAGNOSIS — Z791 Long term (current) use of non-steroidal anti-inflammatories (NSAID): Secondary | ICD-10-CM | POA: Insufficient documentation

## 2017-10-16 DIAGNOSIS — Z1151 Encounter for screening for human papillomavirus (HPV): Secondary | ICD-10-CM | POA: Diagnosis not present

## 2017-10-16 DIAGNOSIS — Z8249 Family history of ischemic heart disease and other diseases of the circulatory system: Secondary | ICD-10-CM | POA: Diagnosis not present

## 2017-10-16 DIAGNOSIS — Z79899 Other long term (current) drug therapy: Secondary | ICD-10-CM | POA: Insufficient documentation

## 2017-10-16 DIAGNOSIS — I10 Essential (primary) hypertension: Secondary | ICD-10-CM | POA: Insufficient documentation

## 2017-10-16 DIAGNOSIS — F419 Anxiety disorder, unspecified: Secondary | ICD-10-CM | POA: Insufficient documentation

## 2017-10-16 NOTE — Progress Notes (Signed)
51 y.o. [id number] MarriedAfrican AmericanF here for annual exam.  Doing well.  Had some bloody discharge for about two weeks post op.  Has done well.  No bleeding since.  Pathology from vulvar biopsy reviewed.    PCP:  Dr. Rene KocherEksir.  Last appt was in September.            Sexually active: Yes.    The current method of family planning is tubal ligation.    Exercising: Yes.    some walking Smoker:  no  Health Maintenance:  Pap:  09/08/16 negative, HR HPV negative  04/08/16 negative,  08/27/15 negative, HR HPV +  History of abnormal Pap:  yes MMG:  06/11/17 BIRADS 1 negative/density b Colonoscopy:  2005 BMD:   none TDaP:  2016 Pneumonia vaccine(s):  none Zostavax:   none Hep C testing: none Screening Labs: PCP takes care of labs   reports that  has never smoked. she has never used smokeless tobacco. She reports that she does not drink alcohol or use drugs.  Past Medical History:  Diagnosis Date  . Abnormal Pap smear 8/13   8/13 ASCUS +HPV, 8/14 ASCUS HPV not detected, 06-26-14 ASCUS +HPV HR  . Anemia    WITH PREGNANCY  . Anxiety   . Arthritis    LEFT KNEE  . Hypertension   . Insomnia   . Migraine headache    with aura (visual changes)    Past Surgical History:  Procedure Laterality Date  . APPENDECTOMY  2005  . ARTHROSCOPY WITH ANTERIOR CRUCIATE LIGAMENT (ACL) REPAIR WITH ANTERIOR TIBILIAS GRAFT  03/07/2016    Dr. Thamas JaegersLennon  . BREAST CYST EXCISION Left 6/13  . 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 DILATATION & CURETTAGE/HYSTEROSCOPY WITH MYOSURE N/A 09/15/2017   Procedure: DILATATION & CURETTAGE/HYSTEROSCOPY,WITH ROLLERBALL ABLATION;  Surgeon: Jerene BearsMiller, Kila Godina S, MD;  [facility];  Service: Gynecology;  Laterality: N/A;  . TUBAL LIGATION  1998   BTL    Current Outpatient Medications  Medication Sig Dispense Refill  . Butalbital-Aspirin-Caffeine (BUTALBITAL-ASA-CAFFEINE PO) Take by mouth as needed.    . clobetasol ointment (TEMOVATE) 0.05 % Apply  topically 2 (two) times daily. Do not use for more than 7 days in a row. 30 g 1  . diazepam (VALIUM) 5 MG tablet Take 1 tablet (5 mg total) every 8 (eight) hours as needed by mouth for muscle spasms (cramping). 12 tablet 0  . escitalopram (LEXAPRO) 10 MG tablet Take 15 mg by mouth.    . estradiol (ESTRACE) 0.1 MG/GM vaginal cream 1 gram vaginally twice weekly 42.5 g 1  . gabapentin (NEURONTIN) 100 MG capsule 1 tab nightly for 7 days.  Increase to 2 tabs nightly for 7 days.  Increase if needed to 3 tabs nightly for 7 days. 63 capsule 0  . HYDROcodone-acetaminophen (NORCO/VICODIN) 5-325 MG tablet Take 1-2 tablets every 6 (six) hours as needed by mouth for moderate pain. 15 tablet 0  . ibuprofen (ADVIL,MOTRIN) 800 MG tablet Take 1 tablet (800 mg total) every 8 (eight) hours as needed by mouth. 30 tablet 0  . meloxicam (MOBIC) 15 MG tablet Take 15 mg by mouth daily.    . Multiple Vitamins-Minerals (MULTIVITAMIN PO) Take by mouth daily.    Marland Kitchen. nystatin cream (MYCOSTATIN) Apply 1 application topically 2 (two) times daily. Apply to affected area BID for up to 7 days. 30 g 1  . ondansetron (ZOFRAN) 4 MG tablet Take 1 tablet (4 mg total) every 8 (  eight) hours as needed by mouth for nausea or vomiting. 12 tablet 0  . triamterene-hydrochlorothiazide (DYAZIDE) 37.5-25 MG capsule Take by mouth daily.     No current facility-administered medications for this visit.     Family History  Problem Relation Age of Onset  . Hypertension Mother   . Hypertension Father   . Breast cancer Sister [id number]  . Breast cancer Paternal Aunt [phone]       died of pancreatic cancer age 49    ROS:  Pertinent items are noted in HPI.  Otherwise, a comprehensive ROS was negative.  Exam:   BP:  114/68, R: 16, P:16, Weight:  216,  Ht: 5' 7.5"  General appearance: alert, cooperative and appears stated age Head: Normocephalic, without obvious abnormality, atraumatic Neck: no adenopathy, supple, symmetrical, trachea midline and thyroid  normal to inspection and palpation Lungs: clear to auscultation bilaterally Breasts: normal appearance, no masses or tenderness Heart: regular rate and rhythm Abdomen: soft, non-tender; bowel sounds normal; no masses,  no organomegaly Extremities: extremities normal, atraumatic, no cyanosis or edema Skin: Skin color, texture, turgor normal. No rashes or lesions Lymph nodes: Cervical, supraclavicular, and axillary nodes normal. No abnormal inguinal nodes palpated Neurologic: Grossly normal   Pelvic: External genitalia:  no lesions              Urethra:  normal appearing urethra with no masses, tenderness or lesions              Bartholins and Skenes: normal                 Vagina: normal appearing vagina with normal color and discharge, no lesions              Cervix: no lesions              Pap taken: Yes.   Bimanual Exam:  Uterus:  normal size, contour, position, consistency, mobility, non-tender              Adnexa: normal adnexa and no mass, fullness, tenderness               Rectovaginal: Confirms               Anus:  normal sphincter tone, no lesions  Chaperone was present for exam.  A:  Well Woman with normal exam H/O menorrhagia with irregular cycles, s/p roller ball ablation Hypertension Anxiety H/o abnormal pap smears with +HR HPV.  2017 pap neg with neg HR HPV  P:   Mammogram guidelines reviewed.  Pt UTD. pap smear and HR HPV obtained. Lab work UTD with PCP Vaccines UTD return annually or prn

## 2017-10-20 LAB — CYTOLOGY - PAP
Diagnosis: NEGATIVE
HPV: NOT DETECTED

## 2017-12-17 ENCOUNTER — Other Ambulatory Visit: Payer: Self-pay

## 2017-12-17 ENCOUNTER — Ambulatory Visit (INDEPENDENT_AMBULATORY_CARE_PROVIDER_SITE_OTHER): Payer: BC Managed Care – PPO | Admitting: Certified Nurse Midwife

## 2017-12-17 ENCOUNTER — Encounter: Payer: Self-pay | Admitting: Certified Nurse Midwife

## 2017-12-17 VITALS — BP 110/64 | HR 68 | Resp 16 | Ht 67.5 in | Wt 224.0 lb

## 2017-12-17 DIAGNOSIS — R5383 Other fatigue: Secondary | ICD-10-CM

## 2017-12-17 DIAGNOSIS — N952 Postmenopausal atrophic vaginitis: Secondary | ICD-10-CM | POA: Diagnosis not present

## 2017-12-17 DIAGNOSIS — N898 Other specified noninflammatory disorders of vagina: Secondary | ICD-10-CM | POA: Diagnosis not present

## 2017-12-17 DIAGNOSIS — R309 Painful micturition, unspecified: Secondary | ICD-10-CM

## 2017-12-17 LAB — POCT URINALYSIS DIPSTICK
Bilirubin, UA: NEGATIVE
Blood, UA: NEGATIVE
Glucose, UA: NEGATIVE
Ketones, UA: NEGATIVE
Leukocytes, UA: NEGATIVE
Nitrite, UA: NEGATIVE
Protein, UA: NEGATIVE
Urobilinogen, UA: NEGATIVE E.U./dL — AB
pH, UA: 5 (ref 5.0–8.0)

## 2017-12-17 NOTE — Progress Notes (Signed)
52 y.o. Married [place] female [phone] here with complaint of vaginal symptoms of  burning, and increase odorous discharge. Describes discharge as brown beige with odor. Has noted vaginal dryness since ablation in 11/19.Marland Kitchen. Onset of symptoms  2 weeks ago. Denies new personal products. No STD concerns. Urinary symptoms none . Contraception is tubal. Having some fatigue with ? depression, but walking daily 30 minutes. This is helping and also working on weight loss. No other health issues.  ROS Pertinent to above  O:Healthy female WDWN Affect: normal, orientation x 3  Exam:Skin: warm and dry Abdomen:soft, non tender, negative suprapubic  Inguinal Lymph nodes: no enlargement or tenderness Pelvic exam: External genital: normal female, no scaling or exudate BUS: negative Vagina: atrophic appearance scant white non odorus discharge noted.   Affirm taken Cervix: normal, non tender, no CMT Uterus: normal, non tender Adnexa:normal, non tender, no masses or fullness noted   A:Normal pelvic exam Atrophic vaginitis R/O vaginal infection Fatigue history of vitamin d deficiency and anemia   P:Discussed findings of normal pelvic exam.  Discussed finding of atrophic vaginitis and needs to resume Premarin cream use twice weekly and this should resolve. Can also coconut oil for lubrication. Lab: Affirm  Aveeno or baking soda sitz bath for comfort. Avoid moist . Coconut Oil use for skin protection prior to activity can be used to external skin for protection or dryness. Lab: Vitamin D, CBC  Rv prn

## 2017-12-18 ENCOUNTER — Other Ambulatory Visit: Payer: Self-pay | Admitting: Certified Nurse Midwife

## 2017-12-18 ENCOUNTER — Other Ambulatory Visit: Payer: Self-pay

## 2017-12-18 DIAGNOSIS — E559 Vitamin D deficiency, unspecified: Secondary | ICD-10-CM

## 2017-12-18 LAB — VAGINITIS/VAGINOSIS, DNA PROBE
Candida Species: NEGATIVE
Gardnerella vaginalis: NEGATIVE
Trichomonas vaginosis: NEGATIVE

## 2017-12-18 LAB — CBC
Hematocrit: 34.6 % (ref 34.0–46.6)
Hemoglobin: 11.6 g/dL (ref 11.1–15.9)
MCH: 30.6 pg (ref 26.6–33.0)
MCHC: 33.5 g/dL (ref 31.5–35.7)
MCV: 91 fL (ref 79–97)
Platelets: 257 10*3/uL (ref 150–379)
RBC: 3.79 x10E6/uL (ref 3.77–5.28)
RDW: 13.8 % (ref 12.3–15.4)
WBC: 4.6 10*3/uL (ref 3.4–10.8)

## 2017-12-18 LAB — VITAMIN D 25 HYDROXY (VIT D DEFICIENCY, FRACTURES): Vit D, 25-Hydroxy: 16.6 ng/mL — ABNORMAL LOW (ref 30.0–100.0)

## 2017-12-18 MED ORDER — VITAMIN D (ERGOCALCIFEROL) 1.25 MG (50000 UNIT) PO CAPS: 50000.0000 [IU] | ORAL_CAPSULE | ORAL | 0 refills | Status: DC

## 2017-12-25 ENCOUNTER — Ambulatory Visit: Payer: BC Managed Care – PPO | Admitting: Obstetrics & Gynecology

## 2018-01-19 DIAGNOSIS — Z6839 Body mass index (BMI) 39.0-39.9, adult: Secondary | ICD-10-CM | POA: Insufficient documentation

## 2018-01-21 ENCOUNTER — Other Ambulatory Visit: Payer: Self-pay

## 2018-01-21 ENCOUNTER — Encounter (HOSPITAL_BASED_OUTPATIENT_CLINIC_OR_DEPARTMENT_OTHER): Payer: Self-pay | Admitting: Emergency Medicine

## 2018-01-21 ENCOUNTER — Emergency Department (HOSPITAL_BASED_OUTPATIENT_CLINIC_OR_DEPARTMENT_OTHER)
Admission: EM | Admit: 2018-01-21 | Discharge: 2018-01-21 | Disposition: A | Discharge status: Home / Self Care | Payer: BC Managed Care – PPO | Attending: Emergency Medicine | Admitting: Emergency Medicine

## 2018-01-21 DIAGNOSIS — E860 Dehydration: Secondary | ICD-10-CM | POA: Diagnosis not present

## 2018-01-21 DIAGNOSIS — I10 Essential (primary) hypertension: Secondary | ICD-10-CM | POA: Diagnosis not present

## 2018-01-21 DIAGNOSIS — J09X2 Influenza due to identified novel influenza A virus with other respiratory manifestations: Secondary | ICD-10-CM | POA: Insufficient documentation

## 2018-01-21 DIAGNOSIS — Z79899 Other long term (current) drug therapy: Secondary | ICD-10-CM | POA: Insufficient documentation

## 2018-01-21 DIAGNOSIS — R55 Syncope and collapse: Secondary | ICD-10-CM

## 2018-01-21 DIAGNOSIS — J101 Influenza due to other identified influenza virus with other respiratory manifestations: Secondary | ICD-10-CM

## 2018-01-21 LAB — CBC WITH DIFFERENTIAL/PLATELET
Basophils Absolute: 0 10*3/uL (ref 0.0–0.1)
Basophils Relative: 0 %
Eosinophils Absolute: 0 10*3/uL (ref 0.0–0.7)
Eosinophils Relative: 0 %
HCT: 41.3 % (ref 36.0–46.0)
Hemoglobin: 14.2 g/dL (ref 12.0–15.0)
Lymphocytes Relative: 31 %
Lymphs Abs: 1.8 10*3/uL (ref 0.7–4.0)
MCH: 30.7 pg (ref 26.0–34.0)
MCHC: 34.4 g/dL (ref 30.0–36.0)
MCV: 89.2 fL (ref 78.0–100.0)
Monocytes Absolute: 1.1 10*3/uL — ABNORMAL HIGH (ref 0.1–1.0)
Monocytes Relative: 19 %
Neutro Abs: 2.9 10*3/uL (ref 1.7–7.7)
Neutrophils Relative %: 50 %
Platelets: 210 10*3/uL (ref 150–400)
RBC: 4.63 MIL/uL (ref 3.87–5.11)
RDW: 13.8 % (ref 11.5–15.5)
WBC: 5.9 10*3/uL (ref 4.0–10.5)

## 2018-01-21 LAB — CBG MONITORING, ED: Glucose-Capillary: 75 mg/dL (ref 65–99)

## 2018-01-21 LAB — BASIC METABOLIC PANEL
Anion gap: 11 (ref 5–15)
BUN: 12 mg/dL (ref 6–20)
CO2: 25 mmol/L (ref 22–32)
Calcium: 9.2 mg/dL (ref 8.9–10.3)
Chloride: 96 mmol/L — ABNORMAL LOW (ref 101–111)
Creatinine, Ser: 0.97 mg/dL (ref 0.44–1.00)
GFR calc Af Amer: >60 mL/min (ref >60)
GFR calc non Af Amer: >60 mL/min (ref >60)
Glucose, Bld: 121 mg/dL — ABNORMAL HIGH (ref 65–99)
Potassium: 3.6 mmol/L (ref 3.5–5.1)
Sodium: 132 mmol/L — ABNORMAL LOW (ref 135–145)

## 2018-01-21 MED ORDER — SODIUM CHLORIDE 0.9 % IV BOLUS (SEPSIS)
1000.0000 mL | Freq: Once | INTRAVENOUS | Status: AC
  Administered 2018-01-21: 1000 mL via INTRAVENOUS

## 2018-01-21 MED ORDER — ACETAMINOPHEN 325 MG PO TABS
650.0000 mg | ORAL_TABLET | Freq: Once | ORAL | Status: AC
  Administered 2018-01-21: 650 mg via ORAL
  Filled 2018-01-21: qty 2

## 2018-01-21 NOTE — ED Provider Notes (Signed)
[facility] EMERGENCY DEPARTMENT Provider Note   CSN: [id number] Arrival date & time: 01/21/18  1215     History   Chief Complaint Chief Complaint  Patient presents with  . Influenza  . Near Syncope    HPI Michaela Hanson is a 52 y.o. female.  HPI  52 year old female with history of hypertension and anxiety presents with syncope and influenza.  Patient was at urgent care because she is been feeling poorly since 3/11.  She states she has had a headache, scratchy throat, mild cough, and sneezing.  Hot and cold chills.  She had a temperature 100.3 at urgent care.  She states they swabbed her for flu and she was positive.  She was given a prescription for Tamiflu but has not been able to fill it yet.  While at the urgent care she had a syncopal episode.  She was transported here by EMS.  She was coming in from the waiting room and states she felt like she is going to pass out and started to slide in the chair but was awake the whole time.  She states she has been lightheaded since the symptoms started on 3/11.  Denies chest pain, shortness of breath or focal weakness/numbness.  Past Medical History:  Diagnosis Date  . Abnormal Pap smear 8/13   8/13 ASCUS +HPV, 8/14 ASCUS HPV not detected, 06-26-14 ASCUS +HPV HR  . Anemia    WITH PREGNANCY  . Anxiety   . Arthritis    LEFT KNEE  . Hypertension   . Insomnia   . Migraine headache    with aura (visual changes)    Patient Active Problem List   Diagnosis Date Noted  . Essential hypertension 07/22/2017  . Melasma 04/23/2016  . Left ACL tear 03/07/2016  . Dysplasia of cervix, low grade (CIN 1) 09/21/2015  . H/O LEEP 09/21/2015  . 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 Generalized anxiety disorder 05/07/2015  . Migraine without status migrainosus, not intractable 05/07/2015  . Back injury 11/23/2013    Class: Acute    Past Surgical History:  Procedure Laterality Date  . APPENDECTOMY  2005  . ARTHROSCOPY  WITH ANTERIOR CRUCIATE LIGAMENT (ACL) REPAIR WITH ANTERIOR TIBILIAS GRAFT  03/07/2016    Dr. Thamas Jaegers  . BREAST CYST EXCISION Left 6/13  . 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 DILATATION & CURETTAGE/HYSTEROSCOPY WITH MYOSURE N/A 09/15/2017   Procedure: DILATATION & CURETTAGE/HYSTEROSCOPY,WITH ROLLERBALL ABLATION;  Surgeon: Jerene Bears, MD;  Location: [facility];  Service: Gynecology;  Laterality: N/A;  . TUBAL LIGATION  1998   BTL    OB History    Gravida Para Term Preterm AB Living   2 2 2     2    SAB TAB Ectopic Multiple Live Births           2       Home Medications    Prior to Admission medications   Medication Sig Start Date End Date Taking? Authorizing Provider  Butalbital-Aspirin-Caffeine (BUTALBITAL-ASA-CAFFEINE PO) Take by mouth as needed.    [provider]  clobetasol ointment (TEMOVATE) 0.05 % Apply topically 2 (two) times daily. Do not use for more than 7 days in a row. 03/27/17   Jerene Bears, MD  escitalopram (LEXAPRO) 10 MG tablet Take 15 mg by mouth. 05/26/16   [provider]  estradiol (ESTRACE) 0.1 MG/GM vaginal cream 1 gram vaginally twice weekly 09/08/16   Jerene Bears, MD  ibuprofen (ADVIL,MOTRIN) 800 MG tablet Take 1 tablet (800 mg total) every 8 (eight) hours as needed by mouth. 09/15/17   Jerene Bears, MD  Multiple Vitamins-Minerals (MULTIVITAMIN PO) Take by mouth daily.    [provider]  triamterene-hydrochlorothiazide (DYAZIDE) 37.5-25 MG capsule Take by mouth. Takes 1/2 daily    [provider]  Vitamin D, Ergocalciferol, (DRISDOL) 50000 units CAPS capsule Take 1 capsule (50,000 Units total) by mouth every 7 (seven) days. 12/18/17   Verner Chol, CNM    Family History Family History  Problem Relation Age of Onset  . Hypertension Mother   . Hypertension Father   . Breast cancer Sister 75  . Breast cancer Paternal Aunt 58       died of pancreatic cancer age 59    Social  History Social History   Tobacco Use  . Smoking status: Never Smoker  . Smokeless tobacco: Never Used  Substance Use Topics  . Alcohol use: No  . Drug use: No     Allergies   Flagyl [metronidazole]   Review of Systems Review of Systems  Constitutional: Positive for chills and fever.  HENT: Positive for sore throat.   Respiratory: Positive for cough. Negative for shortness of breath.   Cardiovascular: Negative for chest pain.  Gastrointestinal: Positive for nausea. Negative for abdominal pain, diarrhea and vomiting.  Neurological: Positive for syncope, light-headedness and headaches.  All other systems reviewed and are negative.    Physical Exam Updated Vital Signs BP 117/81   Pulse 75   Temp 100.3 F (37.9 C) (Oral)   Resp 12   Ht 5\' 8"  (1.727 m)   Wt 103.4 kg (228 lb)   SpO2 100%   BMI 34.67 kg/m   Physical Exam  Constitutional: She is oriented to person, place, and time. She appears well-developed and well-nourished.  HENT:  Head: Normocephalic and atraumatic.  Right Ear: External ear normal.  Left Ear: External ear normal.  Nose: Nose normal.  Mouth/Throat: No oropharyngeal exudate.  Eyes: EOM are normal. Pupils are equal, round, and reactive to light. Right eye exhibits no discharge. Left eye exhibits no discharge.  Neck: Normal range of motion. Neck supple.  Cardiovascular: Normal rate, regular rhythm and normal heart sounds.  Pulmonary/Chest: Effort normal and breath sounds normal.  Abdominal: Soft. There is no tenderness.  Neurological: She is alert and oriented to person, place, and time.  CN 3-12 grossly intact. 5/5 strength in all 4 extremities. Grossly normal sensation. Normal finger to nose.   Skin: Skin is warm and dry. She is not diaphoretic.  Nursing note and vitals reviewed.    ED Treatments / Results  Labs (all labs ordered are listed, but only abnormal results are displayed) Labs Reviewed  BASIC METABOLIC PANEL - Abnormal; Notable for  the following components:      Result Value   Sodium 132 (*)    Chloride 96 (*)    Glucose, Bld 121 (*)    All other components within normal limits  CBC WITH DIFFERENTIAL/PLATELET - Abnormal; Notable for the following components:   Monocytes Absolute 1.1 (*)    All other components within normal limits  PREGNANCY, URINE  CBG MONITORING, ED    EKG  EKG Interpretation  Date/Time:  Thursday January 21 2018 12:51:18 EDT Ventricular Rate:  72 PR Interval:    QRS Duration: 111 QT Interval:  386 QTC Calculation: 423 R Axis:   19 Text Interpretation:  Sinus rhythm Prolonged PR interval RSR' in  V1 or V2, right VCD or RVH Confirmed by Pricilla LovelessGoldston, Damare Serano [phone]([id number]) on 01/21/2018 12:53:42 PM       Radiology No results found.  Procedures Procedures (including critical care time)  Medications Ordered in ED Medications  acetaminophen (TYLENOL) tablet 650 mg (650 mg Oral Given 01/21/18 1239)  sodium chloride 0.9 % bolus 1,000 mL (0 mLs Intravenous Stopped 01/21/18 1408)  sodium chloride 0.9 % bolus 1,000 mL (0 mLs Intravenous Stopped 01/21/18 1408)     Initial Impression / Assessment and Plan / ED Course  I have reviewed the triage vital signs and the nursing notes.  Pertinent labs & imaging results that were available during my care of the patient were reviewed by me and considered in my medical decision making (see chart for details).     Patient's lab work is reassuring with no significant abnormalities noted.  She feels better after IV fluids.  She has a low-grade temperature of 100.3 but otherwise vitals are benign.  Likely has had some dehydration to go along with overall poor feeling from influenza.  ECG unremarkable.  My suspicion for a cardiac cause of syncope/near syncope is low.  Neuro exam benign.  She appears stable for discharge home with increased fluid intake and supportive care for the influenza.  Discussed return precautions.  Final Clinical Impressions(s) / ED Diagnoses    Final diagnoses:  Influenza A  Near syncope  Dehydration    ED Discharge Orders    None       Pricilla LovelessGoldston, Tammy Ericsson, MD 01/21/18 1420

## 2018-01-21 NOTE — ED Triage Notes (Addendum)
[facility] dx with flu today.  UC stated pt has hypotensive and had a syncopal episode. EMS reports she has not been hypotensive with them.

## 2018-01-21 NOTE — ED Notes (Signed)
While in triage waiting room pt was verbally announcing she was going to pass out. Called pt in to triage room to get pt's vs and pt was trying to slide down in the chair while getting vs I had to ask pt to sit up several times, when RN told pt she was going to get pt RX for the low grade fever pt tried to side down in the chair again and rolled her eyes saying she was passing out. Pt then with eyes open she rolled her eyes up during this I checked pt's radial pulse it was regular rate and rhythm. Pt  flinched when doing the sternum rub to check responsiveness .  While transporting pt to the room pt was still slumping down in the chair pt slid further down In the chair. I stopped called for assistance to pull pt back up so pt would not fall. Staff came we pulled pt back up in the chair I continued with transporting pt to room 6.  Consulting civil engineerCharge RN and ER Doctor notified of this.

## 2018-02-03 ENCOUNTER — Encounter (INDEPENDENT_AMBULATORY_CARE_PROVIDER_SITE_OTHER): Payer: Self-pay | Admitting: Orthopaedic Surgery

## 2018-02-03 ENCOUNTER — Ambulatory Visit (INDEPENDENT_AMBULATORY_CARE_PROVIDER_SITE_OTHER): Payer: BC Managed Care – PPO | Admitting: Orthopaedic Surgery

## 2018-02-03 ENCOUNTER — Ambulatory Visit (INDEPENDENT_AMBULATORY_CARE_PROVIDER_SITE_OTHER): Payer: BC Managed Care – PPO

## 2018-02-03 DIAGNOSIS — M1712 Unilateral primary osteoarthritis, left knee: Secondary | ICD-10-CM | POA: Diagnosis not present

## 2018-02-03 NOTE — Progress Notes (Signed)
Office Visit Note   Patient: Michaela Hanson           Date of Birth: 03/04/1966           MRN: [id number] Visit Date: 02/03/2018              Requested by: Michaela Hanson, Michaela Hanson [address] [place], [place] [id number] PCP: Michaela Hanson, Michaela Hanson   Assessment & Plan: Visit Diagnoses:  1. Primary osteoarthritis of left knee     Plan: Discussed with her knee friendly exercises and quad strengthening exercises shown.  Also discussed with her weight loss.  This point time would recommend supplemental injection left knee due to the fact that she had some is one injection in the right knee and had good relief with this.  We will see her back in approximately 2 weeks for a Synvisc 1 injection.  Questions were encouraged and answered at length.  Follow-Up Instructions: Return in about 2 weeks (around 02/17/2018) for Supplemental injection.   Orders:  Orders Placed This Encounter  Procedures  . XR KNEE 3 VIEW LEFT   No orders of the defined types were placed in this encounter.     Procedures: No procedures performed   Clinical Data: No additional findings.   Subjective: Chief Complaint  Patient presents with  . Left Knee - Pain, Follow-up    HPI Michaela Hashimotoatricia is a 52 year old female who was seen for the first time for left knee pain. She did go to high school [employer] and knows him well.  She reports a slip and fall at work in 2015.  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  She eventually underwent a left knee [facility].  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  She is had several cortisone injections in the knee but these are just not giving her results for any length of time at this point.  Here for a second opinion.   She states that she did have  physical therapy after knee arthroscopy.  She is now having some catching sensation in the knee occasionally.  Pain is worse whenever she is sitting for prolonged period of time or going up or down stairs.  She takes ibuprofen with in with very little relief.  She also had an injury to her right knee and underwent a supplemental injection with Synvisc 1 in the right knee is done well since the injection. Review of Systems Please see HPI otherwise negative  Objective: Vital Signs: There were no vitals taken for this visit.  Physical Exam  Constitutional: She is oriented to person, place, and time. She appears well-developed and well-nourished. No distress.  Pulmonary/Chest: Effort normal.  Neurological: She is alert and oriented to person, place, and time.  Skin: She is not diaphoretic.  Psychiatric: She has a normal mood and affect.    Ortho Exam Bilateral knees she has tenderness over the medial joint line of both knees and over the pes anserinus area.  Left knee with tenderness peripatellar region.  No instability valgus varus stressing of either knee.  She has good range of motion of both knees.  Calves are supple and nontender bilaterally. Specialty Comments:  No specialty comments available.  Imaging: Xr Knee 3 View Left  Result  Date: 02/03/2018 Left knee: No subluxation dislocation.  Lateralization of the patella on the AP and the sunrise view.  Mild patellofemoral changes.  Moderate medial joint line narrowing.  No acute fractures.    PMFS History: Patient Active Problem List   Diagnosis Date Noted  . Essential hypertension 07/22/2017  . Melasma 04/23/2016  . Left ACL tear 03/07/2016  . Dysplasia of cervix, low grade (CIN 1) 09/21/2015  . H/O LEEP 09/21/2015  . 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 Generalized anxiety disorder 05/07/2015  . Migraine without status migrainosus, not intractable 05/07/2015  . Back injury 11/23/2013    Class: Acute   Past  Medical History:  Diagnosis Date  . Abnormal Pap smear 8/13   8/13 ASCUS +HPV, 8/14 ASCUS HPV not detected, 06-26-14 ASCUS +HPV HR  . Anemia    WITH PREGNANCY  . Anxiety   . Arthritis    LEFT KNEE  . Hypertension   . Insomnia   . Migraine headache    with aura (visual changes)    Family History  Problem Relation Age of Onset  . Hypertension Mother   . Hypertension Father   . Breast cancer Sister 24  . Breast cancer Paternal Aunt 96       died of pancreatic cancer age 24    Past Surgical History:  Procedure Laterality Date  . APPENDECTOMY  2005  . ARTHROSCOPY WITH ANTERIOR CRUCIATE LIGAMENT (ACL) REPAIR WITH ANTERIOR TIBILIAS GRAFT  03/07/2016    Michaela Hanson  . BREAST CYST EXCISION Left 6/13  . 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 DILATATION & CURETTAGE/HYSTEROSCOPY WITH MYOSURE N/A 09/15/2017   Procedure: DILATATION & CURETTAGE/HYSTEROSCOPY,WITH ROLLERBALL ABLATION;  Surgeon: Michaela Bears, Hanson;  Location: [facility];  Service: Gynecology;  Laterality: N/A;  . TUBAL LIGATION  1998   BTL   Social History   Occupational History  . Not on file  Tobacco Use  . Smoking status: Never Smoker  . Smokeless tobacco: Never Used  Substance and Sexual Activity  . Alcohol use: No  . Drug use: No  . Sexual activity: Yes    Partners: Male    Birth control/protection: Surgical    Comment: BTL

## 2018-02-04 ENCOUNTER — Ambulatory Visit (INDEPENDENT_AMBULATORY_CARE_PROVIDER_SITE_OTHER): Payer: BC Managed Care – PPO | Admitting: Orthopaedic Surgery

## 2018-02-04 ENCOUNTER — Telehealth (INDEPENDENT_AMBULATORY_CARE_PROVIDER_SITE_OTHER): Payer: Self-pay

## 2018-02-04 NOTE — Telephone Encounter (Signed)
Submitted application online for SynviscOne, left knee. 

## 2018-02-12 ENCOUNTER — Telehealth (INDEPENDENT_AMBULATORY_CARE_PROVIDER_SITE_OTHER): Payer: Self-pay

## 2018-02-12 NOTE — Telephone Encounter (Signed)
Faxed completed authorization to [facility] at [phone].

## 2018-02-14 ENCOUNTER — Encounter: Payer: Self-pay | Admitting: Emergency Medicine

## 2018-02-14 ENCOUNTER — Emergency Department (HOSPITAL_BASED_OUTPATIENT_CLINIC_OR_DEPARTMENT_OTHER)
Admission: EM | Admit: 2018-02-14 | Discharge: 2018-02-15 | Disposition: A | Discharge status: Home / Self Care | Payer: BC Managed Care – PPO | Attending: Emergency Medicine | Admitting: Emergency Medicine

## 2018-02-14 ENCOUNTER — Emergency Department (HOSPITAL_BASED_OUTPATIENT_CLINIC_OR_DEPARTMENT_OTHER): Payer: BC Managed Care – PPO

## 2018-02-14 ENCOUNTER — Other Ambulatory Visit: Payer: Self-pay

## 2018-02-14 DIAGNOSIS — S335XXA Sprain of ligaments of lumbar spine, initial encounter: Secondary | ICD-10-CM

## 2018-02-14 DIAGNOSIS — Y9301 Activity, walking, marching and hiking: Secondary | ICD-10-CM | POA: Diagnosis not present

## 2018-02-14 DIAGNOSIS — Y929 Unspecified place or not applicable: Secondary | ICD-10-CM | POA: Insufficient documentation

## 2018-02-14 DIAGNOSIS — Y999 Unspecified external cause status: Secondary | ICD-10-CM | POA: Insufficient documentation

## 2018-02-14 DIAGNOSIS — S339XXA Sprain of unspecified parts of lumbar spine and pelvis, initial encounter: Secondary | ICD-10-CM | POA: Diagnosis not present

## 2018-02-14 DIAGNOSIS — I10 Essential (primary) hypertension: Secondary | ICD-10-CM | POA: Diagnosis not present

## 2018-02-14 DIAGNOSIS — S99912A Unspecified injury of left ankle, initial encounter: Secondary | ICD-10-CM | POA: Diagnosis present

## 2018-02-14 DIAGNOSIS — W19XXXA Unspecified fall, initial encounter: Secondary | ICD-10-CM

## 2018-02-14 DIAGNOSIS — S93401A Sprain of unspecified ligament of right ankle, initial encounter: Secondary | ICD-10-CM | POA: Diagnosis not present

## 2018-02-14 DIAGNOSIS — W109XXA Fall (on) (from) unspecified stairs and steps, initial encounter: Secondary | ICD-10-CM | POA: Insufficient documentation

## 2018-02-14 DIAGNOSIS — S139XXA Sprain of joints and ligaments of unspecified parts of neck, initial encounter: Secondary | ICD-10-CM | POA: Diagnosis not present

## 2018-02-14 DIAGNOSIS — Z79899 Other long term (current) drug therapy: Secondary | ICD-10-CM | POA: Diagnosis not present

## 2018-02-14 DIAGNOSIS — S93402A Sprain of unspecified ligament of left ankle, initial encounter: Secondary | ICD-10-CM

## 2018-02-14 MED ORDER — OXYCODONE-ACETAMINOPHEN 5-325 MG PO TABS: 1.0000 | ORAL_TABLET | ORAL | Status: DC | PRN

## 2018-02-14 MED ORDER — IBUPROFEN 400 MG PO TABS
400.0000 mg | ORAL_TABLET | Freq: Once | ORAL | Status: AC | PRN
  Administered 2018-02-14: 400 mg via ORAL
  Filled 2018-02-14: qty 1

## 2018-02-14 NOTE — ED Provider Notes (Signed)
[facility] EMERGENCY DEPARTMENT Provider Note   CSN: [id number] Arrival date & time: 02/14/18  2102     History   Chief Complaint Chief Complaint  Patient presents with  . Fall    HPI Tucker Steedley is a 52 y.o. female.  Patient is a 52 year old female who presents with pain after a fall.  This was her 52th birthday.  She was walking outside after her party and was turning around and accidentally stepped off a step, causing her to fall down about 4-5 steps.  She landed on her right side.  There is no loss of consciousness.  She denies any dizziness.  No nausea or vomiting.  She has pain to her neck and back as well as her right shoulder and right ankle.  She does have some abrasions.  Her last tetanus shot was 2 years ago.  She denies any chest or abdominal pain.  This happened just prior to arrival when she has not taken anything prior to arrival.     Past Medical History:  Diagnosis Date  . Abnormal Pap smear 8/13   8/13 ASCUS +HPV, 8/14 ASCUS HPV not detected, 06-26-14 ASCUS +HPV HR  . Anemia    WITH PREGNANCY  . Anxiety   . Arthritis    LEFT KNEE  . Hypertension   . Insomnia   . Migraine headache    with aura (visual changes)    Patient Active Problem List   Diagnosis Date Noted  . Essential hypertension 07/22/2017  . Melasma 04/23/2016  . Left ACL tear 03/07/2016  . Dysplasia of cervix, low grade (CIN 1) 09/21/2015  . H/O LEEP 09/21/2015  . 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 Generalized anxiety disorder 05/07/2015  . Migraine without status migrainosus, not intractable 05/07/2015  . Back injury 11/23/2013    Class: Acute    Past Surgical History:  Procedure Laterality Date  . APPENDECTOMY  2005  . ARTHROSCOPY WITH ANTERIOR CRUCIATE LIGAMENT (ACL) REPAIR WITH ANTERIOR TIBILIAS GRAFT  03/07/2016    Dr. Thamas Jaegers  . BREAST CYST EXCISION Left 6/13  . 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 DILATATION & CURETTAGE/HYSTEROSCOPY WITH  MYOSURE N/A 09/15/2017   Procedure: DILATATION & CURETTAGE/HYSTEROSCOPY,WITH ROLLERBALL ABLATION;  Surgeon: Jerene Bears, MD;  Location: [facility];  Service: Gynecology;  Laterality: N/A;  . TUBAL LIGATION  1998   BTL     OB History    Gravida  2   Para  2   Term  2   Preterm      AB      Living  2     SAB      TAB      Ectopic      Multiple      Live Births  2            Home Medications    Prior to Admission medications   Medication Sig Start Date End Date Taking? Authorizing Provider  clobetasol ointment (TEMOVATE) 0.05 % Apply to affected areas daily for one week, then 4 times per weeks. Not to face. 12/17/17  Yes [provider]  escitalopram (LEXAPRO) 20 MG tablet Take by mouth. 06/08/17  Yes [provider]  Butalbital-Aspirin-Caffeine (BUTALBITAL-ASA-CAFFEINE PO) Take by mouth as needed.    [provider]  clobetasol ointment (TEMOVATE) 0.05 % Apply topically 2 (two) times daily. Do not use for more than 7 days in a row. 03/27/17   Hyacinth Meeker,  Elnita Maxwell, MD  escitalopram (LEXAPRO) 10 MG tablet Take 15 mg by mouth. 05/26/16   [provider]  estradiol (ESTRACE) 0.1 MG/GM vaginal cream 1 gram vaginally twice weekly 09/08/16   Jerene Bears, MD  ibuprofen (ADVIL,MOTRIN) 800 MG tablet Take 1 tablet (800 mg total) every 8 (eight) hours as needed by mouth. 09/15/17   Jerene Bears, MD  Multiple Vitamins-Minerals (MULTIVITAMIN PO) Take by mouth daily.    [provider]  triamterene-hydrochlorothiazide (DYAZIDE) 37.5-25 MG capsule Take by mouth. Takes 1/2 daily    [provider]  triamterene-hydrochlorothiazide (MAXZIDE-25) 37.5-25 MG tablet Take by mouth.    [provider]  Vitamin D, Ergocalciferol, (DRISDOL) 50000 units CAPS capsule Take 1 capsule (50,000 Units total) by mouth every 7 (seven) days. 12/18/17   Verner Chol, CNM    Family History Family History  Problem Relation Age  of Onset  . Hypertension Mother   . Hypertension Father   . Breast cancer Sister 28  . Breast cancer Paternal Aunt 48       died of pancreatic cancer age 22    Social History Social History   Tobacco Use  . Smoking status: Never Smoker  . Smokeless tobacco: Never Used  Substance Use Topics  . Alcohol use: No  . Drug use: No     Allergies   Flagyl [metronidazole]   Review of Systems Review of Systems  Constitutional: Negative for activity change, appetite change and fever.  HENT: Negative for dental problem, nosebleeds and trouble swallowing.   Eyes: Negative for pain and visual disturbance.  Respiratory: Negative for shortness of breath.   Cardiovascular: Negative for chest pain.  Gastrointestinal: Negative for abdominal pain, nausea and vomiting.  Genitourinary: Negative for dysuria and hematuria.  Musculoskeletal: Positive for arthralgias, back pain and neck pain. Negative for joint swelling.  Skin: Positive for wound.  Neurological: Positive for headaches. Negative for weakness and numbness.  Psychiatric/Behavioral: Negative for confusion.     Physical Exam Updated Vital Signs BP (!) 144/97   Pulse 84   Temp 98 F (36.7 C) (Oral)   Resp 18   Wt 103.4 kg (228 lb)   SpO2 98%   BMI 34.67 kg/m   Physical Exam  Constitutional: She is oriented to person, place, and time. She appears well-developed and well-nourished.  HENT:  Head: Normocephalic and atraumatic.  Eyes: Pupils are equal, round, and reactive to light.  Neck: Normal range of motion. Neck supple.  Patient has tenderness to the right cervical paraspinal area and along the right trapezius muscle.  There is no tenderness to the thoracic spine.  There is tenderness throughout the lumbosacral spine.  No step-offs or deformities are noted.  Cardiovascular: Normal rate, regular rhythm and normal heart sounds.  No signs of external trauma to the chest or abdomen  Pulmonary/Chest: Effort normal and breath  sounds normal. No respiratory distress. She has no wheezes. She has no rales. She exhibits no tenderness.  Abdominal: Soft. Bowel sounds are normal. There is no tenderness. There is no rebound and no guarding.  Musculoskeletal: Normal range of motion. She exhibits no edema.  Patient has swelling over the lateral malleoli of the right ankle.  There is some overlying abrasions.  There is some abrasions to the right lower leg as well.  There is tenderness over the lateral malleolus of the right ankle.  There is no pain to the foot.  No pain to the knee or hip.  There is  also some mild swelling over the lateral malleolus of the left ankle with some underlying tenderness.  There is no pain to the foot or the remainder of the leg.  Pedal pulses are intact.  She has some pain on palpation of her right shoulder.  She has no other pain on palpation or range of motion of her extremities.  She has normal sensation to light touch in all extremities.  Pedal pulses are intact in all extremities.  She has normal motor function in all extremities.  Lymphadenopathy:    She has no cervical adenopathy.  Neurological: She is alert and oriented to person, place, and time.  Skin: Skin is warm and dry. No rash noted.  Psychiatric: She has a normal mood and affect.     ED Treatments / Results  Labs (all labs ordered are listed, but only abnormal results are displayed) Labs Reviewed - No data to display  EKG None  Radiology Dg Lumbar Spine Complete  Result Date: 02/14/2018 CLINICAL DATA:  52 y/o F; fall on the front steps. Midline lower back pain. EXAM: LUMBAR SPINE - COMPLETE 4+ VIEW COMPARISON:  None. FINDINGS: There is no evidence of lumbar spine fracture. Minimal reverse S curvature of the lumbar spine. Moderate L4-5 and mild L5-S1 loss of intervertebral disc space height with marginal osteophytes. Lower lumbar facet arthrosis. IMPRESSION: 1. No acute fracture or dislocation. 2. Lumbar spondylosis greatest at L4-5  and L5-S1 levels. Electronically Signed   By: Mitzi Hansen M.D.   On: 02/14/2018 23:23   Dg Shoulder Right  Result Date: 02/14/2018 CLINICAL DATA:  Status post fall today with right shoulder pain. EXAM: RIGHT SHOULDER - 2+ VIEW COMPARISON:  None. FINDINGS: There is no evidence of fracture or dislocation. There is no evidence of arthropathy or other focal bone abnormality. Soft tissues are unremarkable. IMPRESSION: Negative. Electronically Signed   By: Sherian Rein M.D.   On: 02/14/2018 22:10   Dg Ankle Complete Left  Result Date: 02/14/2018 CLINICAL DATA:  52 y/o F; fall down front steps with left ankle swelling of the lateral malleolus. EXAM: LEFT ANKLE COMPLETE - 3+ VIEW COMPARISON:  None. FINDINGS: There is no evidence of fracture, dislocation, or joint effusion. There is no evidence of arthropathy or other focal bone abnormality. Soft tissue swelling greatest over the lateral malleolus. IMPRESSION: No acute bony or articular abnormality identified. Soft tissue swelling greatest over the lateral malleolus. Electronically Signed   By: Mitzi Hansen M.D.   On: 02/14/2018 23:20   Dg Ankle Complete Right  Result Date: 02/14/2018 CLINICAL DATA:  Status post fall today with right ankle pain. EXAM: RIGHT ANKLE - COMPLETE 3+ VIEW COMPARISON:  None. FINDINGS: There is no evidence of fracture, dislocation, or joint effusion. There is no evidence of arthropathy or other focal bone abnormality. Mild generalized soft tissue swelling is identified in the right ankle. IMPRESSION: No acute fracture or dislocation. Soft tissue swelling of right ankle. Electronically Signed   By: Sherian Rein M.D.   On: 02/14/2018 22:10   Ct Cervical Spine Wo Contrast  Result Date: 02/14/2018 CLINICAL DATA:  Larey Seat down steps tonight with pain in neck and right shoulder. EXAM: CT CERVICAL SPINE WITHOUT CONTRAST TECHNIQUE: Multidetector CT imaging of the cervical spine was performed without intravenous contrast.  Multiplanar CT image reconstructions were also generated. COMPARISON:  None. FINDINGS: Alignment: Straightening of normal cervical lordosis. No subluxation. Skull base and vertebrae: No acute fracture. No primary bone lesion or focal pathologic process. Soft tissues  and spinal canal: No prevertebral fluid or swelling. No visible canal hematoma. Disc levels:  Loss of disc height noted at C4-5 and C5-6. Upper chest: Negative. Other: None. IMPRESSION: 1. No cervical spine fracture. 2. Loss of cervical lordosis. This can be related to patient positioning, muscle spasm or soft tissue injury. Electronically Signed   By: Kennith CenterEric  Mansell M.D.   On: 02/14/2018 21:53    Procedures Procedures (including critical care time)  Medications Ordered in ED Medications  ibuprofen (ADVIL,MOTRIN) tablet 400 mg (400 mg Oral Given 02/14/18 2130)     Initial Impression / Assessment and Plan / ED Course  I have reviewed the triage vital signs and the nursing notes.  Pertinent labs & imaging results that were available during my care of the patient were reviewed by me and considered in my medical decision making (see chart for details).     Patient is a 52 year old female who presents after a fall.  She has pain to her neck, back, right shoulder and both ankles.  She has some superficial abrasions but nothing that appears to need suturing.  Her tetanus shot is up-to-date.  There is no bony injuries noted on imaging studies.  She is neurologically intact.  She was given ibuprofen and is feeling a little bit better.  She denies need for any further pain medication.  She was placed in an ASO Velcro splint to her right ankle in an Ace wrap to her left ankle.  She was then encouraged in ice and elevation.  She was encouraged to follow-up with her PCP for recheck.  Return precautions were given.  Final Clinical Impressions(s) / ED Diagnoses   Final diagnoses:  Fall, initial encounter  Sprain of left ankle, unspecified  ligament, initial encounter  Sprain of right ankle, unspecified ligament, initial encounter  Lumbar back sprain, initial encounter  Neck sprain, initial encounter    ED Discharge Orders    None       Rolan BuccoBelfi, Eagle Pitta, MD 02/14/18 2342

## 2018-02-14 NOTE — ED Notes (Addendum)
Alert, NAD, calm, interactive, resps e/u, speaking in clear complete sentences, no dyspnea noted, HOB 45 degrees, c-collar in place, "feel a little better in the shoulders, the same in the ankle". [facility] Updated.

## 2018-02-14 NOTE — ED Notes (Signed)
ED Provider at bedside. 

## 2018-02-14 NOTE — ED Triage Notes (Signed)
Presents post fall from 6 steps up outside landed in a bush on the ground on her right shoulder and neck, right ankle swelling and pain, multiple abraisions. Unsure if she lost consciousness but family does not think she did. TO[birth year] is her birthday. She is alert and oriented. c-collar applied.

## 2018-02-15 ENCOUNTER — Other Ambulatory Visit: Payer: Self-pay

## 2018-02-15 DIAGNOSIS — S93402A Sprain of unspecified ligament of left ankle, initial encounter: Secondary | ICD-10-CM | POA: Diagnosis not present

## 2018-02-17 ENCOUNTER — Ambulatory Visit (INDEPENDENT_AMBULATORY_CARE_PROVIDER_SITE_OTHER): Payer: BC Managed Care – PPO | Admitting: Orthopaedic Surgery

## 2018-02-17 ENCOUNTER — Telehealth (INDEPENDENT_AMBULATORY_CARE_PROVIDER_SITE_OTHER): Payer: Self-pay

## 2018-02-17 NOTE — Telephone Encounter (Signed)
Patient approved for SynviscOne injection,left knee. Reference# [id number] Valid 02/12/2018-02/12/2019 Appt. 02/24/2018

## 2018-02-24 ENCOUNTER — Encounter (INDEPENDENT_AMBULATORY_CARE_PROVIDER_SITE_OTHER): Payer: Self-pay | Admitting: Orthopaedic Surgery

## 2018-02-24 ENCOUNTER — Ambulatory Visit (INDEPENDENT_AMBULATORY_CARE_PROVIDER_SITE_OTHER): Payer: Self-pay

## 2018-02-24 ENCOUNTER — Ambulatory Visit (INDEPENDENT_AMBULATORY_CARE_PROVIDER_SITE_OTHER): Payer: BC Managed Care – PPO | Admitting: Orthopaedic Surgery

## 2018-02-24 VITALS — Ht 68.0 in | Wt 228.0 lb

## 2018-02-24 DIAGNOSIS — M1712 Unilateral primary osteoarthritis, left knee: Secondary | ICD-10-CM | POA: Diagnosis not present

## 2018-02-24 DIAGNOSIS — M25571 Pain in right ankle and joints of right foot: Secondary | ICD-10-CM | POA: Diagnosis not present

## 2018-02-24 DIAGNOSIS — M79671 Pain in right foot: Secondary | ICD-10-CM | POA: Insufficient documentation

## 2018-02-24 MED ORDER — HYLAN G-F 20 48 MG/6ML IX SOSY
48.0000 mg | PREFILLED_SYRINGE | INTRA_ARTICULAR | Status: AC | PRN
  Administered 2018-02-24: 48 mg via INTRA_ARTICULAR

## 2018-02-24 NOTE — Progress Notes (Signed)
Office Visit Note   Patient: Michaela Hanson           Date of Birth: [birth year]           MRN: [id number] Visit Date: 02/24/2018              Requested by: Gwenlyn Found, MD [id number] [place], [place] [address] PCP: Gwenlyn Found, MD   Assessment & Plan: Visit Diagnoses:  1. Pain in right ankle and joints of right foot   2. Pain in right foot   3. 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  I will put her in a cam walking boot and have her be careful for this ankle for the next 3-4 weeks.  She also tolerated the Synvisc 1 injection well in her left knee.  I like to see her back in 3 weeks for repeat exam of her left knee as well as her right foot and ankle.  All questions and concerns were answered and addressed.  Follow-Up Instructions: Return in about 3 weeks (around 03/17/2018).   Orders:  Orders Placed This Encounter  Procedures  . Large Joint Inj  . Large Joint Inj  . XR Ankle Complete Right  . XR Foot 2 Views Right   No orders of the defined types were placed in this encounter.     Procedures: Large Joint Inj: L knee on 02/24/2018 5:33 PM Indications: pain and diagnostic evaluation Details: 22 G 1.5 in needle, superolateral approach  Arthrogram: No  Medications: 48 mg Hylan 48 MG/6ML Outcome: tolerated well, no immediate complications Procedure, treatment alternatives, risks and benefits explained, specific risks discussed. Consent was given by the patient. Immediately prior to procedure a time out was called to verify the correct patient, procedure, equipment, support staff and site/side marked as required. Patient was prepped and draped in the usual sterile fashion.       Clinical Data: No additional findings.   Subjective: Chief Complaint  Patient presents with  . Left Knee - Pain   Synvisc One [employer] Bill Lot #[id number] Exp date 7//31/2021  . Right Ankle - Pain, Edema    S/p fall 02/14/18  . Right Foot - Pain, Edema    S/p fall 02/14/18  The patient is originally here for a scheduled Synvisc 1 injection of hyaluronic acid in her left knee due to moderate arthritic findings and pain.  She is tried conservative treatment with a steroid.  She has known arthritic findings on plain films.  She is had arthroscopic intervention as well.  This was in 2017 in terms of the knee surgery.  However she has had a recent fall injuring her right foot and right ankle.  She did this evaluated today.  She has severe midfoot pain as well as global ankle pain on the right side.  This is been after mechanical fall recently.  She denies any numbness and tingling in her foot.  HPI  Review of Systems She denies any fever, chills, nausea, vomiting  Objective: Vital Signs: Ht 5\' 8"  (1.727 m)   Wt 228 lb (103.4 kg)   BMI 34.67 kg/m   Physical Exam She is alert and oriented x3 and in no acute distress Ortho Exam Examination of her right ankle does show global swelling and tenderness.  There is tenderness medially and laterally.  She also has midfoot pain and an obvious flatfoot deformity.  There is pain along the course of the posterior tibial tendon as well.  She has a palpable pulse in her foot and normal sensation.  There is no gross deformities other than soft tissue swelling. Specialty Comments:  No specialty comments available.  Imaging: Xr Ankle Complete Right  Result Date: 02/24/2018 3 views of the right ankle shows soft tissue swelling but no gross deformities and acute findings in terms of fracture.  She does have a significant flatfoot deformity.  The ankle is well located with a congruent ankle mortise.  There is slight arthritic changes at the tip of the medial malleolus and fibula suggesting old sprains.  Xr Foot 2 Views Right  Result Date: 02/24/2018  2 views of the right foot  show a normal-appearing Lisfranc joint and no acute findings other than a flatfoot deformity.  There is no fracture seen.    PMFS History: Patient Active Problem List   Diagnosis Date Noted  . Pain in right ankle and joints of right foot 02/24/2018  . Pain in right foot 02/24/2018  . Unilateral primary osteoarthritis, left knee 02/24/2018  . Essential hypertension 07/22/2017  . Melasma 04/23/2016  . Left ACL tear 03/07/2016  . Dysplasia of cervix, low grade (CIN 1) 09/21/2015  . H/O LEEP 09/21/2015  . 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 Generalized anxiety disorder 05/07/2015  . Migraine without status migrainosus, not intractable 05/07/2015  . Back injury 11/23/2013    Class: Acute   Past Medical History:  Diagnosis Date  . Abnormal Pap smear 8/13   8/13 ASCUS +HPV, 8/14 ASCUS HPV not detected, 06-26-14 ASCUS +HPV HR  . Anemia    WITH PREGNANCY  . Anxiety   . Arthritis    LEFT KNEE  . Hypertension   . Insomnia   . Migraine headache    with aura (visual changes)    Family History  Problem Relation Age of Onset  . Hypertension Mother   . Hypertension Father   . Breast cancer Sister [id number]  . Breast cancer Paternal Aunt 2363       died of pancreatic cancer age 50    Past Surgical History:  Procedure Laterality Date  . APPENDECTOMY  2005  . ARTHROSCOPY WITH ANTERIOR CRUCIATE LIGAMENT (ACL) REPAIR WITH ANTERIOR TIBILIAS GRAFT  03/07/2016    Dr. Thamas JaegersLennon  . BREAST CYST EXCISION Left 6/13  . 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 DILATATION & CURETTAGE/HYSTEROSCOPY WITH MYOSURE N/A 09/15/2017   Procedure: DILATATION & CURETTAGE/HYSTEROSCOPY,WITH ROLLERBALL ABLATION;  Surgeon: Jerene BearsMiller, Mary S, MD;  [facility];  Service: Gynecology;  Laterality: N/A;  . TUBAL LIGATION  1998   BTL   Social History   Occupational History  . Not on file  Tobacco Use  . Smoking status: Never Smoker  . Smokeless tobacco: Never Used  Substance and Sexual  Activity  . Alcohol use: No  . Drug use: No  . Sexual activity: Yes    Partners: Male    Birth control/protection: Surgical    Comment: BTL

## 2018-03-17 ENCOUNTER — Ambulatory Visit (INDEPENDENT_AMBULATORY_CARE_PROVIDER_SITE_OTHER): Payer: BC Managed Care – PPO

## 2018-03-17 ENCOUNTER — Ambulatory Visit (INDEPENDENT_AMBULATORY_CARE_PROVIDER_SITE_OTHER): Payer: BC Managed Care – PPO | Admitting: Orthopaedic Surgery

## 2018-03-17 ENCOUNTER — Other Ambulatory Visit: Payer: BC Managed Care – PPO

## 2018-03-17 ENCOUNTER — Encounter (INDEPENDENT_AMBULATORY_CARE_PROVIDER_SITE_OTHER): Payer: Self-pay | Admitting: Orthopaedic Surgery

## 2018-03-17 DIAGNOSIS — M25571 Pain in right ankle and joints of right foot: Secondary | ICD-10-CM

## 2018-03-17 NOTE — Progress Notes (Signed)
Office Visit Note   Patient: Michaela Hanson           Date of Birth: June 03, 1966           MRN: [id number] Visit Date: 03/17/2018              Requested by: Gwenlyn Found, MD [address] [place], [place] [address] PCP: Gwenlyn Found, MD   Assessment & Plan: Visit Diagnoses:  1. Pain in right ankle and joints of right foot     Plan: She will transition out of the cam walker boot to a regular shoe as tolerated.  We will see her back if her pain persist or becomes worse in regards to the right ankle and foot.  Or if she develops any mechanical symptoms ankle which is reviewed with her today.  In regards to her knee she understands that she can have supplemental injection no more often than every 6 months.  [name] returns today for follow-up status post left knee  Follow-Up Instructions: Return if symptoms worsen or fail to improve.   Orders:  Orders Placed This Encounter  Procedures  . XR Foot Complete Right   No orders of the defined types were placed in this encounter.     Procedures: No procedures performed   Clinical Data: No additional findings.   Subjective: Chief Complaint  Patient presents with  . Left Knee - Follow-up    S/p hyaluronic acid injection 02/24/18  . Right Foot - Follow-up    DOI 02/14/18  . Right Ankle - Follow-up    DOI 02/14/18    HPI Supplemental injection 02/24/2018.  States knee overall is feeling better.  She does have an area of discoloration from the ethyl chloride she is concerned about.  Regards to her right foot and ankle pain status post fall on 02/14/2018.  She states she is wearing a cam walker boot still has some soreness of the foot and the posterior aspect of the ankle.  She notes she is starting to have some back pain shoulder pain from being in the boot. Review of Systems See HPI otherwise negative  Objective: Vital Signs: There were no vitals taken for this visit.  Physical Exam  Constitutional: She is  oriented to person, place, and time. She appears well-developed and well-nourished.  Pulmonary/Chest: Effort normal.  Neurological: She is alert and oriented to person, place, and time.  Skin: She is not diaphoretic.  Psychiatric: She has a normal mood and affect.    Ortho Exam Right foot ankle she has some swelling dorsal aspect of the right foot.  Good range of motion of the right ankle without pain.  Non-tender over the Achilles.  She has tenderness right foot only over the second third metatarsal shafts.  No rashes skin lesions ulcerations. Left knee good range of motion without pain.  She has area of depigmentation secondary to ethyl chloride. Specialty Comments:  No specialty comments available.  Imaging: Xr Foot Complete Right  Result Date: 03/17/2018 3 views right foot: No acute fracture.  No Lisfranc injuries.  Sesamoids well located.  No bony abnormalities.    PMFS History: Patient Active Problem List   Diagnosis Date Noted  . Pain in right ankle and joints of right foot 02/24/2018  . Pain in right foot 02/24/2018  . Unilateral primary osteoarthritis, left knee 02/24/2018  . Essential hypertension 07/22/2017  . Melasma 04/23/2016  . Left ACL tear 03/07/2016  . Dysplasia of cervix, low  grade (CIN 1) 09/21/2015  . H/O LEEP 09/21/2015  . 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 Generalized anxiety disorder 05/07/2015  . Migraine without status migrainosus, not intractable 05/07/2015  . Back injury 11/23/2013    Class: Acute   Past Medical History:  Diagnosis Date  . Abnormal Pap smear 8/13   8/13 ASCUS +HPV, 8/14 ASCUS HPV not detected, 06-26-14 ASCUS +HPV HR  . Anemia    WITH PREGNANCY  . Anxiety   . Arthritis    LEFT KNEE  . Hypertension   . Insomnia   . Migraine headache    with aura (visual changes)    Family History  Problem Relation Age of Onset  . Hypertension Mother   . Hypertension Father   . Breast cancer Sister 42  . Breast cancer  Paternal Aunt 6       died of pancreatic cancer age 49    Past Surgical History:  Procedure Laterality Date  . APPENDECTOMY  2005  . ARTHROSCOPY WITH ANTERIOR CRUCIATE LIGAMENT (ACL) REPAIR WITH ANTERIOR TIBILIAS GRAFT  03/07/2016    Dr. Thamas Jaegers  . BREAST CYST EXCISION Left 6/13  . 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 DILATATION & CURETTAGE/HYSTEROSCOPY WITH MYOSURE N/A 09/15/2017   Procedure: DILATATION & CURETTAGE/HYSTEROSCOPY,WITH ROLLERBALL ABLATION;  Surgeon: Jerene Bears, MD;  Location: [facility];  Service: Gynecology;  Laterality: N/A;  . TUBAL LIGATION  1998   BTL   Social History   Occupational History  . Not on file  Tobacco Use  . Smoking status: Never Smoker  . Smokeless tobacco: Never Used  Substance and Sexual Activity  . Alcohol use: No  . Drug use: No  . Sexual activity: Yes    Partners: Male    Birth control/protection: Surgical    Comment: BTL

## 2018-03-18 ENCOUNTER — Other Ambulatory Visit: Payer: BC Managed Care – PPO

## 2018-03-25 ENCOUNTER — Other Ambulatory Visit (INDEPENDENT_AMBULATORY_CARE_PROVIDER_SITE_OTHER): Payer: BC Managed Care – PPO

## 2018-03-25 DIAGNOSIS — E559 Vitamin D deficiency, unspecified: Secondary | ICD-10-CM

## 2018-03-26 ENCOUNTER — Other Ambulatory Visit: Payer: Self-pay

## 2018-03-26 ENCOUNTER — Other Ambulatory Visit: Payer: Self-pay | Admitting: Certified Nurse Midwife

## 2018-03-26 DIAGNOSIS — E559 Vitamin D deficiency, unspecified: Secondary | ICD-10-CM

## 2018-03-26 LAB — VITAMIN D 25 HYDROXY (VIT D DEFICIENCY, FRACTURES): Vit D, 25-Hydroxy: 15.1 ng/mL — ABNORMAL LOW (ref 30.0–100.0)

## 2018-03-26 MED ORDER — VITAMIN D (ERGOCALCIFEROL) 1.25 MG (50000 UNIT) PO CAPS: 50000.0000 [IU] | ORAL_CAPSULE | ORAL | 0 refills | Status: DC

## 2018-04-08 ENCOUNTER — Ambulatory Visit (INDEPENDENT_AMBULATORY_CARE_PROVIDER_SITE_OTHER): Payer: BC Managed Care – PPO | Admitting: Physician Assistant

## 2018-04-15 ENCOUNTER — Encounter (INDEPENDENT_AMBULATORY_CARE_PROVIDER_SITE_OTHER): Payer: Self-pay | Admitting: Physician Assistant

## 2018-04-15 ENCOUNTER — Ambulatory Visit (INDEPENDENT_AMBULATORY_CARE_PROVIDER_SITE_OTHER): Payer: BC Managed Care – PPO | Admitting: Physician Assistant

## 2018-04-15 ENCOUNTER — Ambulatory Visit (INDEPENDENT_AMBULATORY_CARE_PROVIDER_SITE_OTHER): Payer: BC Managed Care – PPO

## 2018-04-15 DIAGNOSIS — M25561 Pain in right knee: Secondary | ICD-10-CM | POA: Diagnosis not present

## 2018-04-15 MED ORDER — LIDOCAINE HCL 1 % IJ SOLN
3.0000 mL | INTRAMUSCULAR | Status: AC | PRN
  Administered 2018-04-15: 3 mL

## 2018-04-15 MED ORDER — METHYLPREDNISOLONE ACETATE 40 MG/ML IJ SUSP
40.0000 mg | INTRAMUSCULAR | Status: AC | PRN
  Administered 2018-04-15: 40 mg via INTRA_ARTICULAR

## 2018-04-15 MED ORDER — MELOXICAM 7.5 MG PO TABS: 7.5000 mg | ORAL_TABLET | Freq: Two times a day (BID) | ORAL | 2 refills | Status: DC

## 2018-04-15 NOTE — Progress Notes (Signed)
Office Visit Note   Patient: Michaela Hanson           Date of Birth: 03/07/1966           MRN: [id number] Visit Date: 04/15/2018              Requested by: Michaela Hanson, Samantha A, MD [address] [place], [place] [id number] PCP: Michaela Hanson, Samantha A, MD   Assessment & Plan: Visit Diagnoses:  1. Right knee pain, unspecified chronicity     Plan: Discussed with her at length knee friendly exercises.  Also showed her quad strengthening exercises.  Place her on Mobic 7.5 mg 1 tab daily up to twice daily with food.  No other NSAIDs while on Mobic.  Have her follow-up on an as-needed basis pain persist or becomes worse.  Questions encouraged and answered at length.  Follow-Up Instructions: No follow-ups on file.   Orders:  Orders Placed This Encounter  Procedures  . Large Joint Inj  . XR Knee 1-2 Views Right   Meds ordered this encounter  Medications  . meloxicam (MOBIC) 7.5 MG tablet    Sig: Take 1 tablet (7.5 mg total) by mouth 2 (two) times daily.    Dispense:  90 tablet    Refill:  2      Procedures: Large Joint Inj: R knee on 04/15/2018 4:57 PM Indications: pain Details: 22 G 1.5 in needle, anterolateral approach  Arthrogram: No  Medications: 3 mL lidocaine 1 %; 40 mg methylPREDNISolone acetate 40 MG/ML Outcome: tolerated well, no immediate complications Procedure, treatment alternatives, risks and benefits explained, specific risks discussed. Consent was given by the patient. Immediately prior to procedure Hanson time out was called to verify the correct patient, procedure, equipment, support staff and site/side marked as required. Patient was prepped and draped in the usual sterile fashion.       Clinical Data: No additional findings.   Subjective: Chief Complaint  Patient presents with  . Right Knee - Pain    HPI Michaela Hanson comes in today due to right knee pain.  Said no new injury.  She has pain with going up and down stairs also with flexing the knee.  She  denies any swelling she does have some painful popping and giving way but no falls.  She was last given Hanson Synvisc 1 injection in March 2019 and [place] by another provider.  She is done well until lately. She has been taking some Motrin which is not helping with the pain.  Her left knee which she received Hanson cortisone injection in March which continues to help.  Review of Systems See HPI  Objective: Vital Signs: There were no vitals taken for this visit.  Physical Exam  Constitutional: She is oriented to person, place, and time. She appears well-developed and well-nourished. No distress.  Cardiovascular: Intact distal pulses.  Neurological: She is alert and oriented to person, place, and time.  Skin: She is not diaphoretic.  Psychiatric: She has Hanson normal mood and affect.    Ortho Exam Bilateral knees no abnormal warmth erythema or effusion.  No instability valgus varus stressing bilateral knees.  Right knee significant patellofemoral crepitus with passive range of motion.  McMurray's is negative.  Tenderness along the medial joint line. Specialty Comments:  No specialty comments available.  Imaging: Xr Knee 1-2 Views Right  Result Date: 04/15/2018 Right knee AP lateral views: Medial joint line moderate narrowing.  Patellofemoral changes mild to moderate.  No acute fractures bony  ab is otherwise.    PMFS History: Patient Active Problem List   Diagnosis Date Noted  . Pain in right ankle and joints of right foot 02/24/2018  . Pain in right foot 02/24/2018  . Unilateral primary osteoarthritis, left knee 02/24/2018  . Essential hypertension 07/22/2017  . Melasma 04/23/2016  . Left ACL tear 03/07/2016  . Dysplasia of cervix, low grade (CIN 1) 09/21/2015  . H/O LEEP 09/21/2015  . 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 Generalized anxiety disorder 05/07/2015  . Migraine without status migrainosus, not intractable 05/07/2015  . Back injury 11/23/2013    Class: Acute    Past Medical History:  Diagnosis Date  . Abnormal Pap smear 8/13   8/13 ASCUS +HPV, 8/14 ASCUS HPV not detected, 06-26-14 ASCUS +HPV HR  . Anemia    WITH PREGNANCY  . Anxiety   . Arthritis    LEFT KNEE  . Hypertension   . Insomnia   . Migraine headache    with aura (visual changes)    Family History  Problem Relation Age of Onset  . Hypertension Mother   . Hypertension Father   . Breast cancer Sister 40  . Breast cancer Paternal Aunt 71       died of pancreatic cancer age 67    Past Surgical History:  Procedure Laterality Date  . APPENDECTOMY  2005  . ARTHROSCOPY WITH ANTERIOR CRUCIATE LIGAMENT (ACL) REPAIR WITH ANTERIOR TIBILIAS GRAFT  03/07/2016    Dr. Thamas Jaegers  . BREAST CYST EXCISION Left 6/13  . 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 DILATATION & CURETTAGE/HYSTEROSCOPY WITH MYOSURE N/Hanson 09/15/2017   Procedure: DILATATION & CURETTAGE/HYSTEROSCOPY,WITH ROLLERBALL ABLATION;  Surgeon: Jerene Bears, MD;  Location: [facility];  Service: Gynecology;  Laterality: N/Hanson;  . TUBAL LIGATION  1998   BTL   Social History   Occupational History  . Not on file  Tobacco Use  . Smoking status: Never Smoker  . Smokeless tobacco: Never Used  Substance and Sexual Activity  . Alcohol use: No  . Drug use: No  . Sexual activity: Yes    Partners: Male    Birth control/protection: Surgical    Comment: BTL

## 2018-06-15 ENCOUNTER — Other Ambulatory Visit (INDEPENDENT_AMBULATORY_CARE_PROVIDER_SITE_OTHER): Payer: BC Managed Care – PPO

## 2018-06-15 DIAGNOSIS — E559 Vitamin D deficiency, unspecified: Secondary | ICD-10-CM

## 2018-06-16 ENCOUNTER — Other Ambulatory Visit: Payer: Self-pay

## 2018-06-16 ENCOUNTER — Other Ambulatory Visit: Payer: BC Managed Care – PPO

## 2018-06-16 ENCOUNTER — Other Ambulatory Visit: Payer: Self-pay | Admitting: Certified Nurse Midwife

## 2018-06-16 DIAGNOSIS — E559 Vitamin D deficiency, unspecified: Secondary | ICD-10-CM

## 2018-06-16 LAB — VITAMIN D 25 HYDROXY (VIT D DEFICIENCY, FRACTURES): Vit D, 25-Hydroxy: 21.3 ng/mL — ABNORMAL LOW (ref 30.0–100.0)

## 2018-06-16 MED ORDER — VITAMIN D (ERGOCALCIFEROL) 1.25 MG (50000 UNIT) PO CAPS: 50000.0000 [IU] | ORAL_CAPSULE | ORAL | 1 refills | Status: DC

## 2018-06-16 NOTE — Telephone Encounter (Signed)
lmtcb

## 2018-07-08 ENCOUNTER — Ambulatory Visit (INDEPENDENT_AMBULATORY_CARE_PROVIDER_SITE_OTHER): Payer: BC Managed Care – PPO | Admitting: Obstetrics & Gynecology

## 2018-07-08 VITALS — BP 108/64 | HR 74 | Resp 14 | Ht 68.0 in | Wt 238.0 lb

## 2018-07-08 DIAGNOSIS — E559 Vitamin D deficiency, unspecified: Secondary | ICD-10-CM | POA: Diagnosis not present

## 2018-07-08 DIAGNOSIS — R5383 Other fatigue: Secondary | ICD-10-CM

## 2018-07-08 DIAGNOSIS — E079 Disorder of thyroid, unspecified: Secondary | ICD-10-CM

## 2018-07-08 DIAGNOSIS — N898 Other specified noninflammatory disorders of vagina: Secondary | ICD-10-CM

## 2018-07-08 DIAGNOSIS — N912 Amenorrhea, unspecified: Secondary | ICD-10-CM

## 2018-07-08 MED ORDER — TERCONAZOLE 0.4 % VA CREA: TOPICAL_CREAM | VAGINAL | 0 refills | Status: DC

## 2018-07-08 MED ORDER — FLUCONAZOLE 150 MG PO TABS: 150.0000 mg | ORAL_TABLET | Freq: Once | ORAL | 0 refills | Status: AC

## 2018-07-08 NOTE — Progress Notes (Signed)
GYNECOLOGY  VISIT  CC:   Vulvar irritation, fatigue, weight concerns  HPI: 52 y.o. [id number] Married [place] female here for several issues.  1)  Col[facility] was [facility] two [facility].  She things she got a fissure from the colonoscopy being removed.  Diltiazem cream has been prescribed.  Now she having itching with bumps that are present perirectally.  Having associated vaginal itching.  Not having rectal bleeding or vaginal bleeding.  2) Reports a month ago, she started having increased fatigue and increased jitters.  Can get to sleep but wakes up to to three times a night.  Is taking longer to get to sleep than normal.  Having some palpitations.  Reports mild SOB with walking.    3)  She is frustrated with weight but this has been an ongoing issue.    She is currently at [employer].  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  Can already do early retirement.    PCP:  Real ConsMorgan Wright, PA.  GYNECOLOGIC HISTORY: No LMP recorded. Patient has had an ablation. Contraception: BTL Menopausal hormone therapy: estrace vag cream  Patient Active Problem List   Diagnosis Date Noted  . Pain in right ankle and joints of right foot 02/24/2018  . Pain in right foot 02/24/2018  . Unilateral primary osteoarthritis, left knee 02/24/2018  . Essential hypertension 07/22/2017  . Melasma 04/23/2016  . Left ACL tear 03/07/2016  . Dysplasia of cervix, low grade (CIN 1) 09/21/2015  . H/O LEEP 09/21/2015  . 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 Generalized anxiety disorder 05/07/2015  . Migraine without status migrainosus, not intractable 05/07/2015  . Back injury 11/23/2013    Class: Acute    Past Medical History:  Diagnosis Date  . Abnormal Pap smear 8/13   8/13 ASCUS +HPV, 8/14 ASCUS HPV not detected, 06-26-14 ASCUS +HPV HR  . Anemia    WITH  PREGNANCY  . Anxiety   . Arthritis    LEFT KNEE  . Hypertension   . Insomnia   . Migraine headache    with aura (visual changes)    Past Surgical History:  Procedure Laterality Date  . APPENDECTOMY  2005  . ARTHROSCOPY WITH ANTERIOR CRUCIATE LIGAMENT (ACL) REPAIR WITH ANTERIOR TIBILIAS GRAFT  03/07/2016    Dr. Thamas JaegersLennon  . BREAST CYST EXCISION Left 6/13  . 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 DILATATION & CURETTAGE/HYSTEROSCOPY WITH MYOSURE N/A 09/15/2017   Procedure: DILATATION & CURETTAGE/HYSTEROSCOPY,WITH ROLLERBALL ABLATION;  Surgeon: Jerene BearsMiller, Jilliane Kazanjian S, MD;  [facility];  Service: Gynecology;  Laterality: N/A;  . TUBAL LIGATION  1998   BTL    MEDS:   Current Outpatient Medications on File Prior to Visit  Medication Sig Dispense Refill  . clobetasol ointment (TEMOVATE) 0.05 % Apply topically 2 (two) times daily. Do not use for more than 7 days in a row. 30 g 1  . clobetasol ointment (TEMOVATE) 0.05 % Apply to affected areas daily for one week, then 4 times per weeks. Not to face.    . escitalopram (LEXAPRO) 10 MG tablet Take 15 mg by mouth.    . meloxicam (MOBIC) 7.5 MG tablet Take 1 tablet (7.5 mg total) by mouth 2 (two) times daily. 90 tablet 2  . Multiple Vitamins-Minerals (MULTIVITAMIN PO) Take by mouth daily.    .Marland Kitchen  triamterene-hydrochlorothiazide (DYAZIDE) 37.5-25 MG capsule Take by mouth. Takes 1/2 daily    . triamterene-hydrochlorothiazide (MAXZIDE-25) 37.5-25 MG tablet Take by mouth.    . Vitamin D, Ergocalciferol, (DRISDOL) 50000 units CAPS capsule Take 1 capsule (50,000 Units total) by mouth every 7 (seven) days. 12 capsule 1  . Butalbital-Aspirin-Caffeine (BUTALBITAL-ASA-CAFFEINE PO) Take by mouth as needed.    Marland Kitchen escitalopram (LEXAPRO) 20 MG tablet Take by mouth.    . hydroquinone 4 % cream Apply to affected areas on face nightly for 3 months, then go to every other days for 3 months     No current facility-administered medications on file prior  to visit.     ALLERGIES: Flagyl [metronidazole]  Family History  Problem Relation Age of Onset  . Hypertension Mother   . Hypertension Father   . Breast cancer Sister 41  . Breast cancer Paternal Aunt 29       died of pancreatic cancer age 33    SH:  Married, non smoker  Review of Systems  Cardiovascular: Positive for palpitations.       Palpitations   Gastrointestinal:       Bloating     PHYSICAL EXAMINATION:    BP 108/64 (BP Location: Right Arm, Patient Position: Sitting, Cuff Size: Normal)   Pulse 74   Resp 14   Ht 5\' 8"  (1.727 m)   Wt 238 lb (108 kg)   BMI 36.19 kg/m     General appearance: alert, cooperative and appears stated age Lymph:  no inguinal LAD noted  Pelvic: External genitalia:  no lesions              Urethra:  normal appearing urethra with no masses, tenderness or lesions              Bartholins and Skenes: normal      Vagina:  No lesions but whitish discharge present   Cervix:  No lesions   Uterus:  Non tender and mobile, no masses              Anus:  Perianal erythema but no discrete lesions  Chaperone was present for exam.  Assessment: Fatigue Anxiety with work Perianal irritation with recent anal fissure after colonoscopy Amenorrhea after endometrial ablation  Plan: Vit D, TSH and thyroid panel FSH and estradiol CMP and CBC  Vaginitis testing obtained today Diflucan 150mg  po x 1, repeat 72 hours.  Terazol 7 cream externally twice daily.  Results will be called to pt.   ~25 minutes spent with patient >50% of time was in face to face discussion of above.

## 2018-07-09 ENCOUNTER — Encounter: Payer: Self-pay | Admitting: Obstetrics & Gynecology

## 2018-07-09 LAB — COMPREHENSIVE METABOLIC PANEL
ALT: 15 IU/L (ref 0–32)
AST: 25 IU/L (ref 0–40)
Albumin/Globulin Ratio: 1.3 (ref 1.2–2.2)
Albumin: 4.2 g/dL (ref 3.5–5.5)
Alkaline Phosphatase: 62 IU/L (ref 39–117)
BUN/Creatinine Ratio: 14 (ref 9–23)
BUN: 10 mg/dL (ref 6–24)
Bilirubin Total: 0.5 mg/dL (ref 0.0–1.2)
CO2: 27 mmol/L (ref 20–29)
Calcium: 9.4 mg/dL (ref 8.7–10.2)
Chloride: 101 mmol/L (ref 96–106)
Creatinine, Ser: 0.71 mg/dL (ref 0.57–1.00)
GFR calc Af Amer: 115 mL/min/{1.73_m2} (ref >59)
GFR calc non Af Amer: 100 mL/min/{1.73_m2} (ref >59)
Globulin, Total: 3.3 g/dL (ref 1.5–4.5)
Glucose: 84 mg/dL (ref 65–99)
Potassium: 3.9 mmol/L (ref 3.5–5.2)
Sodium: 143 mmol/L (ref 134–144)
Total Protein: 7.5 g/dL (ref 6.0–8.5)

## 2018-07-09 LAB — THYROID PANEL WITH TSH
Free Thyroxine Index: 1.7 (ref 1.2–4.9)
T3 Uptake Ratio: 21 % — ABNORMAL LOW (ref 24–39)
T4, Total: 8.2 ug/dL (ref 4.5–12.0)
TSH: 1.34 u[IU]/mL (ref 0.450–4.500)

## 2018-07-09 LAB — CBC
Hematocrit: 32.8 % — ABNORMAL LOW (ref 34.0–46.6)
Hemoglobin: 10.8 g/dL — ABNORMAL LOW (ref 11.1–15.9)
MCH: 29.3 pg (ref 26.6–33.0)
MCHC: 32.9 g/dL (ref 31.5–35.7)
MCV: 89 fL (ref 79–97)
Platelets: 209 10*3/uL (ref 150–450)
RBC: 3.68 x10E6/uL — ABNORMAL LOW (ref 3.77–5.28)
RDW: 13.6 % (ref 12.3–15.4)
WBC: 3.3 10*3/uL — ABNORMAL LOW (ref 3.4–10.8)

## 2018-07-09 LAB — VAGINITIS/VAGINOSIS, DNA PROBE
Candida Species: NEGATIVE
Gardnerella vaginalis: NEGATIVE
Trichomonas vaginosis: NEGATIVE

## 2018-07-09 LAB — FOLLICLE STIMULATING HORMONE: FSH: 72.8 m[IU]/mL

## 2018-07-09 LAB — ESTRADIOL: Estradiol: 7.4 pg/mL

## 2018-07-09 LAB — VITAMIN D 25 HYDROXY (VIT D DEFICIENCY, FRACTURES): Vit D, 25-Hydroxy: 32.2 ng/mL (ref 30.0–100.0)

## 2018-07-13 ENCOUNTER — Telehealth: Payer: Self-pay | Admitting: Obstetrics & Gynecology

## 2018-07-13 DIAGNOSIS — D649 Anemia, unspecified: Secondary | ICD-10-CM

## 2018-07-13 NOTE — Telephone Encounter (Signed)
Patient called to see if her recent lab results are ready yet. She was last seen on 07/08/18.

## 2018-07-13 NOTE — Telephone Encounter (Signed)
Dr. Hyacinth Meeker -please review labs dated 07/08/18 and advise.

## 2018-07-14 NOTE — Telephone Encounter (Signed)
Spoke with patient, patient calling to request labs. Advised patient Dr. Hyacinth Meeker does need to review labs and make recommendations. Dr. Hyacinth Meeker is out of the office today, will forward for her to review, our office will return call once completed.   Pateint asked about vaginitis testing, advised negative for yeast, trich and BV.   Dr. Hyacinth Meeker -please review and advise.

## 2018-07-14 NOTE — Telephone Encounter (Signed)
Results note sent to triage.  Thanks.

## 2018-07-15 ENCOUNTER — Telehealth: Payer: Self-pay | Admitting: Obstetrics & Gynecology

## 2018-07-15 NOTE — Telephone Encounter (Signed)
Spoke with patient. Patient states she forgot to provide update to Dr. Hyacinth Meeker. Seen by PCP on 9/2 for itching that she was having all over her body. She received a cortisone shot. Patient states she is still itching all over her body. Patient denies any GYN symptoms. Recommended patient f/u with PCP for itching, will update Dr. Hyacinth Meeker. I will return call if any additional recommendations, patient verbalizes understanding.   Routing to provider for final review. Patient is agreeable to disposition. Will close encounter.

## 2018-07-15 NOTE — Telephone Encounter (Signed)
-----   Message from Jerene Bears, MD sent at 07/14/2018  2:39 PM EDT ----- Please let pt know her vaginitis testing was negative.  Thyroid testing was fine.  Her hemoglobin is 10.8 (anemia) which is surprising as she had an ablation last year and has not had any vaginal bleeding in months.  I think she needs to return for iron testing and also needs a GI referral to evaluated for any GI bleeding.  Orders for iron studies have been placed.  She also needs to start a vitamin with iron or OTC iron and take it with OJ to increase the iron absorption.  CMP and Vit D were normal as well.    Lastly, her hormone levels show she is in menopause.  We could consider starting HRT for her as well.  If she if ok with this, I would start 0.5mg  estradiol and 100mg  prometrium daily.  Would recheck 1 month after starting.

## 2018-07-15 NOTE — Telephone Encounter (Signed)
Spoke with patient. Advised as seen below per Dr. Hyacinth Meeker.   1. Patient agreeable to referral to GI, prefers female. Advised will place referral to Dr. Loreta Ave, our office referral coordinator will f/u with scheduling. Patient states she did have colonoscopy at [facility] on 8/7 or 8/8, unsure of exact date, does not see GI.   2. Patient request to return to discuss HRT prior to starting medication, no RX sent. Patient request first or last appt of the day. Will have labs drawn at this OV. OV scheduled for 9/9 at 4:15 pm with Dr. Hyacinth Meeker.   Order placed for referral to GI.   Routing to provider for final review. Patient is agreeable to disposition. Will close encounter.  Cc: Soundra Pilon

## 2018-07-15 NOTE — Telephone Encounter (Signed)
Patient is asking to talk with Michaela Hanson again after there conversation this morning.

## 2018-07-19 ENCOUNTER — Ambulatory Visit (INDEPENDENT_AMBULATORY_CARE_PROVIDER_SITE_OTHER): Payer: BC Managed Care – PPO | Admitting: Obstetrics & Gynecology

## 2018-07-19 VITALS — BP 110/74 | HR 68 | Resp 16 | Ht 68.0 in | Wt 240.0 lb

## 2018-07-19 DIAGNOSIS — D649 Anemia, unspecified: Secondary | ICD-10-CM

## 2018-07-19 DIAGNOSIS — Z79890 Hormone replacement therapy (postmenopausal): Secondary | ICD-10-CM

## 2018-07-19 MED ORDER — HYDROXYZINE PAMOATE 25 MG PO CAPS: 25.0000 mg | ORAL_CAPSULE | Freq: Three times a day (TID) | ORAL | 0 refills | Status: DC | PRN

## 2018-07-19 MED ORDER — ESTRADIOL-NORETHINDRONE ACET 0.5-0.1 MG PO TABS: 1.0000 | ORAL_TABLET | Freq: Every day | ORAL | 1 refills | Status: DC

## 2018-07-19 NOTE — Progress Notes (Signed)
GYNECOLOGY  VISIT  CC:   Discuss HRT  HPI: 52 y.o. G22P2002 Married [place] female here for consult for hormone therapy.  Reports she just isn't feeling well.  Recent blood work showed anemia which was surprising considering she hasn't had any bleeding in almost a year.  She did recently have a negative colonoscopy.  Is going to have iron studies done today.  Also noted was elevated FSh and low estradiol.  Offered HRT and she wanted to discuss it.  Here for this today.  Discussed with patient risks and benefits and specifically the WHI study including but not limited to risks of increased risks of heart disease, MI, stroke, DVT, and breast cancer.  Increased risks of gall bladder disease and change in cholesterol panels also discussed.  Possibility of bleeding was discussed as patient does have a uterus.  Benefits of improved quality of life, improved bone density and decreased risks of colon cancer also discussed.    Continues to have perirectal itching that started having finishing her prep for her colonoscopy.  Has received Dexamethasone injection, has used topical steroid, Difcluan, or pecid, topical vaseline and preparation H.  Recently saw urgent care.  Note reviewed.  She did start the oral pepcid but never started the antihistamine.  Not sure this is at her pharmacy.  rx done for her today.  She does have a dermatologist:  Jettie Pagan.  [facility] in [place].  Can't been seen until October.    GYNECOLOGIC HISTORY: No LMP recorded. Patient has had an ablation. Contraception: None Menopausal hormonne therapy: none   Patient Active Problem List   Diagnosis Date Noted  . Pain in right ankle and joints of right foot 02/24/2018  . Pain in right foot 02/24/2018  . Unilateral primary osteoarthritis, left knee 02/24/2018  . Essential hypertension 07/22/2017  . Melasma 04/23/2016  . Left ACL tear 03/07/2016  . Dysplasia of cervix, low grade (CIN 1)  09/21/2015  . H/O LEEP 09/21/2015  . 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 Generalized anxiety disorder 05/07/2015  . Migraine without status migrainosus, not intractable 05/07/2015  . Back injury 11/23/2013    Class: Acute    Past Medical History:  Diagnosis Date  . Abnormal Pap smear 8/13   8/13 ASCUS +HPV, 8/14 ASCUS HPV not detected, 06-26-14 ASCUS +HPV HR  . Anemia    WITH PREGNANCY  . Anxiety   . Arthritis    LEFT KNEE  . Hypertension   . Insomnia   . Migraine headache    with aura (visual changes)    Past Surgical History:  Procedure Laterality Date  . APPENDECTOMY  2005  . ARTHROSCOPY WITH ANTERIOR CRUCIATE LIGAMENT (ACL) REPAIR WITH ANTERIOR TIBILIAS GRAFT  03/07/2016    Dr. Thamas Jaegers  . BREAST CYST EXCISION Left 6/13  . 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 DILATATION & CURETTAGE/HYSTEROSCOPY WITH MYOSURE N/A 09/15/2017   Procedure: DILATATION & CURETTAGE/HYSTEROSCOPY,WITH ROLLERBALL ABLATION;  Surgeon: Jerene Bears, MD;  Location: [facility];  Service: Gynecology;  Laterality: N/A;  . TUBAL LIGATION  1998   BTL    MEDS:   Current Outpatient Medications on File Prior to Visit  Medication Sig Dispense Refill  . Butalbital-Aspirin-Caffeine (BUTALBITAL-ASA-CAFFEINE PO) Take by mouth as needed.    . clobetasol ointment (TEMOVATE) 0.05 % Apply topically 2 (two) times daily. Do not use for more than 7 days in a row. 30 g 1  . clobetasol  ointment (TEMOVATE) 0.05 % Apply to affected areas daily for one week, then 4 times per weeks. Not to face.    . escitalopram (LEXAPRO) 10 MG tablet Take 15 mg by mouth.    . escitalopram (LEXAPRO) 20 MG tablet Take by mouth.    . hydroquinone 4 % cream Apply to affected areas on face nightly for 3 months, then go to every other days for 3 months    . meloxicam (MOBIC) 7.5 MG tablet Take 1 tablet (7.5 mg total) by mouth 2 (two) times daily. 90 tablet 2  . Multiple Vitamins-Minerals (MULTIVITAMIN PO) Take  by mouth daily.    Marland Kitchen terconazole (TERAZOL 7) 0.4 % vaginal cream Apply externally twice daily 45 g 0  . triamterene-hydrochlorothiazide (DYAZIDE) 37.5-25 MG capsule Take by mouth. Takes 1/2 daily    . triamterene-hydrochlorothiazide (MAXZIDE-25) 37.5-25 MG tablet Take by mouth.    . Vitamin D, Ergocalciferol, (DRISDOL) 50000 units CAPS capsule Take 1 capsule (50,000 Units total) by mouth every 7 (seven) days. 12 capsule 1   No current facility-administered medications on file prior to visit.     ALLERGIES: Flagyl [metronidazole]  Family History  Problem Relation Age of Onset  . Hypertension Mother   . Hypertension Father   . Breast cancer Sister 15  . Breast cancer Paternal Aunt 19       died of pancreatic cancer age 45    SH:  Married, non smoker  Review of Systems  Skin: Positive for rash.  Psychiatric/Behavioral:       Anxiety     PHYSICAL EXAMINATION:   Vitals:   07/19/18 1647  BP: 110/74  Pulse: 68  Resp: 16      General appearance: alert, cooperative and appears stated age No exam performed today  Assessment: Anemia with negative recent colonoscopy (need to get copy of this) Fatigue Menopausal Continued perirectal itching  Plan: Will start Activella HS 1 tab daily. Iron studies obtained today.   Will see if can help getting her appt with dermatology.   ~15 minutes spent with patient >50% of time was in face to face discussion of above.

## 2018-07-20 ENCOUNTER — Telehealth: Payer: Self-pay | Admitting: Obstetrics & Gynecology

## 2018-07-20 DIAGNOSIS — D649 Anemia, unspecified: Secondary | ICD-10-CM

## 2018-07-20 NOTE — Telephone Encounter (Signed)
Patient is asking if Dr.Miller would recommend a "natural hormone therapy"?

## 2018-07-20 NOTE — Telephone Encounter (Signed)
Routing to Dr. Hyacinth Meeker to review and advise.  Appointment yesterday to discuss HRT. Started on Generic Activella.  She picked up Activella but is hesitant to start. Wants to know if Dr. Hyacinth Meeker would recommend any natural or herbal remedies.  Advised will send message to provider for recommendation and return call. Pt agrees.

## 2018-07-21 ENCOUNTER — Encounter: Payer: Self-pay | Admitting: Obstetrics & Gynecology

## 2018-07-21 LAB — IRON,TIBC AND FERRITIN PANEL
Ferritin: 29 ng/mL (ref 15–150)
Iron Saturation: 21 % (ref 15–55)
Iron: 80 ug/dL (ref 27–159)
Total Iron Binding Capacity: 388 ug/dL (ref 250–450)
UIBC: 308 ug/dL (ref 131–425)

## 2018-07-21 NOTE — Telephone Encounter (Signed)
Spoke with patient. She states she already takes an oral iron tablet as well as a daily multivitamin. Do you have any further recommendations for Iron?   Message from Dr. Hyacinth Meeker discussed. She is going to try OTC options first instead of HRT and will see how she feels, if not improved will start HRT.   Contacted dermatology office and they stated patient did not have appointment. Spoke with patient and she states she did not schedule because they are scheduling so far out. Advised patient to call if she still needs appointment and ask to be placed on a waiting list. She states she is feeling better with visitaril so she will call derm if she still needs an appointment.

## 2018-07-21 NOTE — Telephone Encounter (Signed)
1)  It is fine not to start HRT.  Can try Black cohosh OTC twice daily or Estroven (the regular one) daily.  2)  Iron studies showed ferritin to be a little low and iron saturation to be a little low.  She should take OTC iron or a MVI with iron daily and have CBC with diff rechecked in one month.  3)  Could you please also call her dermatologist (in my most recent note) to see if they can see her sooner.  Having perirectal itching that started after her colonoscopy prep.  Has tried a whole host of things that haven't helped.

## 2018-07-22 ENCOUNTER — Other Ambulatory Visit: Payer: Self-pay | Admitting: Obstetrics & Gynecology

## 2018-07-22 DIAGNOSIS — D649 Anemia, unspecified: Secondary | ICD-10-CM

## 2018-07-22 NOTE — Progress Notes (Signed)
Additional lab orders placed

## 2018-07-23 NOTE — Telephone Encounter (Signed)
I am sorry, I did not realize she was taking the dosage of iron she was taking.  This does not need to be adjusted.  Given that fact, she should have several other blood tests done--LDH, liver panel, and reticulocyte count done.  If she wants to do these at an outside [facility], I can place the orders.  Depending on when she is doing them, I could repeat the CBC at the same time as well.  Thanks.

## 2018-07-26 ENCOUNTER — Other Ambulatory Visit: Payer: Self-pay | Admitting: Obstetrics & Gynecology

## 2018-07-26 NOTE — Telephone Encounter (Signed)
Dr. Hyacinth MeekerMiller,  Can you place future lab orders. Michaela Hanson is coming in on 08/18/18 for lab work.   Also requests refill of Estrace cream for vaginal dryness. Has used this in the past and requests refill.

## 2018-07-27 ENCOUNTER — Other Ambulatory Visit: Payer: Self-pay | Admitting: Obstetrics & Gynecology

## 2018-07-27 DIAGNOSIS — Z860100 Personal history of colon polyps, unspecified: Secondary | ICD-10-CM | POA: Insufficient documentation

## 2018-07-27 NOTE — Telephone Encounter (Signed)
Order have been placed for future labs.  Ok to RF estrace vaginal cream 1 gm pv twice weekly.  #42.5gm tube/2RF.

## 2018-07-28 MED ORDER — ESTRADIOL 0.1 MG/GM VA CREA: TOPICAL_CREAM | VAGINAL | 2 refills | Status: DC

## 2018-07-29 NOTE — Telephone Encounter (Signed)
Order placed.   Encounter closed.  

## 2018-08-18 ENCOUNTER — Other Ambulatory Visit (INDEPENDENT_AMBULATORY_CARE_PROVIDER_SITE_OTHER): Payer: BC Managed Care – PPO

## 2018-08-18 DIAGNOSIS — D649 Anemia, unspecified: Secondary | ICD-10-CM

## 2018-08-19 LAB — CBC WITH DIFFERENTIAL/PLATELET
Basophils Absolute: 0 10*3/uL (ref 0.0–0.2)
Basos: 1 %
EOS (ABSOLUTE): 0 10*3/uL (ref 0.0–0.4)
Eos: 1 %
Hematocrit: 36 % (ref 34.0–46.6)
Hemoglobin: 12.1 g/dL (ref 11.1–15.9)
Immature Grans (Abs): 0 10*3/uL (ref 0.0–0.1)
Immature Granulocytes: 0 %
Lymphocytes Absolute: 1.6 10*3/uL (ref 0.7–3.1)
Lymphs: 47 %
MCH: 30.5 pg (ref 26.6–33.0)
MCHC: 33.6 g/dL (ref 31.5–35.7)
MCV: 91 fL (ref 79–97)
Monocytes Absolute: 0.3 10*3/uL (ref 0.1–0.9)
Monocytes: 9 %
Neutrophils Absolute: 1.4 10*3/uL (ref 1.4–7.0)
Neutrophils: 42 %
Platelets: 265 10*3/uL (ref 150–450)
RBC: 3.97 x10E6/uL (ref 3.77–5.28)
RDW: 14.8 % (ref 12.3–15.4)
WBC: 3.4 10*3/uL (ref 3.4–10.8)

## 2018-08-19 LAB — HEPATIC FUNCTION PANEL
ALT: 12 IU/L (ref 0–32)
AST: 19 IU/L (ref 0–40)
Albumin: 4 g/dL (ref 3.5–5.5)
Alkaline Phosphatase: 67 IU/L (ref 39–117)
Bilirubin Total: 0.4 mg/dL (ref 0.0–1.2)
Bilirubin, Direct: 0.15 mg/dL (ref 0.00–0.40)
Total Protein: 7.2 g/dL (ref 6.0–8.5)

## 2018-08-19 LAB — RETICULOCYTES: Retic Ct Pct: 1.1 % (ref 0.6–2.6)

## 2018-08-19 LAB — LACTATE DEHYDROGENASE: LDH: 188 IU/L (ref 119–226)

## 2018-08-25 ENCOUNTER — Ambulatory Visit (INDEPENDENT_AMBULATORY_CARE_PROVIDER_SITE_OTHER): Payer: BC Managed Care – PPO

## 2018-08-25 ENCOUNTER — Encounter (INDEPENDENT_AMBULATORY_CARE_PROVIDER_SITE_OTHER): Payer: Self-pay | Admitting: Physician Assistant

## 2018-08-25 ENCOUNTER — Ambulatory Visit (INDEPENDENT_AMBULATORY_CARE_PROVIDER_SITE_OTHER): Payer: BC Managed Care – PPO | Admitting: Physician Assistant

## 2018-08-25 VITALS — Ht 68.0 in | Wt 230.0 lb

## 2018-08-25 DIAGNOSIS — M545 Low back pain, unspecified: Secondary | ICD-10-CM

## 2018-08-25 MED ORDER — METHYLPREDNISOLONE 4 MG PO TBPK: ORAL_TABLET | ORAL | 0 refills | Status: DC

## 2018-08-25 MED ORDER — CYCLOBENZAPRINE HCL 10 MG PO TABS: 10.0000 mg | ORAL_TABLET | Freq: Every day | ORAL | 0 refills | Status: DC

## 2018-08-25 NOTE — Progress Notes (Signed)
Office Visit Note   Patient: Michaela Hanson           Date of Birth: 01-Dec-1965           MRN: [id number] Visit Date: 08/25/2018              Requested by: Gwenlyn Found, MD [id number] [place], [place] [address] PCP: Gwenlyn Found, MD   Assessment & Plan: Visit Diagnoses:  1. Acute midline low back pain, unspecified whether sciatica present     Plan: She is placed on a Medrol Dosepak no NSAIDs while on the Medrol Dosepak and then she is to go on either meloxicam or ibuprofen but not both that she has been taking both.  Also replaced her on Flexeril 10 mg 1 p.o. nightly.  Consider physical therapy for back exercises home exercise program stretching modalities.  Moist heat to low back.  Follow-up with [place] in a month check progress.  Sooner if her condition worsens.  Questions were encouraged and answered.  Follow-Up Instructions: Return in about 4 weeks (around 09/22/2018).   Orders:  Orders Placed This Encounter  Procedures  . XR Lumbar Spine 2-3 Views  . Ambulatory referral to Physical Therapy   Meds ordered this encounter  Medications  . methylPREDNISolone (MEDROL) 4 MG TBPK tablet    Sig: Take as directed    Dispense:  21 tablet    Refill:  0  . cyclobenzaprine (FLEXERIL) 10 MG tablet    Sig: Take 1 tablet (10 mg total) by mouth at bedtime.    Dispense:  30 tablet    Refill:  0      Procedures: No procedures performed   Clinical Data: No additional findings.   Subjective: Chief Complaint  Patient presents with  . Lower Back - Pain    HPI Michaela Hanson comes in today due to low back pain for the past 2 to 3 weeks.  States her pain is worse with standing.  She states she has some soreness in the low back to touch.  She denies any radicular symptoms down either leg.  Denies any bowel or bladder dysfunction denies any saddle anesthesia like symptoms.  Denies waking pain.  Denies urinary frequency or urgency.  Denies any fevers or chills.  No injury to  low back.  She is been taking meloxicam and ibuprofen states the ibuprofen helps minimally. Review of Systems Please see HPI otherwise negative  Objective: Vital Signs: Ht 5\' 8"  (1.727 m)   Wt 230 lb (104.3 kg)   BMI 34.97 kg/m   Physical Exam General: Well-developed well-nourished female in no acute distress.  Mood and affect appropriate. Psych alert and oriented x3.\ Vascular: Calves are supple nontender bilaterally dorsal pedal pulses are intact bilaterally Ortho Exam Lumbar spine she has tenderness in the lower lumbar spine region and paraspinous region on the right and left.  She comes within an inch of being able to touch her toes with flexion of lumbar spine limited extension and some discomfort.  Negative straight leg raise bilaterally.  5 out of 5 strength throughout the lower extremities against resistance.  She is able to walk on her heels and tiptoes.  Deep tendon reflexes are 2+ at the knees and 1+ at the ankles and equal and symmetric.  Sensation grossly intact bilateral feet light touch. Specialty Comments:  No specialty comments available.  Imaging: Xr Lumbar Spine 2-3 Views  Result Date: 08/25/2018 Lumbar spine 2 views: No acute fracture.  No spondylolisthesis.  Loss of disc space at L1-2 and L2-3.  Loss of lordotic curvature.  Facet changes mid lumbar region.    PMFS History: Patient Active Problem List   Diagnosis Date Noted  . Pain in right ankle and joints of right foot 02/24/2018  . Pain in right foot 02/24/2018  . Unilateral primary osteoarthritis, left knee 02/24/2018  . Essential hypertension 07/22/2017  . Melasma 04/23/2016  . Left ACL tear 03/07/2016  . Dysplasia of cervix, low grade (CIN 1) 09/21/2015  . H/O LEEP 09/21/2015  . 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 Generalized anxiety disorder 05/07/2015  . Migraine without status migrainosus, not intractable 05/07/2015  . Back injury 11/23/2013    Class: Acute   Past Medical  History:  Diagnosis Date  . Abnormal Pap smear 8/13   8/13 ASCUS +HPV, 8/14 ASCUS HPV not detected, 06-26-14 ASCUS +HPV HR  . Anemia    WITH PREGNANCY  . Anxiety   . Arthritis    LEFT KNEE  . Hypertension   . Insomnia   . Migraine headache    with aura (visual changes)    Family History  Problem Relation Age of Onset  . Hypertension Mother   . Hypertension Father   . Breast cancer Sister 9  . Breast cancer Paternal Aunt 10       died of pancreatic cancer age 62    Past Surgical History:  Procedure Laterality Date  . APPENDECTOMY  2005  . ARTHROSCOPY WITH ANTERIOR CRUCIATE LIGAMENT (ACL) REPAIR WITH ANTERIOR TIBILIAS GRAFT  03/07/2016    Dr. Thamas Jaegers  . BREAST CYST EXCISION Left 6/13  . 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 DILATATION & CURETTAGE/HYSTEROSCOPY WITH MYOSURE N/A 09/15/2017   Procedure: DILATATION & CURETTAGE/HYSTEROSCOPY,WITH ROLLERBALL ABLATION;  Surgeon: Jerene Bears, MD;  Location: [facility];  Service: Gynecology;  Laterality: N/A;  . TUBAL LIGATION  1998   BTL   Social History   Occupational History  . Not on file  Tobacco Use  . Smoking status: Never Smoker  . Smokeless tobacco: Never Used  Substance and Sexual Activity  . Alcohol use: No  . Drug use: No  . Sexual activity: Yes    Partners: Male    Birth control/protection: Surgical    Comment: BTL

## 2018-09-20 ENCOUNTER — Other Ambulatory Visit (INDEPENDENT_AMBULATORY_CARE_PROVIDER_SITE_OTHER): Payer: BC Managed Care – PPO

## 2018-09-20 DIAGNOSIS — E559 Vitamin D deficiency, unspecified: Secondary | ICD-10-CM

## 2018-09-21 LAB — VITAMIN D 25 HYDROXY (VIT D DEFICIENCY, FRACTURES): Vit D, 25-Hydroxy: 31.3 ng/mL (ref 30.0–100.0)

## 2018-09-22 ENCOUNTER — Other Ambulatory Visit: Payer: Self-pay

## 2018-09-22 ENCOUNTER — Ambulatory Visit (INDEPENDENT_AMBULATORY_CARE_PROVIDER_SITE_OTHER): Payer: BC Managed Care – PPO | Admitting: Orthopaedic Surgery

## 2018-09-22 DIAGNOSIS — E559 Vitamin D deficiency, unspecified: Secondary | ICD-10-CM

## 2018-09-22 MED ORDER — VITAMIN D (ERGOCALCIFEROL) 1.25 MG (50000 UNIT) PO CAPS: 50000.0000 [IU] | ORAL_CAPSULE | ORAL | 1 refills | Status: DC

## 2018-10-26 NOTE — Progress Notes (Signed)
52 y.o. 752P2002 Married Other or two or more races female here for annual exam.  Denies vaginal bleeding.  Decided to take early retirement.  Last day was 10/29/18.    PCP:  PA, Real ConsMorgan Wright.  Reviewed blood work in Care everywhere.   No LMP recorded. Patient has had an ablation.          Sexually active: Yes.    The current method of family planning is Ablation .    Exercising: No.  The patient does not participate in regular exercise at present. Smoker:  no  Health Maintenance: Pap:  10/16/17 Negative, neg HR HPV  09/08/16 negative, HR HPV negative             04/08/16 negative,             08/27/15 negative, HR HPV + History of abnormal Pap:  yes MMG:  06-18-18 Density A / Bi-rads 1 Neg ( care everywhere) Colonoscopy: 06/17/18, adenomatous polyp.  Repeat due in 5 years. BMD:   NA TDaP:  05/30/15 Pneumonia vaccine(s):  NA Shingrix:   N/A Hep C testing: never Screening Labs: none indicated today   reports that she has never smoked. She has never used smokeless tobacco. She reports that she does not drink alcohol or use drugs.  Past Medical History:  Diagnosis Date  . Abnormal Pap smear 8/13   8/13 ASCUS +HPV, 8/14 ASCUS HPV not detected, 06-26-14 ASCUS +HPV HR  . Anemia    WITH PREGNANCY  . Anxiety   . Arthritis    LEFT KNEE  . Hypertension   . Insomnia   . Migraine headache    with aura (visual changes)    Past Surgical History:  Procedure Laterality Date  . APPENDECTOMY  2005  . ARTHROSCOPY WITH ANTERIOR CRUCIATE LIGAMENT (ACL) REPAIR WITH ANTERIOR TIBILIAS GRAFT  03/07/2016    Dr. Thamas JaegersLennon  . BREAST CYST EXCISION Left 6/13  . 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 DILATATION & CURETTAGE/HYSTEROSCOPY WITH MYOSURE N/A 09/15/2017   Procedure: DILATATION & CURETTAGE/HYSTEROSCOPY,WITH ROLLERBALL ABLATION;  Surgeon: Jerene BearsMiller, Aden Sek S, MD;  [facility];  Service: Gynecology;  Laterality: N/A;  . TUBAL LIGATION  1998   BTL    Current Outpatient  Medications  Medication Sig Dispense Refill  . clobetasol ointment (TEMOVATE) 0.05 % Apply topically 2 (two) times daily. Do not use for more than 7 days in a row. 30 g 1  . escitalopram (LEXAPRO) 10 MG tablet Take 10 mg by mouth daily.    Marland Kitchen. escitalopram (LEXAPRO) 5 MG tablet Take 5 mg by mouth daily.    Marland Kitchen. estradiol (ESTRACE) 0.1 MG/GM vaginal cream 1 gram vaginally twice weekly 42.5 g 2  . ferrous sulfate 325 (65 FE) MG tablet Take 325 mg by mouth daily with breakfast.    . hydroquinone 4 % cream Apply to affected areas on face nightly for 3 months, then go to every other days for 3 months    . meloxicam (MOBIC) 7.5 MG tablet Take 1 tablet (7.5 mg total) by mouth 2 (two) times daily. 90 tablet 2  . Multiple Vitamins-Minerals (MULTIVITAMIN PO) Take by mouth daily.    Marland Kitchen. triamterene-hydrochlorothiazide (DYAZIDE) 37.5-25 MG capsule Take by mouth. Takes 1/2 daily    . Vitamin D, Ergocalciferol, (DRISDOL) 1.25 MG (50000 UT) CAPS capsule Take 1 capsule (50,000 Units total) by mouth every 7 (seven) days. 12 capsule 1  . Vitamin D, Ergocalciferol, (DRISDOL) 50000 units CAPS  capsule Take 1 capsule (50,000 Units total) by mouth every 7 (seven) days. 12 capsule 1   No current facility-administered medications for this visit.     Family History  Problem Relation Age of Onset  . Hypertension Mother   . Hypertension Father   . Breast cancer Sister 25  . Breast cancer Paternal Aunt 55       died of pancreatic cancer age 33    Review of Systems  Genitourinary: Positive for vaginal discharge.    Exam:   BP 110/64   Pulse 64   Resp 14   Ht 5\' 8"  (1.727 m)   Wt 234 lb (106.1 kg)   BMI 35.58 kg/m    Height: 5\' 8"  (172.7 cm)  Ht Readings from Last 3 Encounters:  11/05/18 5\' 8"  (1.727 m)  08/25/18 5\' 8"  (1.727 m)  07/19/18 5\' 8"  (1.727 m)    General appearance: alert, cooperative and appears stated age Head: Normocephalic, without obvious abnormality, atraumatic Neck: no adenopathy, supple,  symmetrical, trachea midline and thyroid normal to inspection and palpation Lungs: clear to auscultation bilaterally Breasts: normal appearance, no masses or tenderness Heart: regular rate and rhythm Abdomen: soft, non-tender; bowel sounds normal; no masses,  no organomegaly Extremities: extremities normal, atraumatic, no cyanosis or edema Skin: Skin color, texture, turgor normal. No rashes or lesions Lymph nodes: Cervical, supraclavicular, and axillary nodes normal. No abnormal inguinal nodes palpated Neurologic: Grossly normal   Pelvic: External genitalia:  no lesions              Urethra:  normal appearing urethra with no masses, tenderness or lesions              Bartholins and Skenes: normal                 Vagina: normal appearing vagina with normal color and discharge, no lesions              Cervix: no lesions              Pap taken: Yes.   Bimanual Exam:  Uterus:  normal size, contour, position, consistency, mobility, non-tender              Adnexa: normal adnexa and no mass, fullness, tenderness               Rectovaginal: Confirms               Anus:  normal sphincter tone, no lesions  Chaperone was present for exam.  A:  Well Woman with normal exam Hypertension Anxiety H/o abnormal pap smear with +HR HPV.  2017 neg pap and 2018 pap neg with HR HPV H/o menorrhagia with irregular cycles, s/p roller ball ablation Vaginal discharge  P:   Mammogram guidelines reviewed pap smear and HR HPV obtained today Needs colonoscopy in 2024 Lab work and vaccines UTD.  Declines shingrix vaccination today. RF for estradiol cream were done 9/19 Recheck Vit D in 6 months Affirm pending return annually or prn

## 2018-11-05 ENCOUNTER — Encounter: Payer: Self-pay | Admitting: Obstetrics & Gynecology

## 2018-11-05 ENCOUNTER — Ambulatory Visit (INDEPENDENT_AMBULATORY_CARE_PROVIDER_SITE_OTHER): Payer: BC Managed Care – PPO | Admitting: Obstetrics & Gynecology

## 2018-11-05 ENCOUNTER — Other Ambulatory Visit (HOSPITAL_COMMUNITY)
Admission: RE | Admit: 2018-11-05 | Discharge: 2018-11-05 | Disposition: A | Discharge status: Home / Self Care | Payer: BC Managed Care – PPO | Source: Ambulatory Visit | Attending: Obstetrics & Gynecology | Admitting: Obstetrics & Gynecology

## 2018-11-05 VITALS — BP 110/64 | HR 64 | Resp 14 | Ht 68.0 in | Wt 234.0 lb

## 2018-11-05 DIAGNOSIS — B977 Papillomavirus as the cause of diseases classified elsewhere: Secondary | ICD-10-CM | POA: Diagnosis present

## 2018-11-05 DIAGNOSIS — Z01419 Encounter for gynecological examination (general) (routine) without abnormal findings: Secondary | ICD-10-CM

## 2018-11-05 DIAGNOSIS — Z124 Encounter for screening for malignant neoplasm of cervix: Secondary | ICD-10-CM | POA: Insufficient documentation

## 2018-11-05 DIAGNOSIS — N898 Other specified noninflammatory disorders of vagina: Secondary | ICD-10-CM

## 2018-11-06 LAB — VAGINITIS/VAGINOSIS, DNA PROBE
Candida Species: NEGATIVE
Gardnerella vaginalis: POSITIVE — AB
Trichomonas vaginosis: NEGATIVE

## 2018-11-08 ENCOUNTER — Other Ambulatory Visit: Payer: Self-pay

## 2018-11-08 ENCOUNTER — Other Ambulatory Visit: Payer: Self-pay | Admitting: Obstetrics & Gynecology

## 2018-11-08 DIAGNOSIS — N760 Acute vaginitis: Secondary | ICD-10-CM

## 2018-11-08 DIAGNOSIS — B9689 Other specified bacterial agents as the cause of diseases classified elsewhere: Secondary | ICD-10-CM

## 2018-11-08 MED ORDER — METRONIDAZOLE 0.75 % VA GEL: 1.0000 | Freq: Every day | VAGINAL | 0 refills | Status: DC

## 2018-11-08 MED ORDER — TINIDAZOLE 500 MG PO TABS: 1000.0000 mg | ORAL_TABLET | Freq: Once | ORAL | 0 refills | Status: AC

## 2018-11-08 NOTE — Telephone Encounter (Signed)
Called patient informed her of positive BV results. Patient chose to use  tinidazole 1gm po x 5 days.    Tinisazole is flagging as allergy. Please advise. Since she is allergic to flagyl.

## 2018-11-08 NOTE — Telephone Encounter (Signed)
Please let pt know this is in the same class but I've had other pts with reactions to Metronidazole who could tolerate tinidazole.  Review her reaction to the metronidazole and as long as it was not anaphylaxis, I think ok to take but give precautions if has reaction that she needs to stop, let me know, and have benadryl on hand if there is any rash or swelling.  Thanks.

## 2018-11-09 LAB — CYTOLOGY - PAP
Diagnosis: NEGATIVE
HPV: NOT DETECTED

## 2018-12-27 ENCOUNTER — Telehealth: Payer: Self-pay | Admitting: Obstetrics & Gynecology

## 2018-12-27 NOTE — Telephone Encounter (Signed)
Spoke with patient, Reports brown vaginal d/c and vagina "feeling off", symptoms started today. Denies vaginal odor, discomfort, itching, pain, N/V, fever/chills, urinary symptoms. No new products or STD concerns. Hx of ablation. Tx for BV with metrogel x5 night 10/2018, symptoms resolved. Recommended OV for further evaluation, OV scheduled for 2/18 at 2:45pm with Dr. Hyacinth Meeker.   Routing to provider for final review. Patient is agreeable to disposition. Will close encounter.

## 2018-12-27 NOTE — Telephone Encounter (Signed)
Patient is having BV symptoms again. Says she was recently treated for this.

## 2018-12-28 ENCOUNTER — Ambulatory Visit (INDEPENDENT_AMBULATORY_CARE_PROVIDER_SITE_OTHER): Payer: BC Managed Care – PPO | Admitting: Obstetrics & Gynecology

## 2018-12-28 ENCOUNTER — Encounter: Payer: Self-pay | Admitting: Obstetrics & Gynecology

## 2018-12-28 VITALS — BP 110/78 | HR 84 | Resp 16 | Ht 68.0 in | Wt 236.8 lb

## 2018-12-28 DIAGNOSIS — N898 Other specified noninflammatory disorders of vagina: Secondary | ICD-10-CM

## 2018-12-28 NOTE — Progress Notes (Signed)
GYNECOLOGY  VISIT  CC:   Vaginal discharge   HPI: 53 y.o. G83P2002 Married Other or two or more races female here for vaginal discharge that has been present about 24 hours.  Discharge looked orange with wiping yesterday.  Discharge did not have any odor yesterday.  Denies urinary symptoms.  Denies pelvic pain.  Not having itching.  Denies hot flashes.  Sleep is not great.  Wakes up about every three or four hours.    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  Looking for part time job at this point.  Would like reception work.    GYNECOLOGIC HISTORY: Patient's last menstrual period was 08/10/2017 (approximate). Contraception: PMP Menopausal hormone therapy: none  Patient Active Problem List   Diagnosis Date Noted  . Pain in right ankle and joints of right foot 02/24/2018  . Pain in right foot 02/24/2018  . Unilateral primary osteoarthritis, left knee 02/24/2018  . Essential hypertension 07/22/2017  . Melasma 04/23/2016  . Left ACL tear 03/07/2016  . Dysplasia of cervix, low grade (CIN 1) 09/21/2015  . H/O LEEP 09/21/2015  . 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 Generalized anxiety disorder 05/07/2015  . Migraine without status migrainosus, not intractable 05/07/2015  . Back injury 11/23/2013    Class: Acute    Past Medical History:  Diagnosis Date  . Abnormal Pap smear 8/13   8/13 ASCUS +HPV, 8/14 ASCUS HPV not detected, 06-26-14 ASCUS +HPV HR  . Anemia    WITH PREGNANCY  . Anxiety   . Arthritis    LEFT KNEE  . Hypertension   . Insomnia   . Migraine headache    with aura (visual changes)    Past Surgical History:  Procedure Laterality Date  . APPENDECTOMY  2005  . ARTHROSCOPY WITH ANTERIOR CRUCIATE LIGAMENT (ACL) REPAIR WITH ANTERIOR TIBILIAS GRAFT  03/07/2016    Dr. Thamas Jaegers  . BREAST CYST EXCISION Left 6/13  . 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 DILATATION & CURETTAGE/HYSTEROSCOPY WITH MYOSURE N/A 09/15/2017   Procedure: DILATATION & CURETTAGE/HYSTEROSCOPY,WITH ROLLERBALL ABLATION;  Surgeon: Jerene Bears, MD;  Location: [facility];  Service: Gynecology;  Laterality: N/A;  . TUBAL LIGATION  1998   BTL    MEDS:   Current Outpatient Medications on File Prior to Visit  Medication Sig Dispense Refill  . clobetasol ointment (TEMOVATE) 0.05 % Apply topically 2 (two) times daily. Do not use for more than 7 days in a row. 30 g 1  . escitalopram (LEXAPRO) 10 MG tablet Take 10 mg by mouth daily.    Marland Kitchen escitalopram (LEXAPRO) 5 MG tablet Take 5 mg by mouth daily.    Marland Kitchen estradiol (ESTRACE) 0.1 MG/GM vaginal cream 1 gram vaginally twice weekly 42.5 g 2  . ferrous sulfate 325 (65 FE) MG tablet Take 325 mg by mouth daily with breakfast.    . hydrochlorothiazide (HYDRODIURIL) 25 MG tablet Take 1 tablet by mouth daily.    . hydroquinone 4 % cream Apply to affected areas on face nightly for 3 months, then go to every other days for 3 months    . meloxicam (MOBIC) 7.5 MG tablet Take 1 tablet (7.5 mg total) by mouth 2 (two) times daily. 90 tablet 2  . Multiple Vitamins-Minerals (MULTIVITAMIN PO) Take by mouth daily.    . Vitamin D, Ergocalciferol, (DRISDOL) 1.25 MG (50000 UT) CAPS capsule Take 1 capsule (50,000 Units total) by mouth every 7 (seven) days.  12 capsule 1   No current facility-administered medications on file prior to visit.     ALLERGIES: Flagyl [metronidazole]  Family History  Problem Relation Age of Onset  . Hypertension Mother   . Hypertension Father   . Breast cancer Sister 88  . Breast cancer Paternal Aunt 10       died of pancreatic cancer age 57    SH:  Married, non smoker  Review of Systems  Constitutional:       Weight gain   Gastrointestinal: Positive for abdominal distention.  Genitourinary: Positive for vaginal discharge.  All other systems reviewed and are negative.   PHYSICAL EXAMINATION:    BP 110/78 (BP Location: Right Arm, Patient Position: Sitting, Cuff  Size: Large)   Pulse 84   Resp 16   Ht 5\' 8"  (1.727 m)   Wt 236 lb 12.8 oz (107.4 kg)   LMP 08/10/2017 (Approximate)   BMI 36.01 kg/m     General appearance: alert, cooperative and appears stated age Abdomen: soft, non-tender; bowel sounds normal; no masses,  no organomegaly Lymph:  no inguinal LAD noted  Pelvic: External genitalia:  no lesions              Urethra:  normal appearing urethra with no masses, tenderness or lesions              Bartholins and Skenes: normal                 Vagina: no lesions, mucosa normal, vaginal discharge noted              Cervix: no lesions              Bimanual Exam:  Uterus:  normal size, contour, position, consistency, mobility, non-tender              Adnexa: no mass, fullness, tenderness  Chaperone was present for exam.  Assessment: Vaginal discharge, possible vaginitis Treated for BV about a month ago  Plan: Vaginitis swab obtained today.  If BV present, will have her return for repeat testing to ensure resolves.  If still present, will treat for recurrent/persistnet BV.

## 2018-12-29 ENCOUNTER — Other Ambulatory Visit: Payer: Self-pay | Admitting: *Deleted

## 2018-12-29 LAB — VAGINITIS/VAGINOSIS, DNA PROBE
Candida Species: NEGATIVE
Gardnerella vaginalis: POSITIVE — AB
Trichomonas vaginosis: NEGATIVE

## 2018-12-29 MED ORDER — METRONIDAZOLE 0.75 % VA GEL: 1.0000 | Freq: Every day | VAGINAL | 0 refills | Status: DC

## 2018-12-30 ENCOUNTER — Encounter

## 2018-12-30 ENCOUNTER — Ambulatory Visit: Payer: BC Managed Care – PPO | Admitting: Obstetrics & Gynecology

## 2019-01-18 ENCOUNTER — Other Ambulatory Visit: Payer: Self-pay

## 2019-01-18 ENCOUNTER — Ambulatory Visit (INDEPENDENT_AMBULATORY_CARE_PROVIDER_SITE_OTHER): Payer: BC Managed Care – PPO | Admitting: Obstetrics & Gynecology

## 2019-01-18 ENCOUNTER — Encounter: Payer: Self-pay | Admitting: Obstetrics & Gynecology

## 2019-01-18 VITALS — BP 106/70 | HR 80 | Resp 16 | Ht 68.0 in | Wt 238.6 lb

## 2019-01-18 DIAGNOSIS — N898 Other specified noninflammatory disorders of vagina: Secondary | ICD-10-CM | POA: Diagnosis not present

## 2019-01-18 NOTE — Progress Notes (Signed)
GYNECOLOGY  VISIT  CC:   Vaginitis f/u, weight gain.   HPI: 53 y.o. G21P2002 Married Other or two or more races female here for vaginitis check.  Has been diagnosed with BV x 2 in the past few months.  Testing done 12/19 and 12/2018.  She is not sure that the BV really was fully treated the first time.  She is here today after begin treated 12/2018 for repeat testing.  Denies discharge or odor today.  Denies urinary symptoms.    Frustrated with weight.  Outpatient options reviewed.  She is interested in [facility] weight Management program.  Information provided.      GYNECOLOGIC HISTORY: Patient's last menstrual period was 08/10/2017 (approximate). Contraception: PMP Menopausal hormone therapy: none  Patient Active Problem List   Diagnosis Date Noted  . History of colon polyps 07/27/2018  . Pain in right foot 02/24/2018  . Unilateral primary osteoarthritis, left knee 02/24/2018  . Essential hypertension 07/22/2017  . Melasma 04/23/2016  . Left ACL tear 03/07/2016  . Dysplasia of cervix, low grade (CIN 1) 09/21/2015  . H/O LEEP 09/21/2015  . 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 Generalized anxiety disorder 05/07/2015  . Migraine without status migrainosus, not intractable 05/07/2015  . Back injury 11/23/2013    Class: Acute    Past Medical History:  Diagnosis Date  . Abnormal Pap smear 8/13   8/13 ASCUS +HPV, 8/14 ASCUS HPV not detected, 06-26-14 ASCUS +HPV HR  . Anemia    WITH PREGNANCY  . Anxiety   . Arthritis    LEFT KNEE  . Hypertension   . Insomnia   . Migraine headache    with aura (visual changes)    Past Surgical History:  Procedure Laterality Date  . APPENDECTOMY  2005  . ARTHROSCOPY WITH ANTERIOR CRUCIATE LIGAMENT (ACL) REPAIR WITH ANTERIOR TIBILIAS GRAFT  03/07/2016    Dr. Thamas Jaegers  . BREAST CYST EXCISION Left 6/13  . 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 DILATATION & CURETTAGE/HYSTEROSCOPY WITH MYOSURE N/A 09/15/2017   Procedure: DILATATION &  CURETTAGE/HYSTEROSCOPY,WITH ROLLERBALL ABLATION;  Surgeon: Jerene Bears, MD;  Location: [facility];  Service: Gynecology;  Laterality: N/A;  . TUBAL LIGATION  1998   BTL    MEDS:   Current Outpatient Medications on File Prior to Visit  Medication Sig Dispense Refill  . clobetasol ointment (TEMOVATE) 0.05 % Apply topically 2 (two) times daily. Do not use for more than 7 days in a row. 30 g 1  . escitalopram (LEXAPRO) 10 MG tablet Take 10 mg by mouth daily.    Marland Kitchen escitalopram (LEXAPRO) 5 MG tablet Take 5 mg by mouth daily.    . ferrous sulfate 325 (65 FE) MG tablet Take 325 mg by mouth daily with breakfast.    . hydrochlorothiazide (HYDRODIURIL) 25 MG tablet Take 1 tablet by mouth daily.    . hydroquinone 4 % cream Apply to affected areas on face nightly for 3 months, then go to every other days for 3 months    . meloxicam (MOBIC) 7.5 MG tablet Take 1 tablet (7.5 mg total) by mouth 2 (two) times daily. 90 tablet 2  . Multiple Vitamins-Minerals (MULTIVITAMIN PO) Take by mouth daily.    . Vitamin D, Ergocalciferol, (DRISDOL) 1.25 MG (50000 UT) CAPS capsule Take 1 capsule (50,000 Units total) by mouth every 7 (seven) days. 12 capsule 1  . estradiol (ESTRACE) 0.1 MG/GM vaginal cream 1 gram vaginally twice weekly (Patient not taking:  Reported on 01/18/2019) 42.5 g 2   No current facility-administered medications on file prior to visit.     ALLERGIES: Flagyl [metronidazole]  Family History  Problem Relation Age of Onset  . Hypertension Mother   . Hypertension Father   . Breast cancer Sister 51  . Breast cancer Paternal Aunt 79       died of pancreatic cancer age 36    SH:  Married, non smoker  Review of Systems  All other systems reviewed and are negative.   PHYSICAL EXAMINATION:    BP 106/70 (BP Location: Right Arm, Patient Position: Sitting, Cuff Size: Large)   Pulse 80   Resp 16   Ht 5\' 8"  (1.727 m)   Wt 238 lb 9.6 oz (108.2 kg)   LMP 08/10/2017 (Approximate)    BMI 36.28 kg/m     General appearance: alert, cooperative and appears stated age Abdomen: soft, non-tender; bowel sounds normal; no masses,  no organomegaly Lymph:  no inguinal LAD noted  Pelvic: External genitalia:  no lesions              Urethra:  normal appearing urethra with no masses, tenderness or lesions              Bartholins and Skenes: normal                 Vagina: normal appearing vagina with normal color and discharge, no lesions              Cervix: no lesions              Bimanual Exam:  Uterus:  normal size, contour, position, consistency, mobility, non-tender              Adnexa: no mass, fullness, tenderness               Chaperone was present for exam.  Assessment: H/o BV with improved symptoms BMI 36  Plan: Vaginitis testing obtained.  fi this is positive, treatment for recurrent BV will be initiated. Information bout weight management program given.  Referral not needed as she can self refer.

## 2019-01-20 LAB — NUSWAB BV AND CANDIDA, NAA
Candida albicans, NAA: NEGATIVE
Candida glabrata, NAA: NEGATIVE

## 2019-01-24 ENCOUNTER — Telehealth: Payer: Self-pay | Admitting: *Deleted

## 2019-01-24 NOTE — Telephone Encounter (Signed)
Notes recorded by Loreta Ave, CMA on 01/24/2019 at 3:35 PM EDT Pt notified. Verbalized understanding. OV scheduled 01/25/19

## 2019-01-24 NOTE — Telephone Encounter (Signed)
-----   Message from Jerene Bears, MD sent at 01/24/2019 12:58 PM EDT ----- Patient can be retested.  I don't think I should treat at this time based on a symptom change until I know if this is BV or due to menopause and she needs vaginal estrogen cream at that point.

## 2019-01-24 NOTE — Telephone Encounter (Signed)
Patient is returning a call to Reina. °

## 2019-01-24 NOTE — Telephone Encounter (Signed)
LM for pt to call back.

## 2019-01-25 ENCOUNTER — Telehealth: Payer: Self-pay | Admitting: Obstetrics & Gynecology

## 2019-01-25 ENCOUNTER — Encounter: Payer: Self-pay | Admitting: Obstetrics & Gynecology

## 2019-01-25 ENCOUNTER — Other Ambulatory Visit: Payer: Self-pay

## 2019-01-25 ENCOUNTER — Ambulatory Visit (INDEPENDENT_AMBULATORY_CARE_PROVIDER_SITE_OTHER): Payer: BC Managed Care – PPO | Admitting: Obstetrics & Gynecology

## 2019-01-25 VITALS — BP 120/70 | HR 72 | Resp 16 | Ht 68.0 in | Wt 238.0 lb

## 2019-01-25 DIAGNOSIS — N898 Other specified noninflammatory disorders of vagina: Secondary | ICD-10-CM | POA: Diagnosis not present

## 2019-01-25 NOTE — Telephone Encounter (Signed)
Patient received results yesterday and has a question.

## 2019-01-25 NOTE — Telephone Encounter (Signed)
Spoke with patient. Patient asking if tx for BV can be changed? Patient is coming in today at 2pm to see Dr. Hyacinth Meeker. Reviewed labs dated 01/18/19, vaginitis testing neg. Advised patient to keep OV as scheduled for evaluation, can further discuss alternatives for tx BV or tx if due to menopause. Patient verbalizes understanding and is agreeable.  Encounter closed.

## 2019-01-25 NOTE — Progress Notes (Signed)
GYNECOLOGY  VISIT  CC:   Recurrent vaginal discharge  HPI: 53 y.o. G60P2002 Married Other or two or more races female here for recurrent orangy discharge since sunday.  Thinks it almost looks rusty in color.  Reports the discharge is better today.  Denies odor.  Denies urinary symptoms.    Pt and I discussed treatment options if testing is positive--showing recurrent/persistnet BV or if negative.  Feel, in that case, symptoms are likely from atrophic vaginitis.  She is comfortable with either treatment.  GYNECOLOGIC HISTORY: No LMP recorded. Patient has had an ablation. Contraception: PMP Menopausal hormone therapy: none  Patient Active Problem List   Diagnosis Date Noted  . History of colon polyps 07/27/2018  . Pain in right foot 02/24/2018  . Unilateral primary osteoarthritis, left knee 02/24/2018  . Essential hypertension 07/22/2017  . Melasma 04/23/2016  . Left ACL tear 03/07/2016  . Dysplasia of cervix, low grade (CIN 1) 09/21/2015  . H/O LEEP 09/21/2015  . 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 Generalized anxiety disorder 05/07/2015  . Migraine without status migrainosus, not intractable 05/07/2015  . Back injury 11/23/2013    Class: Acute    Past Medical History:  Diagnosis Date  . Abnormal Pap smear 8/13   8/13 ASCUS +HPV, 8/14 ASCUS HPV not detected, 06-26-14 ASCUS +HPV HR  . Anemia    WITH PREGNANCY  . Anxiety   . Arthritis    LEFT KNEE  . Hypertension   . Insomnia   . Migraine headache    with aura (visual changes)    Past Surgical History:  Procedure Laterality Date  . APPENDECTOMY  2005  . ARTHROSCOPY WITH ANTERIOR CRUCIATE LIGAMENT (ACL) REPAIR WITH ANTERIOR TIBILIAS GRAFT  03/07/2016    Dr. Thamas Jaegers  . BREAST CYST EXCISION Left 6/13  . 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 DILATATION & CURETTAGE/HYSTEROSCOPY WITH MYOSURE N/A 09/15/2017   Procedure: DILATATION & CURETTAGE/HYSTEROSCOPY,WITH ROLLERBALL ABLATION;  Surgeon: Jerene Bears,  MD;  Location: [facility];  Service: Gynecology;  Laterality: N/A;  . TUBAL LIGATION  1998   BTL    MEDS:   Current Outpatient Medications on File Prior to Visit  Medication Sig Dispense Refill  . clobetasol ointment (TEMOVATE) 0.05 % Apply topically 2 (two) times daily. Do not use for more than 7 days in a row. 30 g 1  . escitalopram (LEXAPRO) 10 MG tablet Take 15 mg by mouth daily.     . ferrous sulfate 325 (65 FE) MG tablet Take 325 mg by mouth daily with breakfast.    . hydrochlorothiazide (HYDRODIURIL) 25 MG tablet Take 1 tablet by mouth daily.    . hydroquinone 4 % cream Apply to affected areas on face nightly for 3 months, then go to every other days for 3 months    . meloxicam (MOBIC) 7.5 MG tablet Take 1 tablet (7.5 mg total) by mouth 2 (two) times daily. 90 tablet 2  . Multiple Vitamins-Minerals (MULTIVITAMIN PO) Take by mouth daily.    . Vitamin D, Ergocalciferol, (DRISDOL) 1.25 MG (50000 UT) CAPS capsule Take 1 capsule (50,000 Units total) by mouth every 7 (seven) days. 12 capsule 1  . estradiol (ESTRACE) 0.1 MG/GM vaginal cream 1 gram vaginally twice weekly (Patient not taking: Reported on 01/18/2019) 42.5 g 2   No current facility-administered medications on file prior to visit.     ALLERGIES: Flagyl [metronidazole]  Family History  Problem Relation Age of Onset  . Hypertension  Mother   . Hypertension Father   . Breast cancer Sister 84  . Breast cancer Paternal Aunt 49       died of pancreatic cancer age 24    SH:  Married, non smoker  Review of Systems  Genitourinary: Positive for vaginal discharge.  All other systems reviewed and are negative.   PHYSICAL EXAMINATION:    BP 120/70 (BP Location: Right Arm, Patient Position: Sitting, Cuff Size: Large)   Pulse 72   Resp 16   Ht 5\' 8"  (1.727 m)   Wt 238 lb (108 kg)   BMI 36.19 kg/m     General appearance: alert, cooperative and appears stated age Abdomen: soft, non-tender; bowel sounds  normal; no masses,  no organomegaly Lymph:  no inguinal LAD noted  Pelvic: External genitalia:  no lesions              Urethra:  normal appearing urethra with no masses, tenderness or lesions              Bartholins and Skenes: normal                 Vagina: normal appearing vagina with normal color and discharge, no lesions              Cervix: no lesions              Bimanual Exam:  Uterus:  normal size, contour, position, consistency, mobility, non-tender              Adnexa: no mass, fullness, tenderness  Chaperone was present for exam.  Assessment: Vaginal discharge in pt with hx of BV  Plan: Vaginitis testing obtained today.  If negative, will proceed with treatment with vaginal estrogen or vaginal vit E suppositories.

## 2019-01-28 LAB — NUSWAB BV AND CANDIDA, NAA
Candida albicans, NAA: NEGATIVE
Candida glabrata, NAA: NEGATIVE

## 2019-02-01 IMAGING — DX DG ANKLE COMPLETE 3+V*L*
3 series · 3 of 3 positions shown · non-contrast
Comparison: None.

CLINICAL DATA: 52 y/o F; fall down front steps with left ankle
swelling of the lateral malleolus.

EXAM:
LEFT ANKLE COMPLETE - 3+ VIEW

[ankle ap]
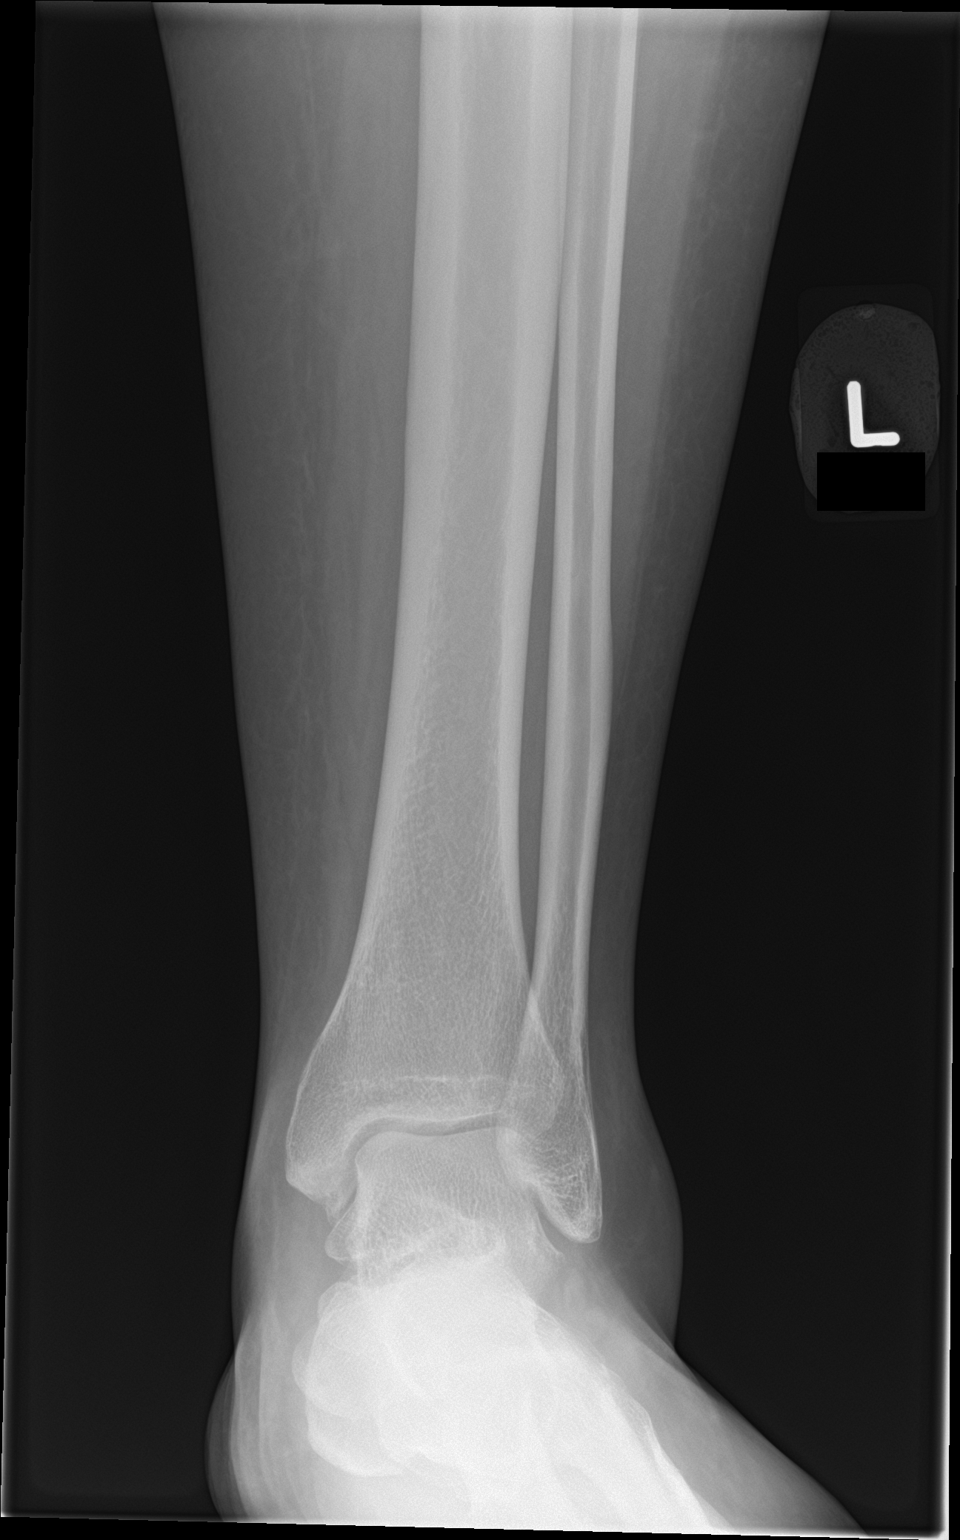

[ankle obl]
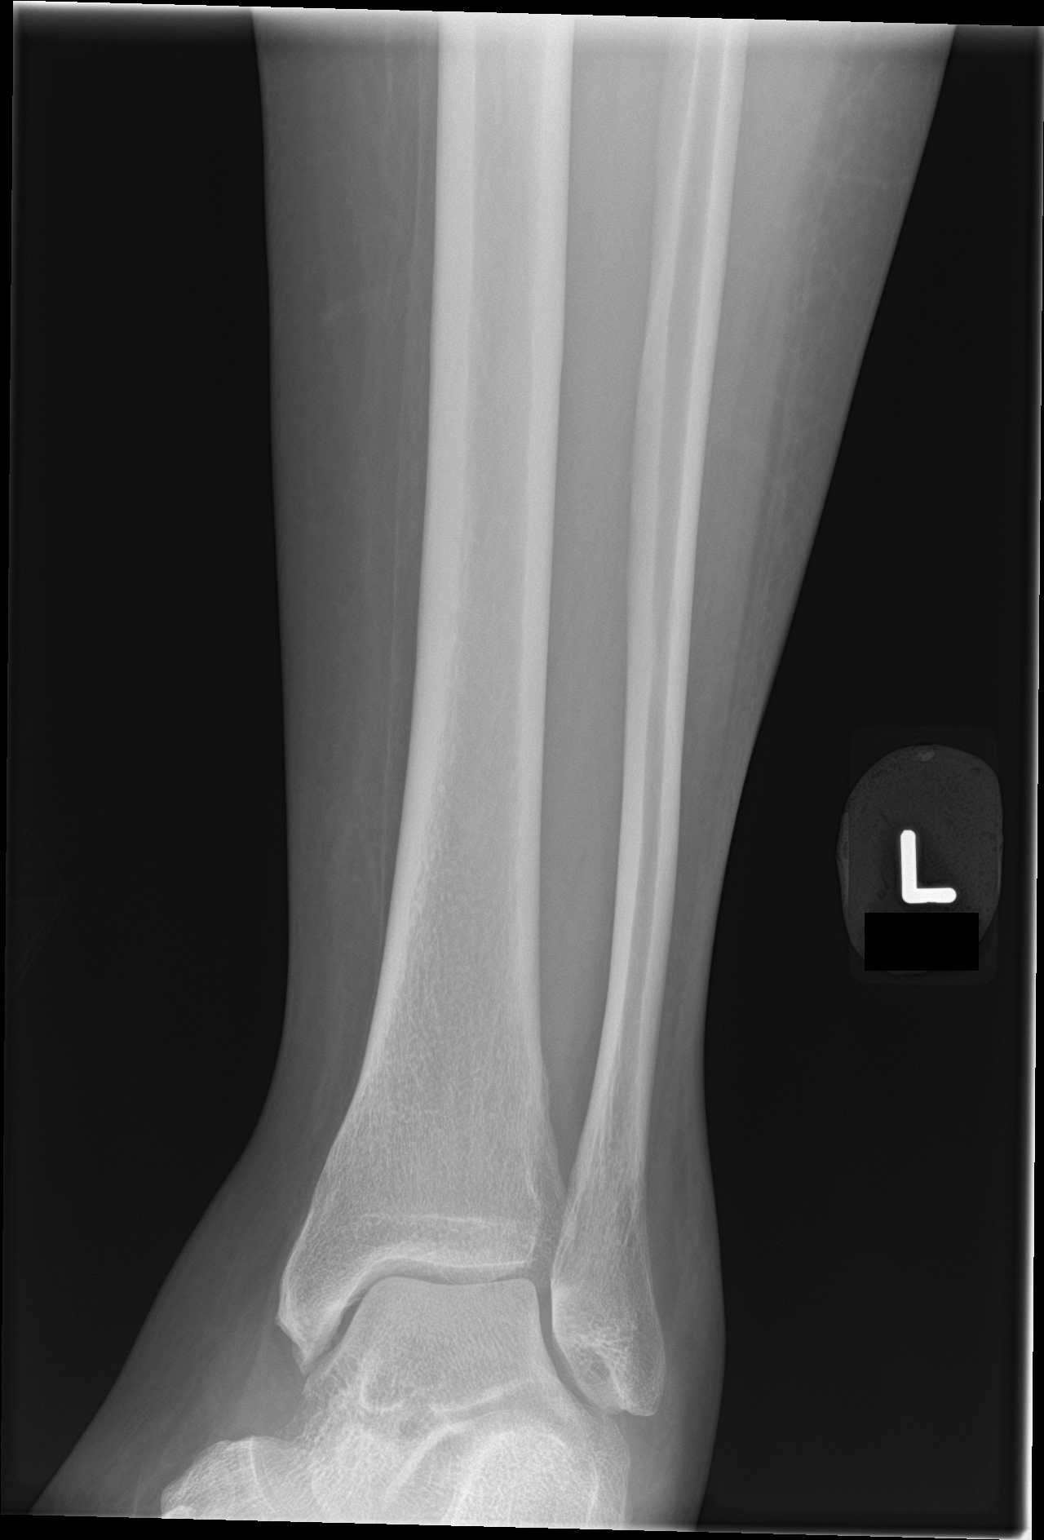

[ankle lat]
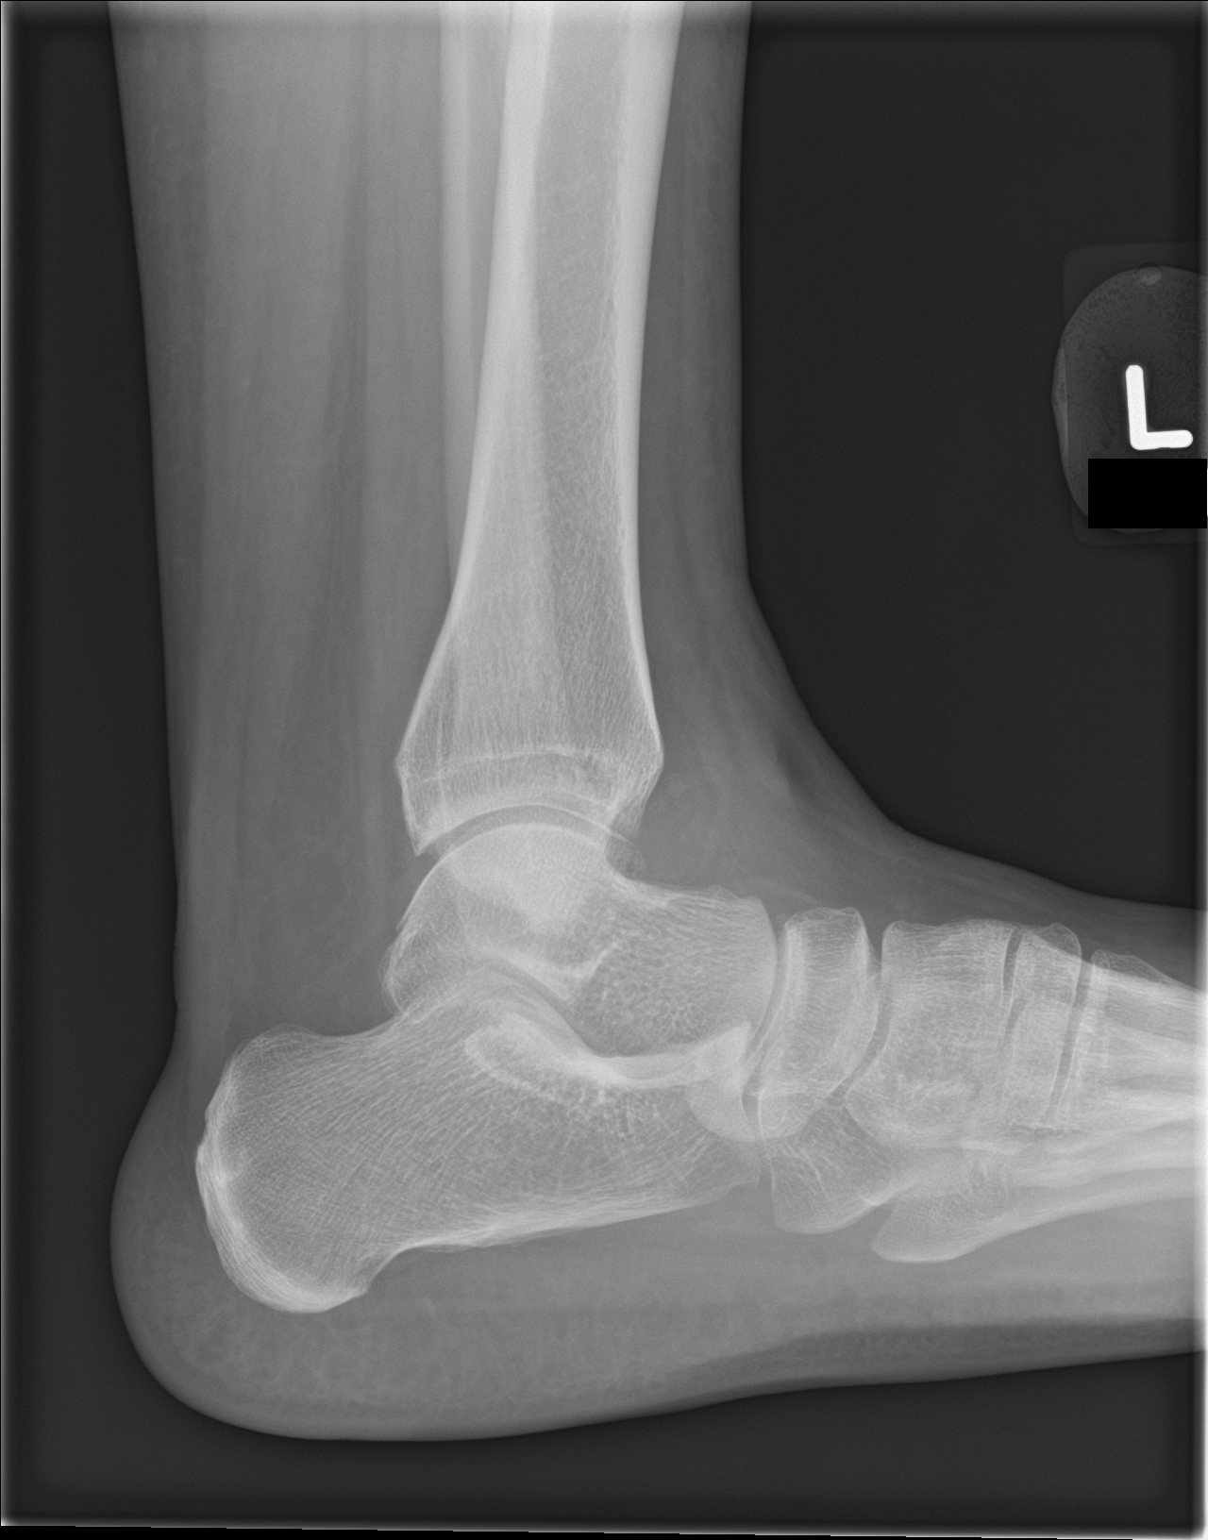

[3 of 3 positions shown; findings below may reference images not displayed]

FINDINGS: There is no evidence of fracture, dislocation, or joint effusion.
There is no evidence of arthropathy or other focal bone abnormality.
Soft tissue swelling greatest over the lateral malleolus.
IMPRESSION: No acute bony or articular abnormality identified. Soft tissue
swelling greatest over the lateral malleolus.

By: Quirijn Amazigh M.D.

## 2019-03-22 ENCOUNTER — Other Ambulatory Visit: Payer: BC Managed Care – PPO

## 2019-04-11 ENCOUNTER — Other Ambulatory Visit: Payer: Self-pay | Admitting: Certified Nurse Midwife

## 2019-04-11 NOTE — Telephone Encounter (Signed)
Prescription refused. Patient needs lab appointment. Lab scheduled for 04/13/19

## 2019-04-13 ENCOUNTER — Other Ambulatory Visit (INDEPENDENT_AMBULATORY_CARE_PROVIDER_SITE_OTHER): Payer: BC Managed Care – PPO

## 2019-04-13 ENCOUNTER — Other Ambulatory Visit: Payer: Self-pay | Admitting: *Deleted

## 2019-04-13 ENCOUNTER — Other Ambulatory Visit: Payer: BC Managed Care – PPO

## 2019-04-13 DIAGNOSIS — E559 Vitamin D deficiency, unspecified: Secondary | ICD-10-CM

## 2019-04-14 LAB — VITAMIN D 25 HYDROXY (VIT D DEFICIENCY, FRACTURES): Vit D, 25-Hydroxy: 35.4 ng/mL (ref 30.0–100.0)

## 2019-04-28 ENCOUNTER — Telehealth: Payer: Self-pay | Admitting: Obstetrics & Gynecology

## 2019-04-28 NOTE — Telephone Encounter (Signed)
Patient calling for vit D results and to find out if she need a prescription or to get something over the counter.

## 2019-04-28 NOTE — Telephone Encounter (Signed)
Called patient and notified of normal Vit D. Advised a message was sent through [facility] stating this--she states she hasn't checked it.

## 2019-06-08 ENCOUNTER — Telehealth: Payer: Self-pay | Admitting: Obstetrics & Gynecology

## 2019-06-08 NOTE — Telephone Encounter (Signed)
Patient is having menopausal issues and not feeling well.

## 2019-06-08 NOTE — Telephone Encounter (Signed)
Spoke with patient. Patient states she has not been herself for the past couple of months. Reports feeling tired and "no pep in my step", weight gain of 10-15lbs, "possible depressed". Has been working with nutritionist, minimal weight loss. Takes awhile to fall asleep, otherwise no trouble sleeping. No personal or lifestyle changes. Denies SI/HI. Using vaginal estrogen cream twice weekly, "no longer working". On lexapro 15 mg daily, prescribed by PCP. Denies fever/chills, pelvic pain, vaginal bleeding,. Hx of ablation.   OV scheduled for 8/3 at 4:30pm with Dr. Sabra Heck. Covid 19 precautions reviewed.   Routing to provider for final review. Patient is agreeable to disposition. Will close encounter.

## 2019-06-13 ENCOUNTER — Ambulatory Visit (INDEPENDENT_AMBULATORY_CARE_PROVIDER_SITE_OTHER): Payer: BC Managed Care – PPO | Admitting: Obstetrics & Gynecology

## 2019-06-13 ENCOUNTER — Encounter: Payer: Self-pay | Admitting: Obstetrics & Gynecology

## 2019-06-13 ENCOUNTER — Other Ambulatory Visit: Payer: Self-pay

## 2019-06-13 VITALS — BP 112/88 | HR 72 | Temp 97.1°F | Ht 68.0 in | Wt 250.0 lb

## 2019-06-13 DIAGNOSIS — F419 Anxiety disorder, unspecified: Secondary | ICD-10-CM

## 2019-06-13 DIAGNOSIS — F321 Major depressive disorder, single episode, moderate: Secondary | ICD-10-CM | POA: Diagnosis not present

## 2019-06-13 MED ORDER — FLUOXETINE HCL 20 MG PO TABS: 20.0000 mg | ORAL_TABLET | Freq: Every day | ORAL | 1 refills | Status: DC

## 2019-06-13 NOTE — Progress Notes (Signed)
GYNECOLOGY  VISIT  CC:   Menopausal symptoms, depressed mood  HPI: 53 y.o. 572P2002 Married [place] female here for discussion of "just not feeling well".   She was recently seen by her PCP who recommended she have follow up with me to discuss whether menopause was causing some of her symptoms.  She and I have discussed HRT in the past and she has not wanted to take any hormonal therapy due to risks.  Still doesn't really want to be on this.    Reports she and her husband have experienced some issues with their marriage, just not getting along.  They both have done therapy and started in April.  Feels that this has been a good thing for them.  Feels this has been a good thing.  They've been together 27 years.  Her early retirement did contribute to the marital issues.  She's been staying in bed until 10am and not feeling motivated to do anything.  After counseling, she and spouse decided she needed a schedule.  She is now trying to get up around 7:15am and then goes at walk for about 30 minutes.    She's started with a nutritionist and she thinks this has been good.  Continues to be frustrated with her weight.    She is having some fatigue.  She is having hot flashes.  She is having some vaginal dryness.  She is using the vaginal estrogen cream.  Does have some dryness with intercourse as well.    Has appt with therapy on Tuesday.  Plants to continue these appts as they have been helpful.    Due to several things she reported to me, felt depression and anxiety inventory tests should be done.  PHQ:  16 GAD:  7  GYNECOLOGIC HISTORY: No LMP recorded. Patient has had an ablation. Contraception: PMP Menopausal hormone therapy: vaginal estrogen cream  Patient Active Problem List   Diagnosis Date Noted  . History of colon polyps 07/27/2018  . Pain in right foot 02/24/2018  . Unilateral primary osteoarthritis, left knee 02/24/2018  . Essential hypertension 07/22/2017  .  Melasma 04/23/2016  . Left ACL tear 03/07/2016  . Dysplasia of cervix, low grade (CIN 1) 09/21/2015  . H/O LEEP 09/21/2015  . 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 Generalized anxiety disorder 05/07/2015  . Migraine without status migrainosus, not intractable 05/07/2015  . Back injury 11/23/2013    Class: Acute    Past Medical History:  Diagnosis Date  . Abnormal Pap smear 8/13   8/13 ASCUS +HPV, 8/14 ASCUS HPV not detected, 06-26-14 ASCUS +HPV HR  . Anemia    WITH PREGNANCY  . Anxiety   . Arthritis    LEFT KNEE  . Hypertension   . Insomnia   . Migraine headache    with aura (visual changes)    Past Surgical History:  Procedure Laterality Date  . APPENDECTOMY  2005  . ARTHROSCOPY WITH ANTERIOR CRUCIATE LIGAMENT (ACL) REPAIR WITH ANTERIOR TIBILIAS GRAFT  03/07/2016    Dr. Thamas JaegersLennon  . BREAST CYST EXCISION Left 6/13  . 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 DILATATION & CURETTAGE/HYSTEROSCOPY WITH MYOSURE N/A 09/15/2017   Procedure: DILATATION & CURETTAGE/HYSTEROSCOPY,WITH ROLLERBALL ABLATION;  Surgeon: Jerene BearsMiller, Bernell Haynie S, MD;  [facility];  Service: Gynecology;  Laterality: N/A;  . TUBAL LIGATION  1998   BTL    MEDS:   Current Outpatient Medications on File Prior to Visit  Medication  Sig Dispense Refill  . clobetasol ointment (TEMOVATE) 0.05 % Apply topically 2 (two) times daily. Do not use for more than 7 days in a row. 30 g 1  . escitalopram (LEXAPRO) 10 MG tablet Take 15 mg by mouth daily.     Marland Kitchen estradiol (ESTRACE) 0.1 MG/GM vaginal cream 1 gram vaginally twice weekly 42.5 g 2  . hydrochlorothiazide (HYDRODIURIL) 25 MG tablet Take 1 tablet by mouth daily.    . hydroquinone 4 % cream Apply to affected areas on face nightly for 3 months, then go to every other days for 3 months    . meloxicam (MOBIC) 7.5 MG tablet Take 1 tablet (7.5 mg total) by mouth 2 (two) times daily. 90 tablet 2  . Multiple Vitamins-Minerals (MULTIVITAMIN PO) Take by  mouth daily.     No current facility-administered medications on file prior to visit.     ALLERGIES: Flagyl [metronidazole]  Family History  Problem Relation Age of Onset  . Hypertension Mother   . Hypertension Father   . Breast cancer Sister 45  . Breast cancer Paternal Aunt 92       died of pancreatic cancer age 54    SH:  Married, non smoker  Review of Systems  Respiratory: Negative.   Cardiovascular: Negative.   Gastrointestinal: Negative.   Genitourinary: Negative.   Psychiatric/Behavioral: Positive for dysphoric mood. Negative for agitation and suicidal ideas. The patient is nervous/anxious.     PHYSICAL EXAMINATION:    BP 112/88   Pulse 72   Temp (!) 97.1 F (36.2 C) (Temporal)   Ht 5\' 8"  (1.727 m)   Wt 250 lb (113.4 kg)   BMI 38.01 kg/m     General appearance: alert, cooperative and appears stated age  Assessment: Moderate depression Anxiety Long term Lexapro use  Plan: She is going to taper off the lexapro and start Prozac as well.  She will take 10mg  daily for 6 days and then increase to 20mg .   Plan to recheck 3 weeks.  She knows to call with any new issues/concerns. She will continue with her therapist.    ~Lengthy visit with pt today.  ~60 minutes spent with patient >50% of time was in face to face discussion of above.

## 2019-06-18 ENCOUNTER — Telehealth: Payer: Self-pay | Admitting: Obstetrics and Gynecology

## 2019-06-18 DIAGNOSIS — F321 Major depressive disorder, single episode, moderate: Secondary | ICD-10-CM

## 2019-06-18 DIAGNOSIS — F419 Anxiety disorder, unspecified: Secondary | ICD-10-CM

## 2019-06-18 NOTE — Telephone Encounter (Signed)
Phone call after hours on the weekend.   States Dr. Sabra Heck switched her from Lexapro to Prozac.  She has been on Lexapro since 2008 and felt it was not working and that she was gaining weight.  Started Prozac 3 days ago.  Her last dose of Prozac was this am.  Wilburn Mylar, she felt dizzy and queasy and a headache for 30 - 45 minutes that resolved but then returned today.  She is off Lexapro completely.   She states her mood is ok.   Denies fever.  Denies sore throat throat, congestion, cogh, or short of breath.  Denies vomiting or diarrhea.  Denies muscle aching.   I recommend she discontinue the Prozac completely and start her Lexapro back at 15 mg daily starting this evening.  She will call the office this week to make an appointment to see Dr. Sabra Heck upon return to the office or be seen by me or other provider.   She will be seen by urgent care or go to the ER if she has progression of her symptoms or develops symptoms of Covid 19 reviewed above.   She will reach out to her family or call me back if she is feeling increased signs of depression or develops suicidal ideation.    She knows I am on call for the rest of the weekend.

## 2019-06-19 NOTE — Telephone Encounter (Signed)
I feel this patient may benefit from a psychiatry referral.  She does have depression and anxiety despite being on lexapro.  I'm routing this to Sharee Pimple to see if she can be seen at [facility] on more urgent basis.  [place] think [place] in [place].  I had an almost 60 minute visit with her and I would like there for be a plan before a week from now.  Thank you.

## 2019-06-20 NOTE — Telephone Encounter (Signed)
Left message to call Sharee Pimple, RN at [facility].    Urgent referral placed to psychiatry.

## 2019-06-20 NOTE — Telephone Encounter (Signed)
Spoke with Regan at [facility]. Place order for referral to outpatient [facility] for psychiatry referral. If patient needs immediate evaluation she should go directly to [facility] at [address] [place], [place] [address]. Patient can call [phone] or [phone]. Contact Tamara in referrals at [phone].

## 2019-06-20 NOTE — Telephone Encounter (Signed)
Spoke with patient, advised as seen below per Dr. Sabra Heck.  Has appt with her therapist, Dr. Tiffany Kocher, on 06/21/19, will proceed with appt as scheduled. Would also like to proceed with referral to psychiatry, advised urgent referral placed, reviewed options for immediate evaluation at [facility] needed. Patient states she feels better today, denies SI/HI. Has stopped prozac and back to Lexapro 15mg  daily, headache has resolved, queasy stomach has improved. Patient is aware to return call to office if any additional assistance needed or for any additional questions/concnerns.   Routing to Dr. Quincy Simmonds for review.   Cc: Michaela Hanson

## 2019-06-20 NOTE — Telephone Encounter (Signed)
Please let me know when the patient has the psychiatry appointment scheduled.   Cc- Dr. Sabra Heck

## 2019-06-21 NOTE — Telephone Encounter (Signed)
Michaela Hanson unable to view psychiatry referral placed on 06/20/19, new order placed.

## 2019-06-22 NOTE — Telephone Encounter (Signed)
Call to [facility] and spoke with rep. She did give me the fax number to fax the referral. Referral faxed to [phone].

## 2019-06-27 NOTE — Telephone Encounter (Signed)
Thanks for the update.  Ok to close encounter. 

## 2019-06-27 NOTE — Telephone Encounter (Signed)
Encounter closed per Dr. Miller.  

## 2019-06-27 NOTE — Telephone Encounter (Signed)
Per referral note from [facility] on 06-24-2019 at 10:09 am, "Pt states services are no longer needed."   Routing to provider for review.

## 2019-06-27 NOTE — Telephone Encounter (Signed)
Can you give me any follow-up on this pt's appt?  I can't see anything in Epic.  She does have follow up with me on 07/05/2019 but I was hoping she would see a psychiatric provider as well.  Thanks.

## 2019-07-01 ENCOUNTER — Telehealth: Payer: Self-pay | Admitting: Obstetrics & Gynecology

## 2019-07-01 NOTE — Telephone Encounter (Signed)
Patient canceled her E-Visit 07/05/19. She said "I do not need a follow up because I switched back to my old medication after talking with Dr.Miller's nurse".

## 2019-07-05 ENCOUNTER — Telehealth: Payer: BC Managed Care – PPO | Admitting: Obstetrics & Gynecology

## 2019-07-07 ENCOUNTER — Telehealth: Payer: BC Managed Care – PPO | Admitting: Obstetrics & Gynecology

## 2019-08-11 HISTORY — PX: ANKLE FRACTURE SURGERY: SHX122

## 2019-11-24 ENCOUNTER — Other Ambulatory Visit: Payer: Self-pay

## 2019-11-28 ENCOUNTER — Other Ambulatory Visit: Payer: Self-pay

## 2019-11-28 ENCOUNTER — Ambulatory Visit (INDEPENDENT_AMBULATORY_CARE_PROVIDER_SITE_OTHER): Payer: BC Managed Care – PPO | Admitting: Obstetrics & Gynecology

## 2019-11-28 ENCOUNTER — Encounter: Payer: Self-pay | Admitting: Obstetrics & Gynecology

## 2019-11-28 ENCOUNTER — Other Ambulatory Visit (HOSPITAL_COMMUNITY)
Admission: RE | Admit: 2019-11-28 | Discharge: 2019-11-28 | Disposition: A | Discharge status: Home / Self Care | Payer: BC Managed Care – PPO | Source: Ambulatory Visit | Attending: Obstetrics & Gynecology | Admitting: Obstetrics & Gynecology

## 2019-11-28 VITALS — BP 120/70 | HR 76 | Temp 97.0°F | Resp 12 | Ht 68.0 in | Wt 256.8 lb

## 2019-11-28 DIAGNOSIS — Z0000 Encounter for general adult medical examination without abnormal findings: Secondary | ICD-10-CM

## 2019-11-28 DIAGNOSIS — Z124 Encounter for screening for malignant neoplasm of cervix: Secondary | ICD-10-CM | POA: Insufficient documentation

## 2019-11-28 DIAGNOSIS — Z01419 Encounter for gynecological examination (general) (routine) without abnormal findings: Secondary | ICD-10-CM | POA: Diagnosis not present

## 2019-11-28 NOTE — Patient Instructions (Signed)
[facility].  [address]

## 2019-11-28 NOTE — Progress Notes (Signed)
54 y.o. G34P2002 Married [place] or [place] American female here for annual exam.    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  This involved some travel.  Going to into a location for her job, she stepped in a hole and shattered her foot.  Had six hour surgery in October.  Was in a cast for 6 1/2.  Then in boot for 3 1/2 weeks.  Started physical therapy last week.  Has started weight bearing.  Now just achy and takes ibuprofen.  Had to stop job.  Having swelling now in foot due to increased mobility.  Supervisor has been really good to work with and she hopes to be back at work.  She is Lexapro.  Is being followed for this by [facility].  May want change in medication.  No LMP recorded. Patient has had an ablation.          Sexually active: Yes.    The current method of family planning is ablation.    Exercising: No.  The patient does not participate in regular exercise at present. patient is currently in physical therapy Smoker:  no  Health Maintenance: Pap:   11/05/18 Neg:Neg HR HPV  10/16/17 Negative, neg HR HPV             09/08/16 negative, HR HPV negative 04/08/16 negative 08/27/15 negative, HR HPV + History of abnormal Pap:  yes MMG:  06/24/19 BIRADS 1 negative/density b Colonoscopy:  06/17/18, adenomatous polyp.  Repeat due in 5 years BMD:   n/a TDaP:  05/30/15 Pneumonia vaccine(s):  n/a Shingrix:   Discussed with pt today Hep C testing: n/a Screening Labs: PCP   reports that she has never smoked. She has never used smokeless tobacco. She reports that she does not drink alcohol or use drugs.  Past Medical History:  Diagnosis Date  . Abnormal Pap smear 8/13   8/13 ASCUS +HPV, 8/14 ASCUS HPV not detected, 06-26-14 ASCUS +HPV HR  . Anemia    WITH PREGNANCY  . Anxiety   . Arthritis    LEFT KNEE  . Hypertension   . Insomnia   . Migraine headache    with aura (visual changes)    Past Surgical History:  Procedure Laterality Date   . ANKLE FRACTURE SURGERY Right 08/2019  . APPENDECTOMY  2005  . ARTHROSCOPY WITH ANTERIOR CRUCIATE LIGAMENT (ACL) REPAIR WITH ANTERIOR TIBILIAS GRAFT  03/07/2016    Dr. Thamas Jaegers  . BREAST CYST EXCISION Left 6/13  . 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 DILATATION & CURETTAGE/HYSTEROSCOPY WITH MYOSURE N/A 09/15/2017   Procedure: DILATATION & CURETTAGE/HYSTEROSCOPY,WITH ROLLERBALL ABLATION;  Surgeon: Jerene Bears, MD;  Location: [facility];  Service: Gynecology;  Laterality: N/A;  . TUBAL LIGATION  1998   BTL    Current Outpatient Medications  Medication Sig Dispense Refill  . clobetasol ointment (TEMOVATE) 0.05 % Apply topically 2 (two) times daily. Do not use for more than 7 days in a row. 30 g 1  . escitalopram (LEXAPRO) 10 MG tablet Take 15 mg by mouth daily.     Marland Kitchen estradiol (ESTRACE) 0.1 MG/GM vaginal cream 1 gram vaginally twice weekly 42.5 g 2  . hydrochlorothiazide (HYDRODIURIL) 25 MG tablet Take 1 tablet by mouth daily.    . hydroquinone 4 % cream Apply to affected areas on face nightly for 3 months, then go to every other days for 3 months    . levocetirizine (XYZAL)  5 MG tablet Take by mouth.    . Multiple Vitamins-Minerals (MULTIVITAMIN PO) Take by mouth daily.     No current facility-administered medications for this visit.    Family History  Problem Relation Age of Onset  . Hypertension Mother   . Hypertension Father   . Breast cancer Sister 67  . Breast cancer Paternal Aunt 57       died of pancreatic cancer age 60    Review of Systems  All other systems reviewed and are negative.   Exam:   BP 120/70 (BP Location: Left Arm, Patient Position: Sitting, Cuff Size: Normal)   Pulse 76   Temp (!) 97 F (36.1 C) (Temporal)   Resp 12   Ht 5\' 8"  (1.727 m)   Wt 256 lb 12.8 oz (116.5 kg)   BMI 39.05 kg/m   Height: 5\' 8"  (172.7 cm)  Ht Readings from Last 3 Encounters:  11/28/19 5\' 8"  (1.727 m)  06/13/19 5\' 8"  (1.727 m)  01/25/19 5\' 8"  (1.727 m)     General appearance: alert, cooperative and appears stated age Head: Normocephalic, without obvious abnormality, atraumatic Neck: no adenopathy, supple, symmetrical, trachea midline and thyroid normal to inspection and palpation Lungs: clear to auscultation bilaterally Breasts: normal appearance, no masses or tenderness Heart: regular rate and rhythm Abdomen: soft, non-tender; bowel sounds normal; no masses,  no organomegaly Extremities: extremities normal, atraumatic, no cyanosis or edema Skin: Skin color, texture, turgor normal. No rashes or lesions Lymph nodes: Cervical, supraclavicular, and axillary nodes normal. No abnormal inguinal nodes palpated Neurologic: Grossly normal  Pelvic: External genitalia:  no lesions              Urethra:  normal appearing urethra with no masses, tenderness or lesions              Bartholins and Skenes: normal                 Vagina: normal appearing vagina with normal color and discharge, no lesions              Cervix: no lesions              Pap taken: No. Bimanual Exam:  Uterus:  normal size, contour, position, consistency, mobility, non-tender              Adnexa: normal adnexa and no mass, fullness, tenderness               Rectovaginal: Confirms               Anus:  normal sphincter tone, no lesions  Chaperone, Terence Lux, CMA, was present for exam.  A:  Well Woman with normal exam Hypertension Anxiety/depression H/o abnormal pap smear with +HR HPV 2016 H/o menorrhagia with irregular cycles, s/p roller ball ablation Right ankle fracture  P:   Mammogram guidelines reviewed pap smear only obtained pt pt request.  No HR HPV testing obtained today No RF for estradiol cream.  Will call if/when she needs Lab work obtained today:  CBC, CMP, Lipids, HbA1C ,Vit D, and TSH Colonoscopy is UTD Shingrix vaccination discussed today Return annually or prn

## 2019-11-29 LAB — COMPREHENSIVE METABOLIC PANEL
ALT: 14 IU/L (ref 0–32)
AST: 20 IU/L (ref 0–40)
Albumin/Globulin Ratio: 1.5 (ref 1.2–2.2)
Albumin: 4.4 g/dL (ref 3.8–4.9)
Alkaline Phosphatase: 89 IU/L (ref 39–117)
BUN/Creatinine Ratio: 14 (ref 9–23)
BUN: 12 mg/dL (ref 6–24)
Bilirubin Total: 0.3 mg/dL (ref 0.0–1.2)
CO2: 27 mmol/L (ref 20–29)
Calcium: 9.6 mg/dL (ref 8.7–10.2)
Chloride: 104 mmol/L (ref 96–106)
Creatinine, Ser: 0.83 mg/dL (ref 0.57–1.00)
GFR calc Af Amer: 94 mL/min/{1.73_m2} (ref >59)
GFR calc non Af Amer: 82 mL/min/{1.73_m2} (ref >59)
Globulin, Total: 3 g/dL (ref 1.5–4.5)
Glucose: 97 mg/dL (ref 65–99)
Potassium: 4.6 mmol/L (ref 3.5–5.2)
Sodium: 142 mmol/L (ref 134–144)
Total Protein: 7.4 g/dL (ref 6.0–8.5)

## 2019-11-29 LAB — CYTOLOGY - PAP
Adequacy: ABSENT
Diagnosis: NEGATIVE

## 2019-11-29 LAB — LIPID PANEL
Chol/HDL Ratio: 2.9 ratio (ref 0.0–4.4)
Cholesterol, Total: 172 mg/dL (ref 100–199)
HDL: 60 mg/dL (ref >39)
LDL Chol Calc (NIH): 102 mg/dL — ABNORMAL HIGH (ref 0–99)
Triglycerides: 52 mg/dL (ref 0–149)
VLDL Cholesterol Cal: 10 mg/dL (ref 5–40)

## 2019-11-29 LAB — VITAMIN D 25 HYDROXY (VIT D DEFICIENCY, FRACTURES): Vit D, 25-Hydroxy: 21 ng/mL — ABNORMAL LOW (ref 30.0–100.0)

## 2019-11-29 LAB — TSH: TSH: 2.49 u[IU]/mL (ref 0.450–4.500)

## 2019-11-29 LAB — CBC
Hematocrit: 33.4 % — ABNORMAL LOW (ref 34.0–46.6)
Hemoglobin: 10.9 g/dL — ABNORMAL LOW (ref 11.1–15.9)
MCH: 30.4 pg (ref 26.6–33.0)
MCHC: 32.6 g/dL (ref 31.5–35.7)
MCV: 93 fL (ref 79–97)
Platelets: 233 10*3/uL (ref 150–450)
RBC: 3.58 x10E6/uL — ABNORMAL LOW (ref 3.77–5.28)
RDW: 13.2 % (ref 11.7–15.4)
WBC: 3.4 10*3/uL (ref 3.4–10.8)

## 2019-11-29 LAB — HEMOGLOBIN A1C
Est. average glucose Bld gHb Est-mCnc: 117 mg/dL
Hgb A1c MFr Bld: 5.7 % — ABNORMAL HIGH (ref 4.8–5.6)

## 2019-12-06 DIAGNOSIS — I440 Atrioventricular block, first degree: Secondary | ICD-10-CM | POA: Insufficient documentation

## 2019-12-13 ENCOUNTER — Telehealth: Payer: Self-pay | Admitting: Obstetrics & Gynecology

## 2019-12-13 NOTE — Telephone Encounter (Signed)
Patient calling to let us know that her PCP is going to take care of monitoring her vitamin d and iron levels. Cancelled upcoming lab appointments.

## 2019-12-14 ENCOUNTER — Other Ambulatory Visit: Payer: Self-pay

## 2019-12-14 NOTE — Telephone Encounter (Signed)
Reviewed 11/28/19 labs, patient need f/u iron levels and vit d.   Routing to Dr. Gloris Ham.

## 2020-01-21 ENCOUNTER — Ambulatory Visit: Payer: BC Managed Care – PPO

## 2020-01-25 ENCOUNTER — Ambulatory Visit: Payer: BC Managed Care – PPO | Admitting: Physician Assistant

## 2020-01-26 ENCOUNTER — Ambulatory Visit (INDEPENDENT_AMBULATORY_CARE_PROVIDER_SITE_OTHER): Payer: BC Managed Care – PPO | Admitting: Physician Assistant

## 2020-01-26 ENCOUNTER — Ambulatory Visit: Payer: Self-pay

## 2020-01-26 ENCOUNTER — Other Ambulatory Visit: Payer: Self-pay

## 2020-01-26 ENCOUNTER — Encounter: Payer: Self-pay | Admitting: Physician Assistant

## 2020-01-26 DIAGNOSIS — M5136 Other intervertebral disc degeneration, lumbar region: Secondary | ICD-10-CM | POA: Diagnosis not present

## 2020-01-26 DIAGNOSIS — M1712 Unilateral primary osteoarthritis, left knee: Secondary | ICD-10-CM | POA: Diagnosis not present

## 2020-01-26 MED ORDER — CYCLOBENZAPRINE HCL 10 MG PO TABS: 10.0000 mg | ORAL_TABLET | Freq: Three times a day (TID) | ORAL | 0 refills | Status: DC | PRN

## 2020-01-26 MED ORDER — METHYLPREDNISOLONE 4 MG PO TABS: ORAL_TABLET | ORAL | 0 refills | Status: DC

## 2020-01-26 NOTE — Progress Notes (Signed)
Office Visit Note   Patient: Michaela Hanson           Date of Birth: 04-14-1966           MRN: [id number] Visit Date: 01/26/2020              Requested by: Clide Dales, PA [address],  [place] [address] PCP: Clide Dales, PA   Assessment & Plan: Visit Diagnoses:  1. Primary osteoarthritis of left knee   2. Lumbar degenerative disc disease     Plan: We will place her on a Medrol Dosepak she is to avoid all NSAIDs while on the Medrol Dosepak and this is discussed with the patient.  Also she is given Flexeril.  Moist heat to low back.  We will send her to formal physical therapy for back exercises, home exercise program, quad strengthening, and modalities.  She will follow-up with [place] in a month to check her progress.  Questions encouraged and answered.  Follow-Up Instructions: Return in about 4 weeks (around 02/23/2020).   Orders:  Orders Placed This Encounter  Procedures  . XR Lumbar Spine 2-3 Views  . XR Knee 1-2 Views Left   Meds ordered this encounter  Medications  . methylPREDNISolone (MEDROL) 4 MG tablet    Sig: Take as directed    Dispense:  21 tablet    Refill:  0  . cyclobenzaprine (FLEXERIL) 10 MG tablet    Sig: Take 1 tablet (10 mg total) by mouth 3 (three) times daily as needed for muscle spasms.    Dispense:  30 tablet    Refill:  0      Procedures: No procedures performed   Clinical Data: No additional findings.   Subjective: Chief Complaint  Patient presents with  . Lower Back - Pain  . Left Knee - Pain    HPI Michaela Hanson is well-known to our department service comes in today for new onset of low back pain and for left knee pain.  She unfortunately on 09/01/2019 stepped in a hole and had an open trimalleolar ankle fracture.  This was treated at [facility] in [place].  She is just got out of her cam walker boot a month and a half ago.  She is in physical therapy.  7 low back pain which she  has had in the past which was treated conservatively and she is doing well with.  She denies any radicular symptoms down either leg.  She has low back pain with standing for prolonged periods of time.  She is also having left knee pain without mechanical symptoms.  She has tried ibuprofen and ice for both of these without any real relief.  She is ambulating with a cane secondary to her ankle fracture. Review of Systems See HPI otherwise negative  Objective: Vital Signs: There were no vitals taken for this visit.  Physical Exam Constitutional:      Appearance: She is not ill-appearing or diaphoretic.  Pulmonary:     Effort: Pulmonary effort is normal.  Neurological:     Mental Status: She is alert and oriented to person, place, and time.  Psychiatric:        Mood and Affect: Mood normal.     Ortho Exam Bilateral lower extremities 5 out of 5 strength throughout except for dorsiflexion of the right ankle against resistance which is 4 out of 5 secondary to recent ankle fracture.  Straight leg raise negative bilaterally.  Tenderness right lower lumbar paraspinous region.  Bilateral knees full range of motion significant patellofemoral crepitus bilaterally.  Tenderness along medial joint line of both knees.  No instability with valgus varus stressing of either knee.  No abnormal warmth erythema or effusion of bilateral knees. Right ankle surgical incisions are all healing well no signs of infection or wound issues. Specialty Comments:  No specialty comments available.  Imaging: XR Knee 1-2 Views Left  Result Date: 01/26/2020 Left knee 2 views: No acute fracture.  Knee is well located.  Mild to moderate patellofemoral changes.  Moderate medial joint line narrowing.  No acute fractures.  No bony abnormalities otherwise.  XR Lumbar Spine 2-3 Views  Result Date: 01/26/2020 Lumbar spine 2 views: Generative this disease proximal L-spine.  Loss of lordotic curvature.  No spondylolisthesis.  No  acute fractures.    PMFS History: Patient Active Problem List   Diagnosis Date Noted  . History of colon polyps 07/27/2018  . Pain in right foot 02/24/2018  . Unilateral primary osteoarthritis, left knee 02/24/2018  . Essential hypertension 07/22/2017  . Melasma 04/23/2016  . Left ACL tear 03/07/2016  . Dysplasia of cervix, low grade (CIN 1) 09/21/2015  . H/O LEEP 09/21/2015  . 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 Generalized anxiety disorder 05/07/2015  . Migraine without status migrainosus, not intractable 05/07/2015  . Back injury 11/23/2013    Class: Acute   Past Medical History:  Diagnosis Date  . Abnormal Pap smear 8/13   8/13 ASCUS +HPV, 8/14 ASCUS HPV not detected, 06-26-14 ASCUS +HPV HR  . Anemia    WITH PREGNANCY  . Anxiety   . Arthritis    LEFT KNEE  . Hypertension   . Insomnia   . Migraine headache    with aura (visual changes)    Family History  Problem Relation Age of Onset  . Hypertension Mother   . Hypertension Father   . Breast cancer Sister 11  . Breast cancer Paternal Aunt 19       died of pancreatic cancer age 48    Past Surgical History:  Procedure Laterality Date  . ANKLE FRACTURE SURGERY Right 08/2019  . APPENDECTOMY  2005  . ARTHROSCOPY WITH ANTERIOR CRUCIATE LIGAMENT (ACL) REPAIR WITH ANTERIOR TIBILIAS GRAFT  03/07/2016    Dr. Len Childs  . BREAST CYST EXCISION Left 6/13  . 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 DILATATION & CURETTAGE/HYSTEROSCOPY WITH MYOSURE N/A 09/15/2017   Procedure: DILATATION & CURETTAGE/HYSTEROSCOPY,WITH ROLLERBALL ABLATION;  Surgeon: Megan Salon, MD;  Location[facility];  Service: Gynecology;  Laterality: N/A;  . TUBAL LIGATION  1998   BTL   Social History   Occupational History  . Not on file  Tobacco Use  . Smoking status: Never Smoker  . Smokeless tobacco: Never Used  Substance and Sexual Activity  . Alcohol use: No  . Drug use: No  . Sexual activity: Yes    Partners:  Male    Birth control/protection: Surgical    Comment: BTL

## 2020-01-30 ENCOUNTER — Encounter: Payer: Self-pay | Admitting: Certified Nurse Midwife

## 2020-02-23 ENCOUNTER — Ambulatory Visit: Payer: BC Managed Care – PPO | Admitting: Physician Assistant

## 2020-03-05 ENCOUNTER — Other Ambulatory Visit: Payer: Self-pay

## 2020-03-05 ENCOUNTER — Ambulatory Visit (INDEPENDENT_AMBULATORY_CARE_PROVIDER_SITE_OTHER): Payer: BC Managed Care – PPO | Admitting: Physician Assistant

## 2020-03-05 ENCOUNTER — Encounter: Payer: Self-pay | Admitting: Physician Assistant

## 2020-03-05 DIAGNOSIS — M5136 Other intervertebral disc degeneration, lumbar region: Secondary | ICD-10-CM

## 2020-03-05 DIAGNOSIS — M1712 Unilateral primary osteoarthritis, left knee: Secondary | ICD-10-CM

## 2020-03-05 MED ORDER — DIAZEPAM 5 MG PO TABS: ORAL_TABLET | ORAL | 0 refills | Status: DC

## 2020-03-05 NOTE — Progress Notes (Signed)
HPI: Michaela Hanson returns today 4 weeks follow-up of her left knee pain and low back pain.  She states that her left knee is doing well at this point time.  She states that her low back pain is main unchanged.  Is worse with standing.  Is achy constant.  She denies any waking pain.  Denies any radicular symptoms down either leg.  She has gone to therapy for her back and states that this helps while she is there but once she finishes her back pain returns.  He also worked on Dance movement psychotherapist for her knee.  Review of systems: Please see HPI otherwise negative  Physical exam: General well-developed well-nourished female no acute distress mood and affect appropriate.  Psych: Alert and oriented x3 Left knee: Good range of motion without pain. Lumbar spine: Negative straight leg raise bilaterally.  Tenderness lower lumbar right paraspinous region and over the lower lumbar sacral spinal column.  She has limited flexion comes within 6 inches of touching her toes.  Also limited extension lumbar spine with discomfort.  Impression: Left knee pain Low back pain without radicular symptoms  Plan: Due to the fact the patient is failed conservative treatment of her back with therapy and medications also time recommend MRI for epidural steroid injection planning.  MRIs to evaluate for facet arthritis as a source of her pain versus a HNP as a source of her pain.  She will follow-up with [place] after the MRI to go over the results discuss further treatment.  Questions encouraged and answered.  We did send in Valium today due to the fact that she is claustrophobic to take at night.

## 2020-03-06 ENCOUNTER — Other Ambulatory Visit: Payer: Self-pay

## 2020-03-06 ENCOUNTER — Ambulatory Visit: Payer: BC Managed Care – PPO | Admitting: Obstetrics & Gynecology

## 2020-03-06 DIAGNOSIS — M4807 Spinal stenosis, lumbosacral region: Secondary | ICD-10-CM

## 2020-03-07 ENCOUNTER — Ambulatory Visit: Payer: BC Managed Care – PPO | Admitting: Physician Assistant

## 2020-03-09 ENCOUNTER — Other Ambulatory Visit: Payer: Self-pay

## 2020-03-17 ENCOUNTER — Other Ambulatory Visit: Payer: Self-pay

## 2020-03-17 ENCOUNTER — Ambulatory Visit (HOSPITAL_BASED_OUTPATIENT_CLINIC_OR_DEPARTMENT_OTHER)
Admission: RE | Admit: 2020-03-17 | Discharge: 2020-03-17 | Disposition: A | Discharge status: Home / Self Care | Payer: BC Managed Care – PPO | Source: Ambulatory Visit | Attending: Orthopaedic Surgery | Admitting: Orthopaedic Surgery

## 2020-03-17 DIAGNOSIS — M4807 Spinal stenosis, lumbosacral region: Secondary | ICD-10-CM | POA: Diagnosis not present

## 2020-03-21 ENCOUNTER — Ambulatory Visit (INDEPENDENT_AMBULATORY_CARE_PROVIDER_SITE_OTHER): Payer: BC Managed Care – PPO | Admitting: Physician Assistant

## 2020-03-21 ENCOUNTER — Encounter: Payer: Self-pay | Admitting: Physician Assistant

## 2020-03-21 ENCOUNTER — Other Ambulatory Visit: Payer: Self-pay

## 2020-03-21 DIAGNOSIS — M4807 Spinal stenosis, lumbosacral region: Secondary | ICD-10-CM

## 2020-03-21 MED ORDER — DIAZEPAM 5 MG PO TABS: ORAL_TABLET | ORAL | 0 refills | Status: DC

## 2020-03-21 MED ORDER — DIAZEPAM 5 MG PO TABS
ORAL_TABLET | ORAL | 0 refills | Status: DC
Start: 2020-03-21 — End: 2020-03-21

## 2020-03-21 NOTE — Progress Notes (Signed)
Office Visit Note   Patient: Michaela Hanson           Date of Birth: November 19, 1965           MRN: [id number] Visit Date: 03/21/2020              Requested by: Clide Dales, PA [address] [place],  [place] [address] PCP: Clide Dales, PA   Assessment & Plan: Visit Diagnoses:  1. Spinal stenosis of lumbosacral region     Plan: Recommend lumbar epidural steroid injection.  We will set this up for her.  She has somewhat reluctant to have an epidural steroid injections that she has had these in the past and had one bad experience.  Therefore claims Valium that she can use prior to the procedure.  Questions were encouraged and answered.  We will see her back in 4 weeks to see what type of response she had to the epidural steroid injection.  Follow-Up Instructions: Return in about 4 weeks (around 04/18/2020).   Orders:  No orders of the defined types were placed in this encounter.  No orders of the defined types were placed in this encounter.     Procedures: No procedures performed   Clinical Data: No additional findings.   Subjective: Chief Complaint  Patient presents with  . Lower Back - Follow-up    HPI Nutrition returns today to go over the MRI of the lumbar spine.  She continues to have low back pain that has remained unchanged.  Pain is constant achy worse with standing or sitting for prolonged periods of time she denies any weakening pain.  Denies any radicular symptoms down either leg.  She has failed conservative treatment which is consisted of physical therapy and medications. MRI of the lumbar spine showed L4-5 and L5-S1 with moderate spinal stenosis.  Subarticular recess narrowing was felt could affect either S1 nerve root.  Review of Systems Negative for fevers chills shortness of breath chest pain.  Please see HPI  Objective: Vital Signs: There were no vitals taken for this visit.  Physical Exam Constitutional:      Appearance:  She is not ill-appearing or diaphoretic.  Neurological:     Mental Status: She is alert and oriented to person, place, and time.  Psychiatric:        Mood and Affect: Mood normal.     Ortho Exam  Specialty Comments:  No specialty comments available.  Imaging: No results found.   PMFS History: Patient Active Problem List   Diagnosis Date Noted  . History of colon polyps 07/27/2018  . Pain in right foot 02/24/2018  . Unilateral primary osteoarthritis, left knee 02/24/2018  . Essential hypertension 07/22/2017  . Melasma 04/23/2016  . Left ACL tear 03/07/2016  . Dysplasia of cervix, low grade (CIN 1) 09/21/2015  . H/O LEEP 09/21/2015  . 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 Generalized anxiety disorder 05/07/2015  . Migraine without status migrainosus, not intractable 05/07/2015  . Back injury 11/23/2013    Class: Acute   Past Medical History:  Diagnosis Date  . Abnormal Pap smear 8/13   8/13 ASCUS +HPV, 8/14 ASCUS HPV not detected, 06-26-14 ASCUS +HPV HR  . Anemia    WITH PREGNANCY  . Anxiety   . Arthritis    LEFT KNEE  . Hypertension   . Insomnia   . Migraine headache    with aura (visual changes)    Family History  Problem Relation  Age of Onset  . Hypertension Mother   . Hypertension Father   . Breast cancer Sister 4  . Breast cancer Paternal Aunt 75       died of pancreatic cancer age 76    Past Surgical History:  Procedure Laterality Date  . ANKLE FRACTURE SURGERY Right 08/2019  . APPENDECTOMY  2005  . ARTHROSCOPY WITH ANTERIOR CRUCIATE LIGAMENT (ACL) REPAIR WITH ANTERIOR TIBILIAS GRAFT  03/07/2016    Dr. Len Childs  . BREAST CYST EXCISION Left 6/13  . 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 DILATATION & CURETTAGE/HYSTEROSCOPY WITH MYOSURE N/A 09/15/2017   Procedure: DILATATION & CURETTAGE/HYSTEROSCOPY,WITH ROLLERBALL ABLATION;  Surgeon: Megan Salon, MD;  Location: [facility];  Service: Gynecology;  Laterality: N/A;  .  TUBAL LIGATION  1998   BTL   Social History   Occupational History  . Not on file  Tobacco Use  . Smoking status: Never Smoker  . Smokeless tobacco: Never Used  Substance and Sexual Activity  . Alcohol use: No  . Drug use: No  . Sexual activity: Yes    Partners: Male    Birth control/protection: Surgical    Comment: BTL

## 2020-03-22 ENCOUNTER — Telehealth: Payer: Self-pay | Admitting: Radiology

## 2020-03-22 ENCOUNTER — Other Ambulatory Visit: Payer: Self-pay | Admitting: Radiology

## 2020-03-22 DIAGNOSIS — M4807 Spinal stenosis, lumbosacral region: Secondary | ICD-10-CM

## 2020-03-22 DIAGNOSIS — M5136 Other intervertebral disc degeneration, lumbar region: Secondary | ICD-10-CM

## 2020-03-22 NOTE — Telephone Encounter (Signed)
Called in valium Rx

## 2020-04-03 ENCOUNTER — Other Ambulatory Visit: Payer: Self-pay

## 2020-04-03 ENCOUNTER — Telehealth: Payer: Self-pay | Admitting: Physical Medicine and Rehabilitation

## 2020-04-03 ENCOUNTER — Ambulatory Visit (INDEPENDENT_AMBULATORY_CARE_PROVIDER_SITE_OTHER): Payer: BC Managed Care – PPO | Admitting: Physical Medicine and Rehabilitation

## 2020-04-03 ENCOUNTER — Encounter: Payer: Self-pay | Admitting: Physical Medicine and Rehabilitation

## 2020-04-03 VITALS — BP 113/82 | HR 74

## 2020-04-03 DIAGNOSIS — M48061 Spinal stenosis, lumbar region without neurogenic claudication: Secondary | ICD-10-CM

## 2020-04-03 DIAGNOSIS — M545 Low back pain, unspecified: Secondary | ICD-10-CM

## 2020-04-03 DIAGNOSIS — M47816 Spondylosis without myelopathy or radiculopathy, lumbar region: Secondary | ICD-10-CM | POA: Diagnosis not present

## 2020-04-03 DIAGNOSIS — F411 Generalized anxiety disorder: Secondary | ICD-10-CM

## 2020-04-03 DIAGNOSIS — G8929 Other chronic pain: Secondary | ICD-10-CM

## 2020-04-03 NOTE — Telephone Encounter (Signed)
Is auth needed for [id number] and [phone]? Scheduled for 6/3 at 1415.

## 2020-04-03 NOTE — Progress Notes (Signed)
Numeric Pain Rating Scale and Functional Assessment Average Pain (6) Pain Right Now (7) My pain is in the lower back with no leg pain Pain is worse with: standing, walking and laying in bed on side too long Pain improves with: stretches and ibuprofen helps sometimes. Prednisone pack only helped while taking it.   In the last MONTH (on 0-10 scale) has pain interfered with the following?  1. General activity like being  able to carry out your everyday physical activities such as walking, climbing stairs, carrying groceries, or moving a chair?  Rating(8)  2. Relation with others like being able to carry out your usual social activities and roles such as  activities at home, at work and in your community. Rating(8)  3. Enjoyment of life such that you have  been bothered by emotional problems such as feeling anxious, depressed or irritable?  Rating(8)

## 2020-04-10 NOTE — Telephone Encounter (Signed)
Per [employer] online portal no pa is needed for [id number] and [phone]

## 2020-04-12 ENCOUNTER — Ambulatory Visit: Payer: Self-pay

## 2020-04-12 ENCOUNTER — Encounter: Payer: Self-pay | Admitting: Physical Medicine and Rehabilitation

## 2020-04-12 ENCOUNTER — Ambulatory Visit (INDEPENDENT_AMBULATORY_CARE_PROVIDER_SITE_OTHER): Payer: BC Managed Care – PPO | Admitting: Physical Medicine and Rehabilitation

## 2020-04-12 ENCOUNTER — Telehealth: Payer: Self-pay | Admitting: Physical Medicine and Rehabilitation

## 2020-04-12 ENCOUNTER — Other Ambulatory Visit: Payer: Self-pay

## 2020-04-12 VITALS — BP 127/90 | HR 79

## 2020-04-12 DIAGNOSIS — M47816 Spondylosis without myelopathy or radiculopathy, lumbar region: Secondary | ICD-10-CM | POA: Diagnosis not present

## 2020-04-12 MED ORDER — METHYLPREDNISOLONE ACETATE 80 MG/ML IJ SUSP
20.0000 mg | Freq: Once | INTRAMUSCULAR | Status: AC
Start: 2020-04-12 — End: 2020-04-12
  Administered 2020-04-12: 20 mg

## 2020-04-12 MED ORDER — BUPIVACAINE HCL 0.5 % IJ SOLN
3.0000 mL | Freq: Once | INTRAMUSCULAR | Status: AC
  Administered 2020-04-12: 3 mL

## 2020-04-12 NOTE — Progress Notes (Signed)
Michaela Hanson - 54 y.o. female MRN [id number]  Date of birth: [birth year]  Office Visit Note: Visit Date: 04/12/2020 PCP: Clide Dales, PA Referred by: Clide Dales, *  Subjective: Chief Complaint  Patient presents with  . Lower Back - Pain   HPI:  Michaela Hanson is a 54 y.o. female who comes in today for planned Bilateral L3-L4 and L4-L5 lumbar medial branch block with fluoroscopic guidance.  The patient has failed conservative care including home exercise, medications, time and activity modification.  This injection will be diagnostic and hopefully therapeutic.  Please see requesting physician notes for further details and justification.  Exam shows concordant low back pain with facet joint loading and extension.  Patient is very anxious still today Michaela Hanson bad experience she had with blind epidural injection performed by a neurologist in [place].  We have given her preprocedure Valium.  ROS Otherwise per HPI.  Assessment & Plan: Visit Diagnoses:  1. Spondylosis without myelopathy or radiculopathy, lumbar region     Plan: No additional findings.   Meds & Orders:  Meds ordered this encounter  Medications  . bupivacaine (MARCAINE) 0.5 % (with pres) injection 3 mL  . methylPREDNISolone acetate (DEPO-MEDROL) injection 20 mg    Orders Placed This Encounter  Procedures  . Facet Injection  . XR C-ARM NO REPORT    Follow-up: Return for Review Pain Diary.   Procedures: No procedures performed  Lumbar Diagnostic Facet Joint Nerve Block with Fluoroscopic Guidance   Patient: Michaela Hanson      Date of Birth: 11-18-1965 MRN: [id number] PCP: Clide Dales, PA      Visit Date: 04/12/2020   Universal Protocol:    Date/Time: 04/12/2109:09 PM  Consent Given By: the patient  Position: PRONE  Additional Comments: Vital signs were monitored before and after the procedure. Patient was prepped and draped in the usual sterile fashion. The correct  patient, procedure, and site was verified.   Injection Procedure Details:  Procedure Site One Meds Administered:  Meds ordered this encounter  Medications  . bupivacaine (MARCAINE) 0.5 % (with pres) injection 3 mL  . methylPREDNISolone acetate (DEPO-MEDROL) injection 20 mg     Laterality: Bilateral  Location/Site:  L3-L4 L4-L5  Needle size: 22 ga.  Needle type:spinal  Needle Placement: Oblique pedical  Findings:   -Comments: There was excellent flow of contrast along the articular pillars without intravascular flow.  Procedure Details: The fluoroscope beam is vertically oriented in AP and then obliqued 15 to 20 degrees to the ipsilateral side of the desired nerve to achieve the "Scotty dog" appearance.  The skin over the target area of the junction of the superior articulating process and the transverse process (sacral ala if blocking the L5 dorsal rami) was locally anesthetized with a 1 ml volume of 1% Lidocaine without Epinephrine.  The spinal needle was inserted and advanced in a trajectory view down to the target.   After contact with periosteum and negative aspirate for blood and CSF, correct placement without intravascular or epidural spread was confirmed by injecting 0.5 ml. of Isovue-250.  A spot radiograph was obtained of this image.    Next, a 0.5 ml. volume of the injectate described above was injected. The needle was then redirected to the other facet joint nerves mentioned above if needed.  Prior to the procedure, the patient was given a Pain Diary which was completed for baseline measurements.  After the procedure, the patient rated their pain every 30 minutes and will continue  rating at this frequency for a total of 5 hours.  The patient has been asked to complete the Diary and return to [place] by mail, fax or hand delivered as soon as possible.   Additional Comments:  The patient tolerated the procedure well Dressing: 2 x 2 sterile gauze and Band-Aid     Post-procedure details: Patient was observed during the procedure. Post-procedure instructions were reviewed.  Patient left the clinic in stable condition.    Clinical History: MRI LUMBAR SPINE WITHOUT CONTRAST  TECHNIQUE: Multiplanar, multisequence MR imaging of the lumbar spine was performed. No intravenous contrast was administered.  COMPARISON:  None.  FINDINGS: Segmentation:  5 lumbar type vertebrae  Alignment:  Borderline retrolisthesis at L5-S1.  Vertebrae: Degenerative fatty marrow conversion at L4-5. No fracture, discitis, or aggressive bone lesion.  Conus medullaris and cauda equina: Conus extends to the L2 level. Conus and cauda equina appear normal.  Paraspinal and other soft tissues: Negative  Disc levels:  T12- L1: Unremarkable.  L1-L2: Mild degenerative facet spurring on the right.  L2-L3: Mild degenerative facet spurring.  L3-L4: Mild disc narrowing and bulging eccentric to the left foramen. Degenerative facet spurring and ligamentum flavum thickening. The canal and foramina are patent  L4-L5: Facet osteoarthritis with moderate hypertrophy. Disc narrowing and bulging with endplate degeneration. Moderate spinal stenosis from facet hypertrophy and short pedicles. The foramina are patent  L5-S1:Disc narrowing and bulging with right eccentric endplate degeneration. Posterior element hypertrophy. Moderate spinal stenosis with Subarticular recess narrowing that could affect either S1 nerve root. The foramina are patent  IMPRESSION: 1. L4-5 and L5-S1 moderate spinal stenosis from degenerative disease and short pedicles. 2. Facet osteoarthritis at L1-2 and below.   Electronically Signed   By: Monte Fantasia M.D.   On: 03/18/2020 16:24     Objective:  VS:  HT:    WT:   BMI:     BP:127/90  HR:79bpm  TEMP: ( )  RESP:  Physical Exam Constitutional:      General: She is not in acute distress.    Appearance: Normal  appearance. She is obese. She is not ill-appearing.  HENT:     Head: Normocephalic and atraumatic.     Right Ear: External ear normal.     Left Ear: External ear normal.  Eyes:     Extraocular Movements: Extraocular movements intact.  Cardiovascular:     Rate and Rhythm: Normal rate.     Pulses: Normal pulses.  Musculoskeletal:     Right lower leg: No edema.     Left lower leg: No edema.     Comments: Patient has good distal strength with no pain over the greater trochanters.  No clonus or focal weakness.  Concordant pain with facet loading  Skin:    Findings: No erythema, lesion or rash.  Neurological:     General: No focal deficit present.     Mental Status: She is alert and oriented to person, place, and time.     Sensory: No sensory deficit.     Motor: No weakness or abnormal muscle tone.     Coordination: Coordination normal.  Psychiatric:        Mood and Affect: Mood normal.        Behavior: Behavior normal.      Imaging: XR C-ARM NO REPORT  Result Date: 04/12/2020 Please see Notes tab for imaging impression.

## 2020-04-12 NOTE — Telephone Encounter (Signed)
Please Advise

## 2020-04-12 NOTE — Procedures (Signed)
Lumbar Diagnostic Facet Joint Nerve Block with Fluoroscopic Guidance   Patient: Michaela Hanson      Date of Birth: 11-25-1965 MRN: [id number] PCP: Clide Dales, PA      Visit Date: 04/12/2020   Universal Protocol:    Date/Time: 04/12/2109:09 PM  Consent Given By: the patient  Position: PRONE  Additional Comments: Vital signs were monitored before and after the procedure. Patient was prepped and draped in the usual sterile fashion. The correct patient, procedure, and site was verified.   Injection Procedure Details:  Procedure Site One Meds Administered:  Meds ordered this encounter  Medications  . bupivacaine (MARCAINE) 0.5 % (with pres) injection 3 mL  . methylPREDNISolone acetate (DEPO-MEDROL) injection 20 mg     Laterality: Bilateral  Location/Site:  L3-L4 L4-L5  Needle size: 22 ga.  Needle type:spinal  Needle Placement: Oblique pedical  Findings:   -Comments: There was excellent flow of contrast along the articular pillars without intravascular flow.  Procedure Details: The fluoroscope beam is vertically oriented in AP and then obliqued 15 to 20 degrees to the ipsilateral side of the desired nerve to achieve the "Scotty dog" appearance.  The skin over the target area of the junction of the superior articulating process and the transverse process (sacral ala if blocking the L5 dorsal rami) was locally anesthetized with a 1 ml volume of 1% Lidocaine without Epinephrine.  The spinal needle was inserted and advanced in a trajectory view down to the target.   After contact with periosteum and negative aspirate for blood and CSF, correct placement without intravascular or epidural spread was confirmed by injecting 0.5 ml. of Isovue-250.  A spot radiograph was obtained of this image.    Next, a 0.5 ml. volume of the injectate described above was injected. The needle was then redirected to the other facet joint nerves mentioned above if needed.  Prior to the  procedure, the patient was given a Pain Diary which was completed for baseline measurements.  After the procedure, the patient rated their pain every 30 minutes and will continue rating at this frequency for a total of 5 hours.  The patient has been asked to complete the Diary and return to [place] by mail, fax or hand delivered as soon as possible.   Additional Comments:  The patient tolerated the procedure well Dressing: 2 x 2 sterile gauze and Band-Aid    Post-procedure details: Patient was observed during the procedure. Post-procedure instructions were reviewed.  Patient left the clinic in stable condition.

## 2020-04-12 NOTE — Telephone Encounter (Signed)
Called pt and advised.  

## 2020-04-12 NOTE — Progress Notes (Signed)
Pt states pain across the lower back. Pt states no major changes since last visit.   .Numeric Pain Rating Scale and Functional Assessment Average Pain 7   In the last MONTH (on 0-10 scale) has pain interfered with the following?  1. General activity like being  able to carry out your everyday physical activities such as walking, climbing stairs, carrying groceries, or moving a chair?  Rating(8)   +Driver, -BT, -Dye Allergies.

## 2020-04-12 NOTE — Telephone Encounter (Signed)
Not sure what nexprin is? Should be ok to take anything she normally takes that I saw on her list.

## 2020-04-12 NOTE — Telephone Encounter (Signed)
Pt called stating she has an appt on 04/12/20 @ 2:30 and usually takes a valium around 1:30 the pt would like to know if she's allowed to take her nexprin as well?  [phone]

## 2020-05-16 ENCOUNTER — Ambulatory Visit (INDEPENDENT_AMBULATORY_CARE_PROVIDER_SITE_OTHER): Payer: BC Managed Care – PPO | Admitting: Physician Assistant

## 2020-05-16 ENCOUNTER — Other Ambulatory Visit: Payer: Self-pay

## 2020-05-16 ENCOUNTER — Encounter: Payer: Self-pay | Admitting: Physician Assistant

## 2020-05-16 DIAGNOSIS — M4807 Spinal stenosis, lumbosacral region: Secondary | ICD-10-CM | POA: Diagnosis not present

## 2020-05-16 NOTE — Progress Notes (Signed)
Office Visit Note   Patient: Michaela Hanson           Date of Birth: [birth year]           MRN: [id number] Visit Date: 05/16/2020              Requested by: Clide Dales, PA [address] [place],  [place] [address] PCP: Clide Dales, PA   Assessment & Plan: Visit Diagnoses:  1. Spinal stenosis of lumbosacral region     Plan: Handout on back exercises given to patient reviewed.  She will follow up with [place] if her condition becomes worse or on an as-needed basis.  Questions were encouraged and answered  Follow-Up Instructions: No follow-ups on file.   Orders:  No orders of the defined types were placed in this encounter.  No orders of the defined types were placed in this encounter.     Procedures: No procedures performed   Clinical Data: No additional findings.   Subjective: Chief Complaint  Patient presents with  . Lower Back - Follow-up    HPI Michaela Hanson returns today follow-up after bilateral L L3-L4 and L4-5 medial branch blocks by Dr. Alvester Morin.  She states she is better approximately 50%.  She states that she is able to walk further and stand longer since injections.  She only has pain when she first begins to ambulate after sitting.  She states after walking for a while it goes away she states an achy pain about 6-7 out of 10 pain at its worst.  She is having no waking pain.  She is not interested in coming surgical mention.  She states that she can live with the discomfort that she has at this point in her back. Review of Systems See HPI otherwise negative  Objective: Vital Signs: There were no vitals taken for this visit.  Physical Exam General: Well-developed well-nourished female no acute distress Ortho Exam Lower extremities 5 out of 5 strength throughout the lower extremities against resistance.  Negative straight leg raise bilaterally tight hamstrings right greater than left. Specialty Comments:  No specialty comments  available.  Imaging: No results found.   PMFS History: Patient Active Problem List   Diagnosis Date Noted  . History of colon polyps 07/27/2018  . Pain in right foot 02/24/2018  . Unilateral primary osteoarthritis, left knee 02/24/2018  . Essential hypertension 07/22/2017  . Melasma 04/23/2016  . Left ACL tear 03/07/2016  . Dysplasia of cervix, low grade (CIN 1) 09/21/2015  . H/O LEEP 09/21/2015  . 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 Generalized anxiety disorder 05/07/2015  . Migraine without status migrainosus, not intractable 05/07/2015  . Back injury 11/23/2013    Class: Acute   Past Medical History:  Diagnosis Date  . Abnormal Pap smear 8/13   8/13 ASCUS +HPV, 8/14 ASCUS HPV not detected, 06-26-14 ASCUS +HPV HR  . Anemia    WITH PREGNANCY  . Anxiety   . Arthritis    LEFT KNEE  . Hypertension   . Insomnia   . Migraine headache    with aura (visual changes)    Family History  Problem Relation Age of Onset  . Hypertension Mother   . Hypertension Father   . Breast cancer Sister 41  . Breast cancer Paternal Aunt 27       died of pancreatic cancer age 68    Past Surgical History:  Procedure Laterality Date  . ANKLE FRACTURE SURGERY Right 08/2019  .  APPENDECTOMY  2005  . ARTHROSCOPY WITH ANTERIOR CRUCIATE LIGAMENT (ACL) REPAIR WITH ANTERIOR TIBILIAS GRAFT  03/07/2016    Dr. Thamas Jaegers  . BREAST CYST EXCISION Left 6/13  . 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 DILATATION & CURETTAGE/HYSTEROSCOPY WITH MYOSURE N/A 09/15/2017   Procedure: DILATATION & CURETTAGE/HYSTEROSCOPY,WITH ROLLERBALL ABLATION;  Surgeon: Jerene Bears, MD;  Location: [facility];  Service: Gynecology;  Laterality: N/A;  . TUBAL LIGATION  1998   BTL   Social History   Occupational History  . Not on file  Tobacco Use  . Smoking status: Never Smoker  . Smokeless tobacco: Never Used  Vaping Use  . Vaping Use: Never used  Substance and Sexual Activity  . Alcohol  use: No  . Drug use: No  . Sexual activity: Yes    Partners: Male    Birth control/protection: Surgical    Comment: BTL

## 2020-08-06 ENCOUNTER — Telehealth: Payer: Self-pay | Admitting: *Deleted

## 2020-08-06 NOTE — Telephone Encounter (Signed)
Abner Greenspan is not taking any new patients.    Fredric Mare what about [facility].  This was an after hours call.

## 2020-08-06 NOTE — Telephone Encounter (Signed)
Caller Name Rumor Sun Caller Phone Number [phone] Patient Name Michaela Hanson Patient DOB [birth year] Call Type Message Only Information Provided Reason for Call Request to Schedule Office Appointment Initial Comment Caller states she is trying to schedule office appt Additional Comment Caller want to see Dr. Danise Edge or Shanda Bumps Copland. In need of a primary care physician.

## 2020-08-07 NOTE — Telephone Encounter (Signed)
Dr. Patsy Lager is not taking new patients at this time. Not sure if anyone else here could see her?

## 2020-08-07 NOTE — Telephone Encounter (Signed)
Called Ms Seldon. Offered Edward. She declined. I transferred her over to [place] to see if NP Nche would be a better fit for her.

## 2020-08-14 ENCOUNTER — Encounter: Payer: Self-pay | Admitting: Physical Medicine and Rehabilitation

## 2020-08-14 NOTE — Progress Notes (Signed)
Michaela Hanson - 54 y.o. female MRN [id number]  Date of birth: 1966-09-13  Office Visit Note: Visit Date: 04/03/2020 PCP: Clide Dales, PA Referred by: Clide Dales, *  Subjective: Chief Complaint  Patient presents with  . Lower Back - Pain   HPI: Michaela Hanson is a 54 y.o. female who comes in today For evaluation management of chronic worsening low back pain which is quite severe at times at the request of Rexene Edison, P.A.-C.  She reports axial low back pain worse with standing and walking but also laying in the bed on her side for too long.  Some pain complaints of the greater trochanters bilaterally.  She has no leg pain down the leg.  No paresthesias.  She rates her pain as a 6 out of 10 despite medication management etc.  Her case is problematic with obesity as well as migraine headache and chronic pain.  She reports significant anxiety of having any sorts of injections in the spine do to the fact that she had a blind epidural performed by a neurologist in [place] and just had a very bad experience from this.  She has had MRI of the lumbar spine and this is reviewed with her today using spine models and imaging.  She does have moderate stenosis but she also has multilevel facet arthropathy which is concomitant usually with the stenosis.  Again no radicular leg pain or neurogenic claudication symptoms.  No focal weakness no red flag complaints.  She has had good care through Rexene Edison, P.A.-C with physical therapy and medication management.  She has had no history of lumbar spine surgery.  She has significant anxiety about injections.  Review of Systems  Musculoskeletal: Positive for back pain.  All other systems reviewed and are negative.  Otherwise per HPI.  Assessment & Plan: Visit Diagnoses:  1. Spondylosis without myelopathy or radiculopathy, lumbar region   2. Chronic bilateral low back pain without sciatica   3. Spinal stenosis of lumbar region without  neurogenic claudication   4. Pre-operative anxiety     Plan: Findings:  Chronic worsening severe axial low back pain worse with standing and clinically on exam consistent with facet mediated low back pain.  I do not think the stenosis is really causing her symptoms at this point but we cannot rule that out totally.  She has no radicular complaints.  I do think she would benefit from diagnostic facet blocks at L4-5 and L5-S1.  If those are beneficial could look at radiofrequency ablation but she is very nervous about injections in general.  We went over long discussion of the safety of those and how we would do that different with fluoroscopic guidance.  I also gave her preprocedure Valium so that we can have some anxiety control.  She does agree to do this and we will complete diagnostic blocks.  If she does well with that again would look at double block paradigm and ablation.  If she does not do well we would entertain the idea of one-time epidural injection.    Meds & Orders: No orders of the defined types were placed in this encounter.  No orders of the defined types were placed in this encounter.   Follow-up: Return for Medial branch blocks at L4-5 and L5-S1.   Procedures: No procedures performed  No notes on file   Clinical History: MRI LUMBAR SPINE WITHOUT CONTRAST  TECHNIQUE: Multiplanar, multisequence MR imaging of the lumbar spine was performed. No intravenous contrast was  administered.  COMPARISON:  None.  FINDINGS: Segmentation:  5 lumbar type vertebrae  Alignment:  Borderline retrolisthesis at L5-S1.  Vertebrae: Degenerative fatty marrow conversion at L4-5. No fracture, discitis, or aggressive bone lesion.  Conus medullaris and cauda equina: Conus extends to the L2 level. Conus and cauda equina appear normal.  Paraspinal and other soft tissues: Negative  Disc levels:  T12- L1: Unremarkable.  L1-L2: Mild degenerative facet spurring on the  right.  L2-L3: Mild degenerative facet spurring.  L3-L4: Mild disc narrowing and bulging eccentric to the left foramen. Degenerative facet spurring and ligamentum flavum thickening. The canal and foramina are patent  L4-L5: Facet osteoarthritis with moderate hypertrophy. Disc narrowing and bulging with endplate degeneration. Moderate spinal stenosis from facet hypertrophy and short pedicles. The foramina are patent  L5-S1:Disc narrowing and bulging with right eccentric endplate degeneration. Posterior element hypertrophy. Moderate spinal stenosis with Subarticular recess narrowing that could affect either S1 nerve root. The foramina are patent  IMPRESSION: 1. L4-5 and L5-S1 moderate spinal stenosis from degenerative disease and short pedicles. 2. Facet osteoarthritis at L1-2 and below.   Electronically Signed   By: Marnee Spring M.D.   On: 03/18/2020 16:24   She reports that she has never smoked. She has never used smokeless tobacco.  Recent Labs    11/28/19 1124  HGBA1C 5.7*    Objective:  VS:  HT:    WT:   BMI:     BP:113/82  HR:74bpm  TEMP: ( )  RESP:  Physical Exam Constitutional:      General: She is not in acute distress.    Appearance: Normal appearance. She is obese. She is not ill-appearing.  HENT:     Head: Normocephalic and atraumatic.     Right Ear: External ear normal.     Left Ear: External ear normal.  Eyes:     Extraocular Movements: Extraocular movements intact.  Cardiovascular:     Rate and Rhythm: Normal rate.     Pulses: Normal pulses.  Musculoskeletal:     Right lower leg: No edema.     Left lower leg: No edema.     Comments: Patient has good distal strength with no pain over the greater trochanters.  No clonus or focal weakness.Patient somewhat slow to rise from a seated position to full extension.  There is concordant low back pain with facet loading and lumbar spine extension rotation.  There are no definitive trigger points  but the patient is somewhat tender across the lower back and PSIS.  There is no pain with hip rotation.   Skin:    Findings: No erythema, lesion or rash.  Neurological:     General: No focal deficit present.     Mental Status: She is alert and oriented to person, place, and time.     Sensory: No sensory deficit.     Motor: No weakness or abnormal muscle tone.     Coordination: Coordination normal.  Psychiatric:        Mood and Affect: Mood normal.        Behavior: Behavior normal.     Ortho Exam  Imaging: No results found.  Past Medical/Family/Surgical/Social History: Medications & Allergies reviewed per EMR, new medications updated. Patient Active Problem List   Diagnosis Date Noted  . History of colon polyps 07/27/2018  . Pain in right foot 02/24/2018  . Unilateral primary osteoarthritis, left knee 02/24/2018  . Essential hypertension 07/22/2017  . Melasma 04/23/2016  . Left ACL tear 03/07/2016  .  Dysplasia of cervix, low grade (CIN 1) 09/21/2015  . H/O LEEP 09/21/2015  . 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 Generalized anxiety disorder 05/07/2015  . Migraine without status migrainosus, not intractable 05/07/2015  . Back injury 11/23/2013    Class: Acute   Past Medical History:  Diagnosis Date  . Abnormal Pap smear 8/13   8/13 ASCUS +HPV, 8/14 ASCUS HPV not detected, 06-26-14 ASCUS +HPV HR  . Anemia    WITH PREGNANCY  . Anxiety   . Arthritis    LEFT KNEE  . Hypertension   . Insomnia   . Migraine headache    with aura (visual changes)   Family History  Problem Relation Age of Onset  . Hypertension Mother   . Hypertension Father   . Breast cancer Sister 60  . Breast cancer Paternal Aunt 75       died of pancreatic cancer age 90   Past Surgical History:  Procedure Laterality Date  . ANKLE FRACTURE SURGERY Right 08/2019  . APPENDECTOMY  2005  . ARTHROSCOPY WITH ANTERIOR CRUCIATE LIGAMENT (ACL) REPAIR WITH ANTERIOR TIBILIAS GRAFT  03/07/2016     Dr. Thamas Jaegers  . BREAST CYST EXCISION Left 6/13  . 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 DILATATION & CURETTAGE/HYSTEROSCOPY WITH MYOSURE N/A 09/15/2017   Procedure: DILATATION & CURETTAGE/HYSTEROSCOPY,WITH ROLLERBALL ABLATION;  Surgeon: Jerene Bears, MD;  Location: [facility];  Service: Gynecology;  Laterality: N/A;  . TUBAL LIGATION  1998   BTL   Social History   Occupational History  . Not on file  Tobacco Use  . Smoking status: Never Smoker  . Smokeless tobacco: Never Used  Vaping Use  . Vaping Use: Never used  Substance and Sexual Activity  . Alcohol use: No  . Drug use: No  . Sexual activity: Yes    Partners: Male    Birth control/protection: Surgical    Comment: BTL

## 2020-10-19 ENCOUNTER — Ambulatory Visit (INDEPENDENT_AMBULATORY_CARE_PROVIDER_SITE_OTHER): Payer: BC Managed Care – PPO | Admitting: Nurse Practitioner

## 2020-10-19 ENCOUNTER — Other Ambulatory Visit: Payer: Self-pay

## 2020-10-19 ENCOUNTER — Encounter: Payer: Self-pay | Admitting: Nurse Practitioner

## 2020-10-19 ENCOUNTER — Ambulatory Visit: Payer: BC Managed Care – PPO | Admitting: Nurse Practitioner

## 2020-10-19 VITALS — BP 120/76 | HR 68 | Resp 16 | Wt 255.0 lb

## 2020-10-19 DIAGNOSIS — N952 Postmenopausal atrophic vaginitis: Secondary | ICD-10-CM

## 2020-10-19 MED ORDER — ESTRADIOL 0.1 MG/GM VA CREA: TOPICAL_CREAM | VAGINAL | 1 refills | Status: DC

## 2020-10-19 NOTE — Progress Notes (Signed)
GYNECOLOGY  VISIT  CC:   Vaginal odor  HPI: 54 y.o. [id number] Married [place] or [place] American female here for vaginal odor. Has noticed odor and irritation x 2 weeks. She can tell something not normal. Has had BV in past and seem like that might be starting but the odor is different (not as bad) Declines STD testing Stopped using vaginal estrogen 4-5 months ago, it stopped working. Feels dry.     GYNECOLOGIC HISTORY: No LMP recorded. Patient has had an ablation. Contraception: ablation, BTL Menopausal hormone therapy: none  Patient Active Problem List   Diagnosis Date Noted  . History of colon polyps 07/27/2018  . Pain in right foot 02/24/2018  . Unilateral primary osteoarthritis, left knee 02/24/2018  . Essential hypertension 07/22/2017  . Melasma 04/23/2016  . Left ACL tear 03/07/2016  . Dysplasia of cervix, low grade (CIN 1) 09/21/2015  . H/O LEEP 09/21/2015  . 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 Generalized anxiety disorder 05/07/2015  . Migraine without status migrainosus, not intractable 05/07/2015  . Back injury 11/23/2013    Class: Acute    Past Medical History:  Diagnosis Date  . Abnormal Pap smear 8/13   8/13 ASCUS +HPV, 8/14 ASCUS HPV not detected, 06-26-14 ASCUS +HPV HR  . Anemia    WITH PREGNANCY  . Anxiety   . Arthritis    LEFT KNEE  . Hypertension   . Insomnia   . Migraine headache    with aura (visual changes)    Past Surgical History:  Procedure Laterality Date  . ANKLE FRACTURE SURGERY Right 08/2019  . APPENDECTOMY  2005  . ARTHROSCOPY WITH ANTERIOR CRUCIATE LIGAMENT (ACL) REPAIR WITH ANTERIOR TIBILIAS GRAFT  03/07/2016    Dr. Thamas Jaegers  . BREAST CYST EXCISION Left 6/13  . 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 DILATATION & CURETTAGE/HYSTEROSCOPY WITH MYOSURE N/A 09/15/2017   Procedure: DILATATION & CURETTAGE/HYSTEROSCOPY,WITH ROLLERBALL ABLATION;  Surgeon: Jerene Bears, MD;  Location: [facility];  Service:  Gynecology;  Laterality: N/A;  . TUBAL LIGATION  1998   BTL    MEDS:   Current Outpatient Medications on File Prior to Visit  Medication Sig Dispense Refill  . clobetasol ointment (TEMOVATE) 0.05 % Apply topically 2 (two) times daily. Do not use for more than 7 days in a row. 30 g 1  . ergocalciferol (VITAMIN D2) 1.25 MG (50000 UT) capsule Take by mouth once a week.    . ferrous sulfate 325 (65 FE) MG tablet Take by mouth.    . hydrochlorothiazide (HYDRODIURIL) 25 MG tablet Take 1 tablet by mouth daily. Takes 1/2    . hydroquinone 4 % cream Apply to affected areas on face nightly for 3 months, then go to every other days for 3 months    . levocetirizine (XYZAL) 5 MG tablet Take by mouth.    . meloxicam (MOBIC) 15 MG tablet Take by mouth.    . Multiple Vitamins-Minerals (MULTIVITAMIN PO) Take by mouth daily.     No current facility-administered medications on file prior to visit.    ALLERGIES: Flagyl [metronidazole]  Family History  Problem Relation Age of Onset  . Hypertension Mother   . Hypertension Father   . Breast cancer Sister 63  . Breast cancer Paternal Aunt 6       died of pancreatic cancer age 23      Review of Systems  Constitutional: Negative.   HENT: Negative.   Eyes: Negative.  Respiratory: Negative.   Cardiovascular: Negative.   Gastrointestinal: Negative.   Endocrine: Negative.   Genitourinary:       Vaginal odor & slight itching  Musculoskeletal: Negative.   Skin: Negative.   Allergic/Immunologic: Negative.   Neurological: Negative.   Hematological: Negative.   Psychiatric/Behavioral: Negative.     PHYSICAL EXAMINATION:    BP 120/76   Pulse 68   Resp 16   Wt 255 lb (115.7 kg)   BMI 38.77 kg/m     General appearance: alert, cooperative and appears stated age Lymph:  no inguinal LAD noted  Pelvic: External genitalia:  no lesions              Urethra:  normal appearing urethra with no masses, tenderness or lesions              Bartholins  and Skenes: normal                 Vagina: atrophy, thin yellowish discharge              Cervix: no cervical motion tenderness and no lesions              Bimanual Exam:  Uterus:  normal size, contour, position, consistency, mobility, non-tender              Adnexa: no mass, fullness, tenderness                Wet mount: ph 5, 1-3 (few) clue, neg whiff, neg yeast, 10 WBC per field  Chaperone, Joy, CMA, was present for exam.  Assessment: Vaginal irritation Atrophic Vaginitis  Plan: Affirm sent  Estrace Vaginal, daily x 2 weeks, then twice weekly F/u for annual exam after 11/27/2020

## 2020-10-19 NOTE — Patient Instructions (Signed)
May try vaginal moisturizer like Replens Also, some women have success with Olive oil or coconut oil For now, use Estrace once daily x 2 weeks, then twice weekly until your annual exam  Atrophic Vaginitis  Atrophic vaginitis is a condition in which the tissues that line the vagina become dry and thin. This condition is most common in women who have stopped having regular menstrual periods (are in menopause). This usually starts when a woman is 76-40 years old. That is the time when a woman's estrogen levels begin to drop (decrease). Estrogen is a female hormone. It helps to keep the tissues of the vagina moist. It stimulates the vagina to produce a clear fluid that lubricates the vagina for sexual intercourse. This fluid also protects the vagina from infection. Lack of estrogen can cause the lining of the vagina to get thinner and dryer. The vagina may also shrink in size. It may become less elastic. Atrophic vaginitis tends to get worse over time as a woman's estrogen level drops. What are the causes? This condition is caused by the normal drop in estrogen that happens around the time of menopause. What increases the risk? Certain conditions or situations may lower a woman's estrogen level, leading to a higher risk for atrophic vaginitis. You are more likely to develop this condition if:  You are taking medicines that block estrogen.  You have had your ovaries removed.  You are being treated for cancer with X-ray (radiation) or medicines (chemotherapy).  You have given birth or are breastfeeding.  You are older than age 15.  You smoke. What are the signs or symptoms? Symptoms of this condition include:  Pain, soreness, or bleeding during sexual intercourse (dyspareunia).  Vaginal burning, irritation, or itching.  Pain or bleeding when a speculum is used in a vaginal exam (pelvic exam).  Having burning pain when passing urine.  Vaginal discharge that is brown or yellow. In some  cases, there are no symptoms. How is this diagnosed? This condition is diagnosed by taking a medical history and doing a physical exam. This will include a pelvic exam that checks the vaginal tissues. Though rare, you may also have other tests, including:  A urine test.  A test that checks the acid balance in your vagina (acid balance test). How is this treated? Treatment for this condition depends on how severe your symptoms are. Treatment may include:  Using an over-the-counter vaginal lubricant before sex.  Using a long-acting vaginal moisturizer.  Using low-dose vaginal estrogen for moderate to severe symptoms that do not respond to other treatments. Options include creams, tablets, and inserts (vaginal rings). Before you use a vaginal estrogen, tell your health care provider if you have a history of: ? Breast cancer. ? Endometrial cancer. ? Blood clots. If you are not sexually active and your symptoms are very mild, you may not need treatment. Follow these instructions at home: Medicines  Take over-the-counter and prescription medicines only as told by your health care provider. Do not use herbal or alternative medicines unless your health care provider says that you can.  Use over-the-counter creams, lubricants, or moisturizers for dryness only as directed by your health care provider. General instructions  If your atrophic vaginitis is caused by menopause, discuss all of your menopause symptoms and treatment options with your health care provider.  Do not douche.  Do not use products that can make your vagina dry. These include: ? Scented feminine sprays. ? Scented tampons. ? Scented soaps.  Vaginal intercourse  can help to improve blood flow and elasticity of vaginal tissue. If it hurts to have sex, try using a lubricant or moisturizer just before having intercourse. Contact a health care provider if:  Your discharge looks different than normal.  Your vagina has an  unusual smell.  You have new symptoms.  Your symptoms do not improve with treatment.  Your symptoms get worse. Summary  Atrophic vaginitis is a condition in which the tissues that line the vagina become dry and thin. It is most common in women who have stopped having regular menstrual periods (are in menopause).  Treatment options include using vaginal lubricants and low-dose vaginal estrogen.  Contact a health care provider if your vagina has an unusual smell, or if your symptoms get worse or do not improve after treatment. This information is not intended to replace advice given to you by your health care provider. Make sure you discuss any questions you have with your health care provider. Document Revised: 10/09/2017 Document Reviewed: 07/23/2017 Elsevier Patient Education  2020 [employer].

## 2020-10-20 LAB — VAGINITIS/VAGINOSIS, DNA PROBE
Candida Species: NEGATIVE
Gardnerella vaginalis: POSITIVE — AB
Trichomonas vaginosis: NEGATIVE

## 2020-10-22 ENCOUNTER — Other Ambulatory Visit: Payer: Self-pay | Admitting: Nurse Practitioner

## 2020-10-22 ENCOUNTER — Telehealth: Payer: Self-pay

## 2020-10-22 DIAGNOSIS — N760 Acute vaginitis: Secondary | ICD-10-CM

## 2020-10-22 DIAGNOSIS — B9689 Other specified bacterial agents as the cause of diseases classified elsewhere: Secondary | ICD-10-CM

## 2020-10-22 MED ORDER — METRONIDAZOLE 0.75 % VA GEL: 1.0000 | Freq: Every day | VAGINAL | 0 refills | Status: AC

## 2020-10-22 NOTE — Progress Notes (Signed)
Spoke with patient, wants Metrogel for BV. She has noticed the odor has gotten worse she she was tested.

## 2020-10-22 NOTE — Telephone Encounter (Signed)
Patient is calling for a prescription after receiving results.

## 2020-10-22 NOTE — Telephone Encounter (Signed)
Routing to [place], NP for results. Please advise   +BV on 10/19/20

## 2020-10-23 NOTE — Telephone Encounter (Signed)
Clarita Crane, NP  10/22/2020 5:26 PM EST      I called patient already because she has Metronidazole listed as an allergy and before I sent her RX, I wanted to verify what she prefers to use.     Encounter closed  Rx sent to pharmacy on 10/22/20.

## 2020-12-05 ENCOUNTER — Encounter: Payer: Self-pay | Admitting: Obstetrics & Gynecology

## 2020-12-05 ENCOUNTER — Ambulatory Visit (INDEPENDENT_AMBULATORY_CARE_PROVIDER_SITE_OTHER): Payer: BC Managed Care – PPO | Admitting: Obstetrics & Gynecology

## 2020-12-05 ENCOUNTER — Other Ambulatory Visit: Payer: Self-pay

## 2020-12-05 VITALS — BP 119/83 | HR 86 | Ht 68.0 in | Wt 255.0 lb

## 2020-12-05 DIAGNOSIS — Z860100 Personal history of colon polyps, unspecified: Secondary | ICD-10-CM

## 2020-12-05 DIAGNOSIS — I10 Essential (primary) hypertension: Secondary | ICD-10-CM

## 2020-12-05 DIAGNOSIS — Z01419 Encounter for gynecological examination (general) (routine) without abnormal findings: Secondary | ICD-10-CM

## 2020-12-05 DIAGNOSIS — N952 Postmenopausal atrophic vaginitis: Secondary | ICD-10-CM | POA: Diagnosis not present

## 2020-12-05 DIAGNOSIS — Z9889 Other specified postprocedural states: Secondary | ICD-10-CM | POA: Diagnosis not present

## 2020-12-05 DIAGNOSIS — Z6838 Body mass index (BMI) 38.0-38.9, adult: Secondary | ICD-10-CM

## 2020-12-05 NOTE — Progress Notes (Signed)
55 y.o. G58P2002 Married [place] American female here for annual exam.  Denies vaginal bleeding.  Broke ankle in 2020.  Still not released but it is much better.  Was tutoring prior to fracture.    No LMP recorded. Patient has had an ablation.          Sexually active: Yes.    The current method of family planning is post menopausal status.    Exercising: not yet due to ankle fracture Smoker:  no  Health Maintenance: Pap:  2019 neg with neg HR HPV, 11/28/2019 pap neg per pt History of abnormal Pap:  Yes, +HR HPV 10/16 MMG:  06/26/2020 at [facility], in Care Everywhere Colonoscopy:  06/2018, adenomatous polyp, repeat 5 years BMD:   n/a TDaP:  2016 Pneumonia vaccine(s):  n/a Shingrix:   declines Hep C testing:  Screening Labs: 09/2020   reports that she has never smoked. She has never used smokeless tobacco. She reports that she does not drink alcohol and does not use drugs.  Past Medical History:  Diagnosis Date  . Abnormal Pap smear 8/13   8/13 ASCUS +HPV, 8/14 ASCUS HPV not detected, 06-26-14 ASCUS +HPV HR  . Anemia    WITH PREGNANCY  . Anxiety   . Arthritis    LEFT KNEE  . Hypertension   . Insomnia   . Migraine headache    with aura (visual changes)    Past Surgical History:  Procedure Laterality Date  . ANKLE FRACTURE SURGERY Right 08/2019  . APPENDECTOMY  2005  . ARTHROSCOPY WITH ANTERIOR CRUCIATE LIGAMENT (ACL) REPAIR WITH ANTERIOR TIBILIAS GRAFT  03/07/2016    Dr. Thamas Jaegers  . BREAST CYST EXCISION Left 6/13  . 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 DILATATION & CURETTAGE/HYSTEROSCOPY WITH MYOSURE N/A 09/15/2017   Procedure: DILATATION & CURETTAGE/HYSTEROSCOPY,WITH ROLLERBALL ABLATION;  Surgeon: Jerene Bears, MD;  Location: [facility];  Service: Gynecology;  Laterality: N/A;  . TUBAL LIGATION  1998   BTL    Current Outpatient Medications  Medication Sig Dispense Refill  . clobetasol ointment (TEMOVATE) 0.05 % Apply topically 2 (two) times daily.  Do not use for more than 7 days in a row. 30 g 1  . ergocalciferol (VITAMIN D2) 1.25 MG (50000 UT) capsule Take by mouth once a week.    . estradiol (ESTRACE) 0.1 MG/GM vaginal cream 1 gram vaginally daily x 2 weeks, then use twice weekly 42.5 g 1  . ferrous sulfate 325 (65 FE) MG tablet Take by mouth.    . hydrochlorothiazide (HYDRODIURIL) 25 MG tablet Take 25 mg by mouth daily. Takes 1/2 12.5mg     . levocetirizine (XYZAL) 5 MG tablet Take by mouth.    . meloxicam (MOBIC) 15 MG tablet Take by mouth.    . Multiple Vitamins-Minerals (MULTIVITAMIN PO) Take by mouth daily.    . hydroquinone 4 % cream Apply to affected areas on face nightly for 3 months, then go to every other days for 3 months     No current facility-administered medications for this visit.    Family History  Problem Relation Age of Onset  . Hypertension Mother   . Hypertension Father   . Breast cancer Sister 30  . Breast cancer Paternal Aunt 53       died of pancreatic cancer age 14    Review of Systems  All other systems reviewed and are negative.   Exam:   BP 119/83   Pulse 86  Ht 5\' 8"  (1.727 m)   Wt 255 lb (115.7 kg)   BMI 38.77 kg/m   Height: 5\' 8"  (172.7 cm)  General appearance: alert, cooperative and appears stated age Head: Normocephalic, without obvious abnormality, atraumatic Neck: no adenopathy, supple, symmetrical, trachea midline and thyroid normal to inspection and palpation Lungs: clear to auscultation bilaterally Breasts: normal appearance, no masses or tenderness Heart: regular rate and rhythm Abdomen: soft, non-tender; bowel sounds normal; no masses,  no organomegaly Extremities: extremities normal, atraumatic, no cyanosis or edema Skin: Skin color, texture, turgor normal. No rashes or lesions Lymph nodes: Cervical, supraclavicular, and axillary nodes normal. No abnormal inguinal nodes palpated Neurologic: Grossly normal   Pelvic: External genitalia:  no lesions              Urethra:   normal appearing urethra with no masses, tenderness or lesions              Bartholins and Skenes: normal                 Vagina: normal appearing vagina with normal color and discharge, no lesions              Cervix: no lesions              Pap taken: No. Bimanual Exam:  Uterus:  normal size, contour, position, consistency, mobility, non-tender              Adnexa: normal adnexa and no mass, fullness, tenderness               Rectovaginal: Confirms               Anus:  normal sphincter tone, no lesions  Chaperone, , CMA, was present for exam.  Assessment/Plan: 1. Well woman exam with routine gynecological exam - will plan pap new year - MMG due in the summer - colonoscopy due 2024 - lab work with PCP  2. Atrophic vaginitis - does not need RF for estrogen cream at this time.  She will let me know when needed.  3. H/O LEEP  4. Essential hypertension  5. History of colon polyps  6.  BMI 38 - referral to healthy weight and wellness

## 2020-12-17 ENCOUNTER — Ambulatory Visit (INDEPENDENT_AMBULATORY_CARE_PROVIDER_SITE_OTHER): Payer: BC Managed Care – PPO | Admitting: Orthopaedic Surgery

## 2020-12-17 ENCOUNTER — Encounter: Payer: Self-pay | Admitting: Orthopaedic Surgery

## 2020-12-17 DIAGNOSIS — M7062 Trochanteric bursitis, left hip: Secondary | ICD-10-CM

## 2020-12-17 MED ORDER — METHYLPREDNISOLONE ACETATE 40 MG/ML IJ SUSP
40.0000 mg | INTRAMUSCULAR | Status: AC | PRN
  Administered 2020-12-17: 40 mg via INTRA_ARTICULAR

## 2020-12-17 MED ORDER — LIDOCAINE HCL 1 % IJ SOLN
3.0000 mL | INTRAMUSCULAR | Status: AC | PRN
  Administered 2020-12-17: 3 mL

## 2020-12-17 NOTE — Progress Notes (Signed)
HPI: [name] comes in today for left knee pain.  She was last seen for her knee in March 2021 and had back pain at that time also she was placed on prednisone Dosepak and her knee pain went away.  She notes that she began having knee pain around Christmas saw her PCP placed her on meloxicam she has been on this for about 3 weeks with no relief.  Denies any injury to the knee.  She paints with a bra described with her hand over the lateral aspect of her hip down to her knee as source of her pain.  She is having no numbness tingling.  She did have radiographs her primary care physician's office on the able to see the films but they read it is mild arthritic changes involving the medial compartment.  Prior films done in our office are read as mild to moderate arthritis involving medial compartment and mild patellofemoral arthritic changes.  She is having no mechanical symptoms of the knee.  Review of systems: Please see HPI otherwise negative  Physical exam: Left knee full extension with slight hyperextension.  Full flexion without pain.  No abnormal warmth erythema or effusion left knee.  Good range of motion the right knee without pain.  Nontender along the medial joint line.  Tenderness over the lateral joint line and of the IT band maximal tenderness is over the left hip trochanteric region.  She has excellent range of motion of the left hip without pain.  Impression: IT band syndrome left extremity  Plan: She shown IT band stretching exercises.  Discussed with her performing a trochanteric injection she was agreeable with this.  She tolerated the injection well.  We will see her back in 2 weeks if she continues to have pain.  Questions were encouraged and answered at length.  Procedure Note  Patient: Michaela Hanson             Date of Birth: 14-May-1966           MRN: [id number]             Visit Date: 12/17/2020  Procedures: Visit Diagnoses:  1. Trochanteric bursitis, left hip     Large  Joint Inj: L greater trochanter on 12/17/2020 11:26 AM Indications: pain Details: 22 G 1.5 in needle, lateral approach  Arthrogram: No  Medications: 3 mL lidocaine 1 %; 40 mg methylPREDNISolone acetate 40 MG/ML Outcome: tolerated well, no immediate complications Procedure, treatment alternatives, risks and benefits explained, specific risks discussed. Consent was given by the patient. Immediately prior to procedure a time out was called to verify the correct patient, procedure, equipment, support staff and site/side marked as required. Patient was prepped and draped in the usual sterile fashion.

## 2020-12-18 ENCOUNTER — Ambulatory Visit: Admitting: Orthopaedic Surgery

## 2020-12-19 ENCOUNTER — Ambulatory Visit: Admitting: Physician Assistant

## 2020-12-24 NOTE — Telephone Encounter (Signed)
Please advise 

## 2020-12-31 ENCOUNTER — Encounter: Payer: Self-pay | Admitting: Physician Assistant

## 2020-12-31 ENCOUNTER — Ambulatory Visit (INDEPENDENT_AMBULATORY_CARE_PROVIDER_SITE_OTHER): Payer: BC Managed Care – PPO | Admitting: Physician Assistant

## 2020-12-31 ENCOUNTER — Ambulatory Visit: Payer: Self-pay

## 2020-12-31 DIAGNOSIS — M7632 Iliotibial band syndrome, left leg: Secondary | ICD-10-CM

## 2020-12-31 DIAGNOSIS — M7062 Trochanteric bursitis, left hip: Secondary | ICD-10-CM | POA: Diagnosis not present

## 2020-12-31 DIAGNOSIS — M1712 Unilateral primary osteoarthritis, left knee: Secondary | ICD-10-CM | POA: Diagnosis not present

## 2020-12-31 MED ORDER — METHYLPREDNISOLONE ACETATE 40 MG/ML IJ SUSP
40.0000 mg | INTRAMUSCULAR | Status: AC | PRN
  Administered 2020-12-31: 40 mg via INTRAMUSCULAR

## 2020-12-31 MED ORDER — LIDOCAINE HCL 1 % IJ SOLN
1.0000 mL | INTRAMUSCULAR | Status: AC | PRN
  Administered 2020-12-31: 1 mL

## 2020-12-31 NOTE — Progress Notes (Signed)
Office Visit Note   Patient: Michaela Hanson           Date of Birth: 1966/09/04           MRN: [id number] Visit Date: 12/31/2020              Requested by: Clide Dales, PA No address on file PCP: Clide Dales, PA   Assessment & Plan: Visit Diagnoses:  1. Primary osteoarthritis of left knee   2. Trochanteric bursitis, left hip     Plan: Post injection patient reports that the pain in the lateral aspect of the knee is much improved.  She tolerated the injection well.  Send her to physical therapy for IT band stretching and quad strengthening.  If she continues to have pain lateral aspect of the knee would recommend MRI to rule out any internal derangement.  Otherwise she will follow up with [place] as needed.  Questions encouraged and answered at length.  Follow-Up Instructions: Return if symptoms worsen or fail to improve.   Orders:  Orders Placed This Encounter  Procedures  . Trigger Point Inj  . XR Knee 1-2 Views Left   No orders of the defined types were placed in this encounter.     Procedures: Trigger Point Inj  Date/Time: 12/31/2020 3:33 PM Performed by: Kirtland Bouchard, PA-C Authorized by: Kirtland Bouchard, PA-C   Total # of Trigger Points:  1 Location: lower extremity   Approach:  Lateral Medications #1:  1 mL lidocaine 1 %; 40 mg methylPREDNISolone acetate 40 MG/ML     Clinical Data: No additional findings.   Subjective: Chief Complaint  Patient presents with  . Left Knee - Pain    HPI Michaela Hanson returns today due to lateral left knee pain.  She states the injection helped with her hip but not the pain in the knee.  She notes that apply Voltaren gel lateral aspect knee helps some.  She has had no injury to the knee.  She does note that last Friday she was walking her knee gave way on her.  She denies any numbness tingling down the left leg. Review of Systems See HPI otherwise negative or noncontributory.  Objective: Vital Signs:  There were no vitals taken for this visit.  Physical Exam Constitutional:      Appearance: She is not ill-appearing or diaphoretic.  Pulmonary:     Effort: Pulmonary effort is normal.  Neurological:     Mental Status: She is alert and oriented to person, place, and time.  Psychiatric:        Mood and Affect: Mood normal.     Ortho Exam Left knee: Good range of motion without pain.  McMurray's is negative.  No instability valgus varus stressing left knee.  No abnormal warmth erythema or effusion left knee.  She is tender down the distal IT band maximal tenderness near the insertion of the fibular head.  Slight tenderness over the left trochanteric region but diminished from prior exam.  Good range of motion of the left hip without pain. Specialty Comments:  No specialty comments available.  Imaging: XR Knee 1-2 Views Left  Result Date: 12/31/2020 AP lateral views left knee: Slight narrowing medial joint line.  Lateral joint line well-maintained.  Patellofemoral joints well-maintained.  No acute fractures.  No subluxation dislocation.  No bony abnormalities otherwise.    PMFS History: Patient Active Problem List   Diagnosis Date Noted  . 1st degree AV block 12/06/2019  . History  of colon polyps 07/27/2018  . Pain in right foot 02/24/2018  . Unilateral primary osteoarthritis, left knee 02/24/2018  . Class 2 severe obesity due to excess calories with serious comorbidity and body mass index (BMI) of 39.0 to 39.9 in adult (HCC) 01/19/2018  . Essential hypertension 07/22/2017  . Melasma 04/23/2016  . Left ACL tear 03/07/2016  . Dysplasia of cervix, low grade (CIN 1) 09/21/2015  . H/O LEEP 09/21/2015  . 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 Generalized anxiety disorder 05/07/2015  . Back injury 11/23/2013    Class: Acute   Past Medical History:  Diagnosis Date  . Abnormal Pap smear 8/13   8/13 ASCUS +HPV, 8/14 ASCUS HPV not detected, 06-26-14 ASCUS +HPV HR  .  Anemia    WITH PREGNANCY  . Anxiety   . Arthritis    LEFT KNEE  . Hypertension   . Insomnia   . Migraine headache    with aura (visual changes)    Family History  Problem Relation Age of Onset  . Hypertension Mother   . Hypertension Father   . Breast cancer Sister 44  . Breast cancer Paternal Aunt 47       died of pancreatic cancer age 55    Past Surgical History:  Procedure Laterality Date  . ANKLE FRACTURE SURGERY Right 08/2019  . APPENDECTOMY  2005  . ARTHROSCOPY WITH ANTERIOR CRUCIATE LIGAMENT (ACL) REPAIR WITH ANTERIOR TIBILIAS GRAFT  03/07/2016    Dr. Thamas Jaegers  . BREAST CYST EXCISION Left 6/13  . 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 DILATATION & CURETTAGE/HYSTEROSCOPY WITH MYOSURE N/A 09/15/2017   Procedure: DILATATION & CURETTAGE/HYSTEROSCOPY,WITH ROLLERBALL ABLATION;  Surgeon: Jerene Bears, MD;  Location: [facility];  Service: Gynecology;  Laterality: N/A;  . TUBAL LIGATION  1998   BTL   Social History   Occupational History  . Not on file  Tobacco Use  . Smoking status: Never Smoker  . Smokeless tobacco: Never Used  Vaping Use  . Vaping Use: Never used  Substance and Sexual Activity  . Alcohol use: No  . Drug use: No  . Sexual activity: Yes    Partners: Male    Birth control/protection: Surgical    Comment: BTL

## 2021-02-08 ENCOUNTER — Ambulatory Visit: Payer: BC Managed Care – PPO

## 2021-02-20 ENCOUNTER — Ambulatory Visit (INDEPENDENT_AMBULATORY_CARE_PROVIDER_SITE_OTHER): Payer: BC Managed Care – PPO | Admitting: Physician Assistant

## 2021-02-20 ENCOUNTER — Encounter: Payer: Self-pay | Admitting: Physician Assistant

## 2021-02-20 DIAGNOSIS — M7062 Trochanteric bursitis, left hip: Secondary | ICD-10-CM | POA: Diagnosis not present

## 2021-02-20 DIAGNOSIS — M1712 Unilateral primary osteoarthritis, left knee: Secondary | ICD-10-CM | POA: Diagnosis not present

## 2021-02-20 DIAGNOSIS — M4807 Spinal stenosis, lumbosacral region: Secondary | ICD-10-CM

## 2021-02-20 MED ORDER — CYCLOBENZAPRINE HCL 10 MG PO TABS: 10.0000 mg | ORAL_TABLET | Freq: Every day | ORAL | 0 refills | Status: DC

## 2021-02-20 NOTE — Progress Notes (Signed)
Office Visit Note   Patient: Michaela Hanson           Date of Birth: 1966-07-26           MRN: [id number] Visit Date: 02/20/2021              Requested by: Clide Dales, PA No address on file PCP: Clide Dales, PA   Assessment & Plan: Visit Diagnoses:  1. Spinal stenosis of lumbosacral region   2. Trochanteric bursitis, left hip   3. Primary osteoarthritis of left knee     Plan: Due to the fact that patient has known lumbar spine stenosis and has failed conservative treatment which included therapy and epidural steroid injections will refer her to neurology.  She would like to be referred to Dr.Tonuzi in [facility] who she seen in the past. In regards to her knee and trochanteric bursitis she will follow up with [place] as needed.  Encouraged her to continue to work on quad strengthening and IT band stretching.  Questions were encouraged and answered at length. Follow-Up Instructions: Return if symptoms worsen or fail to improve.   Orders:  No orders of the defined types were placed in this encounter.  Meds ordered this encounter  Medications  . cyclobenzaprine (FLEXERIL) 10 MG tablet    Sig: Take 1 tablet (10 mg total) by mouth at bedtime.    Dispense:  30 tablet    Refill:  0      Procedures: No procedures performed   Clinical Data: No additional findings.   Subjective: Chief Complaint  Patient presents with  . Left Knee - Pain  . Left Hip - Pain    HPI Michaela Hanson returns today follow-up of her left hip and left knee pain.  She states the pain is worse at night now she had a pain is radiating down the left leg to below the knee sometimes down to the ankle.  She also notes the pain is worse if she is standing still.  She has been to therapy per hip pain has been stretching her IT band and feels that this is helped.  She states the injection for her left knee is helped she is having no mechanical symptoms left knee.  She does have some low back pain.   Denies any bowel or bladder dysfunction no saddle anesthesia like symptoms.  She takes ibuprofen for the pain. MRI lumbar spine 03/18/2020 showed moderate spinal stenosis at L4-5 and L5-S1.  Also facet arthritis at L1 -L2,L2-L3,L3-L4.  She underwent epidural steroid injections with Dr. Alvester Morin and states that these really did not help.  Notes state that she is 50% better after the epidural steroid injections with Dr. Alvester Morin but she states these did not last long.  Review of Systems  Constitutional: Negative for chills and fever.  Gastrointestinal: Negative for constipation.  Genitourinary: Negative for difficulty urinating.  Musculoskeletal: Positive for back pain.     Objective: Vital Signs: There were no vitals taken for this visit.  Physical Exam Constitutional:      Appearance: She is not ill-appearing or diaphoretic.  Pulmonary:     Effort: Pulmonary effort is normal.  Neurological:     Mental Status: She is alert and oriented to person, place, and time.  Psychiatric:        Mood and Affect: Mood normal.     Ortho Exam Lower extremities 5 out of 5 strength throughout the lower extremities against resistance.  Positive straight leg raise  on the left.  She has limited forward flexion of the lumbar spine and very limited extension of the lumbar spine.  Tenderness over the left trochanteric region but less sensitive than on prior exam.  Good range of motion left knee without pain. Specialty Comments:  No specialty comments available.  Imaging: No results found.   PMFS History: Patient Active Problem List   Diagnosis Date Noted  . 1st degree AV block 12/06/2019  . History of colon polyps 07/27/2018  . Pain in right foot 02/24/2018  . Unilateral primary osteoarthritis, left knee 02/24/2018  . Class 2 severe obesity due to excess calories with serious comorbidity and body mass index (BMI) of 39.0 to 39.9 in adult (HCC) 01/19/2018  . Essential hypertension 07/22/2017  . Melasma  04/23/2016  . Left ACL tear 03/07/2016  . Dysplasia of cervix, low grade (CIN 1) 09/21/2015  . H/O LEEP 09/21/2015  . 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 Generalized anxiety disorder 05/07/2015  . Back injury 11/23/2013    Class: Acute   Past Medical History:  Diagnosis Date  . Abnormal Pap smear 8/13   8/13 ASCUS +HPV, 8/14 ASCUS HPV not detected, 06-26-14 ASCUS +HPV HR  . Anemia    WITH PREGNANCY  . Anxiety   . Arthritis    LEFT KNEE  . Hypertension   . Insomnia   . Migraine headache    with aura (visual changes)    Family History  Problem Relation Age of Onset  . Hypertension Mother   . Hypertension Father   . Breast cancer Sister 73  . Breast cancer Paternal Aunt 39       died of pancreatic cancer age 43    Past Surgical History:  Procedure Laterality Date  . ANKLE FRACTURE SURGERY Right 08/2019  . APPENDECTOMY  2005  . ARTHROSCOPY WITH ANTERIOR CRUCIATE LIGAMENT (ACL) REPAIR WITH ANTERIOR TIBILIAS GRAFT  03/07/2016    Dr. Thamas Jaegers  . BREAST CYST EXCISION Left 6/13  . 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 DILATATION & CURETTAGE/HYSTEROSCOPY WITH MYOSURE N/A 09/15/2017   Procedure: DILATATION & CURETTAGE/HYSTEROSCOPY,WITH ROLLERBALL ABLATION;  Surgeon: Jerene Bears, MD;  Location: [facility];  Service: Gynecology;  Laterality: N/A;  . TUBAL LIGATION  1998   BTL   Social History   Occupational History  . Not on file  Tobacco Use  . Smoking status: Never Smoker  . Smokeless tobacco: Never Used  Vaping Use  . Vaping Use: Never used  Substance and Sexual Activity  . Alcohol use: No  . Drug use: No  . Sexual activity: Yes    Partners: Male    Birth control/protection: Surgical    Comment: BTL

## 2021-02-21 NOTE — Addendum Note (Signed)
Addended by: Barbette Or on: 02/21/2021 08:26 AM   Modules accepted: Orders

## 2021-02-26 ENCOUNTER — Other Ambulatory Visit: Payer: Self-pay

## 2021-02-26 ENCOUNTER — Encounter (INDEPENDENT_AMBULATORY_CARE_PROVIDER_SITE_OTHER): Payer: Self-pay | Admitting: Family Medicine

## 2021-02-26 ENCOUNTER — Ambulatory Visit (INDEPENDENT_AMBULATORY_CARE_PROVIDER_SITE_OTHER): Payer: BC Managed Care – PPO | Admitting: Family Medicine

## 2021-02-26 VITALS — BP 113/78 | HR 78 | Temp 97.9°F | Ht 68.0 in | Wt 258.0 lb

## 2021-02-26 DIAGNOSIS — I10 Essential (primary) hypertension: Secondary | ICD-10-CM

## 2021-02-26 DIAGNOSIS — Z1331 Encounter for screening for depression: Secondary | ICD-10-CM

## 2021-02-26 DIAGNOSIS — R5383 Other fatigue: Secondary | ICD-10-CM | POA: Diagnosis not present

## 2021-02-26 DIAGNOSIS — E559 Vitamin D deficiency, unspecified: Secondary | ICD-10-CM

## 2021-02-26 DIAGNOSIS — E538 Deficiency of other specified B group vitamins: Secondary | ICD-10-CM | POA: Diagnosis not present

## 2021-02-26 DIAGNOSIS — R7303 Prediabetes: Secondary | ICD-10-CM

## 2021-02-26 DIAGNOSIS — Z0289 Encounter for other administrative examinations: Secondary | ICD-10-CM

## 2021-02-26 DIAGNOSIS — Z6839 Body mass index (BMI) 39.0-39.9, adult: Secondary | ICD-10-CM

## 2021-02-26 DIAGNOSIS — R0602 Shortness of breath: Secondary | ICD-10-CM

## 2021-02-26 DIAGNOSIS — D508 Other iron deficiency anemias: Secondary | ICD-10-CM

## 2021-02-27 LAB — FOLATE: Folate: 12.2 ng/mL (ref >3.0)

## 2021-02-27 LAB — TSH: TSH: 3.72 u[IU]/mL (ref 0.450–4.500)

## 2021-02-27 LAB — CBC WITH DIFFERENTIAL
Basophils Absolute: 0 10*3/uL (ref 0.0–0.2)
Basos: 1 %
EOS (ABSOLUTE): 0 10*3/uL (ref 0.0–0.4)
Eos: 0 %
Hemoglobin: 11.8 g/dL (ref 11.1–15.9)
Immature Grans (Abs): 0 10*3/uL (ref 0.0–0.1)
Immature Granulocytes: 0 %
Lymphocytes Absolute: 1.7 10*3/uL (ref 0.7–3.1)
Lymphs: 33 %
MCH: 30.3 pg (ref 26.6–33.0)
MCHC: 33.9 g/dL (ref 31.5–35.7)
MCV: 89 fL (ref 79–97)
Monocytes Absolute: 0.3 10*3/uL (ref 0.1–0.9)
Monocytes: 6 %
Neutrophils Absolute: 3.1 10*3/uL (ref 1.4–7.0)
Neutrophils: 60 %
RBC: 3.9 x10E6/uL (ref 3.77–5.28)
RDW: 13.9 % (ref 11.7–15.4)
WBC: 5.2 10*3/uL (ref 3.4–10.8)

## 2021-02-27 LAB — LIPID PANEL WITH LDL/HDL RATIO
Cholesterol, Total: 194 mg/dL (ref 100–199)
HDL: 57 mg/dL (ref >39)
LDL Chol Calc (NIH): 125 mg/dL — ABNORMAL HIGH (ref 0–99)
LDL/HDL Ratio: 2.2 ratio (ref 0.0–3.2)
Triglycerides: 63 mg/dL (ref 0–149)
VLDL Cholesterol Cal: 12 mg/dL (ref 5–40)

## 2021-02-27 LAB — COMPREHENSIVE METABOLIC PANEL
ALT: 12 IU/L (ref 0–32)
AST: 19 IU/L (ref 0–40)
Albumin/Globulin Ratio: 1.2 (ref 1.2–2.2)
Albumin: 4.2 g/dL (ref 3.8–4.9)
Alkaline Phosphatase: 88 IU/L (ref 44–121)
BUN/Creatinine Ratio: 16 (ref 9–23)
BUN: 13 mg/dL (ref 6–24)
Bilirubin Total: 0.3 mg/dL (ref 0.0–1.2)
CO2: 25 mmol/L (ref 20–29)
Calcium: 10 mg/dL (ref 8.7–10.2)
Chloride: 103 mmol/L (ref 96–106)
Creatinine, Ser: 0.83 mg/dL (ref 0.57–1.00)
Globulin, Total: 3.6 g/dL (ref 1.5–4.5)
Glucose: 112 mg/dL — ABNORMAL HIGH (ref 65–99)
Potassium: 4.9 mmol/L (ref 3.5–5.2)
Sodium: 144 mmol/L (ref 134–144)
Total Protein: 7.8 g/dL (ref 6.0–8.5)
eGFR: 84 mL/min/{1.73_m2} (ref >59)

## 2021-02-27 LAB — ANEMIA PANEL
Ferritin: 29 ng/mL (ref 15–150)
Folate, Hemolysate: 326 ng/mL
Folate, RBC: 937 ng/mL (ref >498)
Hematocrit: 34.8 % (ref 34.0–46.6)
Iron Saturation: 11 % — ABNORMAL LOW (ref 15–55)
Iron: 45 ug/dL (ref 27–159)
Retic Ct Pct: 1.5 % (ref 0.6–2.6)
Total Iron Binding Capacity: 392 ug/dL (ref 250–450)
UIBC: 347 ug/dL (ref 131–425)
Vitamin B-12: 471 pg/mL (ref 232–1245)

## 2021-02-27 LAB — HEMOGLOBIN A1C
Est. average glucose Bld gHb Est-mCnc: 131 mg/dL
Hgb A1c MFr Bld: 6.2 % — ABNORMAL HIGH (ref 4.8–5.6)

## 2021-02-27 LAB — INSULIN, RANDOM: INSULIN: 15.7 u[IU]/mL (ref 2.6–24.9)

## 2021-02-27 LAB — T4: T4, Total: 9.2 ug/dL (ref 4.5–12.0)

## 2021-02-27 LAB — T3: T3, Total: 156 ng/dL (ref 71–180)

## 2021-02-27 LAB — VITAMIN D 25 HYDROXY (VIT D DEFICIENCY, FRACTURES): Vit D, 25-Hydroxy: 32.6 ng/mL (ref 30.0–100.0)

## 2021-02-27 NOTE — Progress Notes (Signed)
Dear Dr. Hyacinth Meeker,   Thank you for referring Michaela Hanson to our clinic. The following note includes my evaluation and treatment recommendations.  Chief Complaint:   OBESITY Michaela Hanson (MR# [id number]) is a 55 y.o. female who presents for evaluation and treatment of obesity and related comorbidities. Current BMI is Body mass index is 39.23 kg/m. Michaela Hanson has been struggling with her weight for many years and has been unsuccessful in either losing weight, maintaining weight loss, or reaching her healthy weight goal.  Michaela Hanson is currently in the action stage of change and ready to dedicate time achieving and maintaining a healthier weight. Michaela Hanson is interested in becoming our patient and working on intensive lifestyle modifications including (but not limited to) diet and exercise for weight loss.  Michaela Hanson was referred by Dr. Hyacinth Meeker. She previously tried IF and 1200 cal/day diets. Weight gain of 60 lbs from 2017 to present. 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 She is skipping breakfast daily. ~12PM- [place] sandwich (3 slices) with lettuce and tomato and water (satisfied); ~3-4 PM- whole bag of microwaveable popcorn or saltine crackers with peanut butter (10 crackers) or hot fries (thinks she wants to eat) (satisfied); 7 PM dinner- baked chicken 1 drumstick with cabbage (2 cups) and cream of chicken and rice (feels full).  Michaela Hanson's habits were reviewed today and are as follows: Her family eats meals together, she thinks her family will eat healthier with her, her desired weight loss is 58 lbs, she started gaining weight after knee surgery in 2017, her heaviest weight ever was 260 pounds, she is a picky eater and doesn't like to eat healthier foods, she has significant food cravings issues, she skips meals frequently, she is frequently drinking liquids with calories, she frequently makes poor food choices, she frequently eats larger portions than normal,  she has binge eating behaviors and she struggles with emotional eating.  Depression Screen Michaela Hanson's Food and Mood (modified PHQ-9) score was 10.  Depression screen PHQ 2/9 02/26/2021  Decreased Interest 3  Down, Depressed, Hopeless 2  PHQ - 2 Score 5  Altered sleeping 0  Tired, decreased energy 0  Change in appetite 3  Feeling bad or failure about yourself  0  Trouble concentrating 2  Moving slowly or fidgety/restless 0  Suicidal thoughts 0  PHQ-9 Score 10  Difficult doing work/chores Not difficult at all   Subjective:   1. Other fatigue Michaela Hanson admits to daytime somnolence and admits to waking up still tired. Patent has a history of symptoms of daytime fatigue, morning fatigue and morning headache. Michaela Hanson generally gets 4 or 5 hours of sleep per night, and states that she has poor sleep quality. Snoring is present. Apneic episodes are not present. Epworth Sleepiness Score is 10. EKG- 1st degree AV block, NSR (similar to EKG in 2019).  2. SOB (shortness of breath) on exertion Michaela Hanson notes increasing shortness of breath with exercising and seems to be worsening over time with weight gain. She notes getting out of breath sooner with activity than she used to. This has gotten worse recently. Michaela Hanson denies shortness of breath at rest or orthopnea. EKG- 1st degree AV block, NSR (similar to EKG in 2019).  3. Essential hypertension Michaela Hanson's BP is controlled today. She denies chest pain, chest pressure and headache. Pt is on HCTZ 12.5 mg daily. She was diagnosed in 2017.  4. Vitamin B12 deficiency Historical diagnosis. Michaela Hanson was diagnosed ~2 years ago. She is on 500 mg  daily.   5. Vitamin D deficiency Michaela Hanson last Vit D level was 34. She is on Vit D 50,000 IU weekly.  6. Pre-diabetes Michaela Hanson last A1c was 6.0 and insulin level 11.6. She is not on medication.  7. Other iron deficiency anemia Michaela Hanson iron was normal when last checked. Her ferritin was low on last  lab check. She is on ferrous sulfate 325 mg. Her Hgb and Hct were recently low.  Assessment/Plan:   1. Other fatigue Asra does feel that her weight is causing her energy to be lower than it should be. Fatigue may be related to obesity, depression or many other causes. Labs will be ordered, and in the meanwhile, Montasia will focus on self care including making healthy food choices, increasing physical activity and focusing on stress reduction. Check labs today.   - EKG 12-Lead - TSH - Folate - T3 - T4  2. SOB (shortness of breath) on exertion Michaela Hanson does feel that she gets out of breath more easily that she used to when she exercises. Michaela Hanson shortness of breath appears to be obesity related and exercise induced. She has agreed to work on weight loss and gradually increase exercise to treat her exercise induced shortness of breath. Will continue to monitor closely.  3. Essential hypertension Michaela Hanson is working on healthy weight loss and exercise to improve blood pressure control. We will watch for signs of hypotension as she continues her lifestyle modifications. Check labs today.  - Comprehensive metabolic panel - Lipid Panel With LDL/HDL Ratio  4. Vitamin B12 deficiency The diagnosis was reviewed with the patient. Counseling provided today, see below. We will continue to monitor. Orders and follow up as documented in patient record. Check labs today.  Counseling . The body needs vitamin B12: to make red blood cells; to make DNA; and to help the nerves work properly so they can carry messages from the brain to the body.  . The main causes of vitamin B12 deficiency include dietary deficiency, digestive diseases, pernicious anemia, and having a surgery in which part of the stomach or small intestine is removed.  . Certain medicines can make it harder for the body to absorb vitamin B12. These medicines include: heartburn medications; some antibiotics; some medications used to  treat diabetes, gout, and high cholesterol.  . In some cases, there are no symptoms of this condition. If the condition leads to anemia or nerve damage, various symptoms can occur, such as weakness or fatigue, shortness of breath, and numbness or tingling in your hands and feet.   . Treatment:  o May include taking vitamin B12 supplements.  o Avoid alcohol.  o Eat lots of healthy foods that contain vitamin B12: - Beef, pork, chicken, [place], and organ meats, such as liver.  - Seafood: This includes clams, rainbow trout, salmon, tuna, and haddock. Eggs.  - Cereal and dairy products that are fortified: This means that vitamin B12 has been added to the food.  - Vitamin B12  5. Vitamin D deficiency Low Vitamin D level contributes to fatigue and are associated with obesity, breast, and colon cancer. She agrees to continue to take prescription Vitamin D @50 ,000 IU every week and will follow-up for routine testing of Vitamin D, at least 2-3 times per year to avoid over-replacement. Check labs today.  - VITAMIN D 25 Hydroxy (Vit-D Deficiency, Fractures)  6. Pre-diabetes Michaela Hanson will continue to work on weight loss, exercise, and decreasing simple carbohydrates to help decrease the risk of diabetes. Check labs today.  -  Insulin, random - Hemoglobin A1c  7. Other iron deficiency anemia Check labs today.  - CBC With Differential - Anemia panel  8. Screening for depression Avonne had a positive depression screening. Depression is commonly associated with obesity and often results in emotional eating behaviors. We will monitor this closely and work on CBT to help improve the non-hunger eating patterns. Referral to Psychology may be required if no improvement is seen as she continues in our clinic.  9. Class 2 severe obesity due to excess calories with serious comorbidity and body mass index (BMI) of 39.0 to 39.9 in adult Michaela Hanson is currently in the action stage of change and her goal is  to continue with weight loss efforts. I recommend Michaela Hanson begin the structured treatment plan as follows:  She has agreed to the Category 2 Plan with 6 oz protein and additional breakfast.  Exercise goals: No exercise has been prescribed at this time.   Behavioral modification strategies: increasing lean protein intake, no skipping meals, meal planning and cooking strategies and keeping healthy foods in the home.  She was informed of the importance of frequent follow-up visits to maximize her success with intensive lifestyle modifications for her multiple health conditions. She was informed we would discuss her lab results at her next visit unless there is a critical issue that needs to be addressed sooner. Michaela Hanson agreed to keep her next visit at the agreed upon time to discuss these results.  Objective:   Blood pressure 113/78, pulse 78, temperature 97.9 F (36.6 C), height 5\' 8"  (1.727 m), weight 258 lb (117 kg), SpO2 100 %. Body mass index is 39.23 kg/m.  EKG: 1st degree AV block, Normal sinus rhythm, rate 80 (similar to EKG in 2019)  Indirect Calorimeter completed today shows a VO2 of 182 and a REE of 1264.  Her calculated basal metabolic rate is 2020 thus her basal metabolic rate is worse than expected.  General: Cooperative, alert, well developed, in no acute distress. HEENT: Conjunctivae and lids unremarkable. Cardiovascular: Regular rhythm.  Lungs: Normal work of breathing. Neurologic: No focal deficits.   Lab Results  Component Value Date   CREATININE 0.83 02/26/2021   BUN 13 02/26/2021   NA 144 02/26/2021   K 4.9 02/26/2021   CL 103 02/26/2021   CO2 25 02/26/2021   Lab Results  Component Value Date   ALT 12 02/26/2021   AST 19 02/26/2021   ALKPHOS 88 02/26/2021   BILITOT 0.3 02/26/2021   Lab Results  Component Value Date   HGBA1C 6.2 (H) 02/26/2021   HGBA1C 5.7 (H) 11/28/2019   Lab Results  Component Value Date   INSULIN 15.7 02/26/2021   Lab Results   Component Value Date   TSH 3.720 02/26/2021   Lab Results  Component Value Date   CHOL 194 02/26/2021   HDL 57 02/26/2021   LDLCALC 125 (H) 02/26/2021   TRIG 63 02/26/2021   CHOLHDL 2.9 11/28/2019   Lab Results  Component Value Date   WBC 5.2 02/26/2021   HGB 11.8 02/26/2021   HCT 34.8 02/26/2021   MCV 89 02/26/2021   PLT 233 11/28/2019   Lab Results  Component Value Date   IRON 45 02/26/2021   TIBC 392 02/26/2021   FERRITIN 29 02/26/2021    Attestation Statements:   Reviewed by clinician on day of visit: allergies, medications, problem list, medical history, surgical history, family history, social history, and previous encounter notes.  02/28/2021, am acting as transcriptionist  for Reuben LikesAlexandria Jaramie Bastos, MD.  This is the patient's first visit at [facility]. The patient's NEW PATIENT PACKET was reviewed at length. Included in the packet: current and past health history, medications, allergies, ROS, gynecologic history (women only), surgical history, family history, social history, weight history, weight loss surgery history (for those that have had weight loss surgery), nutritional evaluation, mood and food questionnaire, PHQ9, Epworth questionnaire, sleep habits questionnaire, patient life and health improvement goals questionnaire. These will all be scanned into the patient's chart under media.   During the visit, I independently reviewed the patient's EKG, bioimpedance scale results, and indirect calorimeter results. I used this information to tailor a meal plan for the patient that will help her to lose weight and will improve her obesity-related conditions going forward. I performed a medically necessary appropriate examination and/or evaluation. I discussed the assessment and treatment plan with the patient. The patient was provided an opportunity to ask questions and all were answered. The patient agreed with the plan and demonstrated an understanding  of the instructions. Labs were ordered at this visit and will be reviewed at the next visit unless more critical results need to be addressed immediately. Clinical information was updated and documented in the EMR.   Time spent on visit including pre-visit chart review and post-visit care was 60 minutes.   A separate 15 minutes was spent on risk counseling (see above).   I have reviewed the above documentation for accuracy and completeness, and I agree with the above. - Katherina MiresAlexandria Keitha Kolk Kadolph, MD

## 2021-03-12 ENCOUNTER — Ambulatory Visit (INDEPENDENT_AMBULATORY_CARE_PROVIDER_SITE_OTHER): Payer: Federal, State, Local not specified - PPO | Admitting: Family Medicine

## 2021-03-14 ENCOUNTER — Other Ambulatory Visit: Payer: Self-pay

## 2021-03-14 ENCOUNTER — Ambulatory Visit (INDEPENDENT_AMBULATORY_CARE_PROVIDER_SITE_OTHER): Payer: BC Managed Care – PPO | Admitting: Family Medicine

## 2021-03-14 ENCOUNTER — Encounter (INDEPENDENT_AMBULATORY_CARE_PROVIDER_SITE_OTHER): Payer: Self-pay | Admitting: Family Medicine

## 2021-03-14 VITALS — BP 129/84 | HR 92 | Temp 97.9°F | Ht 68.0 in | Wt 247.0 lb

## 2021-03-14 DIAGNOSIS — Z6839 Body mass index (BMI) 39.0-39.9, adult: Secondary | ICD-10-CM

## 2021-03-14 DIAGNOSIS — I440 Atrioventricular block, first degree: Secondary | ICD-10-CM

## 2021-03-14 DIAGNOSIS — R7303 Prediabetes: Secondary | ICD-10-CM | POA: Diagnosis not present

## 2021-03-14 DIAGNOSIS — I10 Essential (primary) hypertension: Secondary | ICD-10-CM

## 2021-03-14 DIAGNOSIS — D649 Anemia, unspecified: Secondary | ICD-10-CM | POA: Insufficient documentation

## 2021-03-14 DIAGNOSIS — E538 Deficiency of other specified B group vitamins: Secondary | ICD-10-CM | POA: Diagnosis not present

## 2021-03-14 DIAGNOSIS — D508 Other iron deficiency anemias: Secondary | ICD-10-CM

## 2021-03-14 DIAGNOSIS — E66812 Obesity, class 2: Secondary | ICD-10-CM

## 2021-03-14 DIAGNOSIS — E559 Vitamin D deficiency, unspecified: Secondary | ICD-10-CM

## 2021-03-14 DIAGNOSIS — E7849 Other hyperlipidemia: Secondary | ICD-10-CM | POA: Insufficient documentation

## 2021-03-18 ENCOUNTER — Ambulatory Visit (INDEPENDENT_AMBULATORY_CARE_PROVIDER_SITE_OTHER): Payer: Federal, State, Local not specified - PPO | Admitting: Family Medicine

## 2021-03-25 MED ORDER — VITAMIN D3 125 MCG (5000 UT) PO TABS: ORAL_TABLET | ORAL | 0 refills | Status: DC

## 2021-03-25 NOTE — Progress Notes (Signed)
Chief Complaint:   OBESITY Michaela Hanson is here to discuss her progress with her obesity treatment plan along with follow-up of her obesity related diagnoses.   Today's visit was #: 2 Starting weight: 258 lbs Starting date: 02/26/2021 Today's weight: 247 lbs Today's date: 03/14/2021 Weight change since last visit: 11 lbs Total lbs lost to date: 11 lbs Body mass index is 37.56 kg/m.  Total weight loss percentage to date: -4.26%  Interim History:  Michaela Hanson is here today for her first follow-up office visit since starting the program with [place].  Patient has lost 11 pounds since last office visit.  All blood work/ lab tests that were recently ordered by myself or an outside provider were reviewed with patient today per their request.   Extended time was spent counseling Michaela Hanson on all new disease processes that were discovered or that are worsening.  Patient was counseled on all abnormalities and we discussed dietary and lifestyle changes that could help those values (along with medications if/when appropriate).  Extensive health counseling performed and patient's concerns/ questions were addressed.   Pt understands that many of these abnormalities will need to monitored regularly along with the prudent dietary changes we are making each and every office visit  We reviewed her NEW Meal Plan in detail and questions were answered.    Patient's food recall appears to be accurate and consistent with what is on plan when she is following it.   When eating on plan, her hunger and cravings are well controlled.    This is Michaela Hanson's first office visit with me.  She was seen prior by Dr. Lawson Radar.    She says she is bored with her food options.  Current Meal Plan: the Category 2 Plan with 6 ounces of protein and additional breakfast for 95% of the time.   Current Exercise Plan: None.  Assessment/Plan:    Meds ordered this encounter  Medications  . Cholecalciferol (VITAMIN D3) 125 MCG  (5000 UT) TABS    Sig: 5,000 IU vitamin D3 daily.    Dispense:  30 tablet    Refill:  0      1. Essential hypertension At goal. Medications: HCTZ 25 mg daily.  At home, blood pressure is 120-125/78-85 on a regular basis.  She has been on blood pressure medication since 2018.  Plan:  Discussed labs with patient today.  Avoid buying foods that are: processed, frozen, or prepackaged to avoid excess salt. We will watch for signs of hypotension as she continues lifestyle modifications. We will continue to monitor closely alongside her PCP and/or Specialist.  Regular follow up with PCP and specialists was also encouraged.   BP Readings from Last 3 Encounters:  03/14/21 129/84  02/26/21 113/78  12/05/20 119/83   Lab Results  Component Value Date   CREATININE 0.83 02/26/2021   2. Prediabetes Not at goal. Goal is HgbA1c < 5.7.  Medication: None.    Plan:  Discussed labs with patient today.  She will continue to focus on protein-rich, low simple carbohydrate foods. We reviewed the importance of hydration, regular exercise for stress reduction, and restorative sleep.    Recheck labs every 3 months.    - Consider medication in the future as she is not interested in starting any at this time.  Lab Results  Component Value Date   HGBA1C 6.2 (H) 02/26/2021   Lab Results  Component Value Date   INSULIN 15.7 02/26/2021   3. 1st degree AV block  Michaela Hanson is followed by Dr. Karren Burly at [facility]. She was told to lose weight to help with her condition.  She is seen once a year.  Plan:  We will follow alongside specialists as it relates to her weight loss journey. Sx stable and no acute concerns today  4. Vitamin B12 deficiency Lab Results  Component Value Date   VITAMINB12 471 02/26/2021   Supplementation:  OTC vitamin B12.   Plan:  Discussed labs with patient today.  Continue OTC supplement.  5. Vitamin D deficiency Not at goal. Current vitamin D is 32.6, tested on 02/26/2021. Optimal  goal > 50 ng/dL.   Plan:  Discussed labs with patient today.  Continue to take prescription Vitamin D @50 ,000 IU every week as prescribed.  Vitamin D level is too low.   - Continue once weekly vitamin D BUT ALSO add 5,000 IU vitamin D.  - Discussed importance of vitamin D to their health and well-being.  - possible symptoms of low Vitamin D can be low energy, depressed mood, muscle aches, joint aches, osteoporosis etc. - low Vitamin D levels may be linked to an increased risk of cardiovascular events and even increased risk of cancers- such as colon and breast.  - Informed patient this may be a lifelong thing, and she was encouraged to continue to take the medicine until told otherwise.   - we will need to monitor levels regularly (every 3-4 mo on average) to keep levels within normal limits.  - weight loss will likely improve availability of vitamin D, thus encouraged Michaela Hanson to continue with meal plan and their weight loss efforts to further improve this condition - pt's questions and concerns regarding this condition addressed.   6. Other iron deficiency anemia She is taking ferrous sulfate 325 mg 3 days per week.  Plan:  Discussed labs with patient today.  Labs stable.  Increase iron supplement to 325 mg every other day.  Nutrition: Iron-rich foods include dark leafy greens, red and white meats, eggs, seafood, and beans.  Certain foods and drinks prevent your body from absorbing iron properly. Avoid eating these foods in the same meal as iron-rich foods or with iron supplements. These foods include: coffee, black tea, and red wine; milk, dairy products, and foods that are high in calcium; beans and soybeans; whole grains. Constipation can be a side effect of iron supplementation. Increased water and fiber intake are helpful. Water goal: > 2 liters/day. Fiber goal: > 25 grams/day.  CBC Latest Ref Rng & Units 02/26/2021 11/28/2019 08/18/2018  WBC 3.4 - 10.8 x10E3/uL 5.2 3.4 3.4  Hemoglobin 11.1  - 15.9 g/dL 10/18/2018 10.9(L) 12.1  Hematocrit 34.0 - 46.6 % 34.8 33.4(L) 36.0  Platelets 150 - 450 x10E3/uL - 233 265   Lab Results  Component Value Date   IRON 45 02/26/2021   TIBC 392 02/26/2021   FERRITIN 29 02/26/2021   Lab Results  Component Value Date   VITAMINB12 471 02/26/2021   7. Other hyperlipidemia Course: Uncontrolled.   Lipid-lowering medications: None.  Elevated LDL from prior.   Plan: Discussed labs with patient today.  Dietary changes: Increase soluble fiber, decrease simple carbohydrates, decrease saturated fat. Exercise changes: Moderate to vigorous-intensity aerobic activity 150 minutes per week or as tolerated. We will continue to monitor along with PCP/specialists as it pertains to her weight loss journey.  Decrease saturated/trans fats and recheck labs in 6 months or so.  Lab Results  Component Value Date   CHOL 194 02/26/2021   HDL  57 02/26/2021   LDLCALC 125 (H) 02/26/2021   TRIG 63 02/26/2021   CHOLHDL 2.9 11/28/2019   Lab Results  Component Value Date   ALT 12 02/26/2021   AST 19 02/26/2021   ALKPHOS 88 02/26/2021   BILITOT 0.3 02/26/2021   The 10-year ASCVD risk score Denman George DC Jr., et al., 2013) is: 4.1%   Values used to calculate the score:     Age: 55 years     Sex: Female     Is Non-Hispanic African American: Yes     Diabetic: No     Tobacco smoker: No     Systolic Blood Pressure: 129 mmHg     Is BP treated: Yes     HDL Cholesterol: 57 mg/dL     Total Cholesterol: 194 mg/dL  8. Obesity, current BMI 37.6  Course: Michaela Hanson is currently in the action stage of change. As such, her goal is to continue with weight loss efforts.   Nutrition goals: She has agreed to the Category 2 Plan but we added breakfast and lunch options.   Exercise goals: As is.  Behavioral modification strategies: increasing lean protein intake, decreasing simple carbohydrates, meal planning and cooking strategies, keeping healthy foods in the home and planning for  success.  Michaela Hanson has agreed to follow-up with our clinic in 2 weeks. She was informed of the importance of frequent follow-up visits to maximize her success with intensive lifestyle modifications for her multiple health conditions.   Objective:   Blood pressure 129/84, pulse 92, temperature 97.9 F (36.6 C), height 5\' 8"  (1.727 m), weight 247 lb (112 kg), SpO2 99 %. Body mass index is 37.56 kg/m.  General: Cooperative, alert, well developed, in no acute distress. HEENT: Conjunctivae and lids unremarkable. Cardiovascular: Regular rhythm.  Lungs: Normal work of breathing. Neurologic: No focal deficits.   Lab Results  Component Value Date   CREATININE 0.83 02/26/2021   BUN 13 02/26/2021   NA 144 02/26/2021   K 4.9 02/26/2021   CL 103 02/26/2021   CO2 25 02/26/2021   Lab Results  Component Value Date   ALT 12 02/26/2021   AST 19 02/26/2021   ALKPHOS 88 02/26/2021   BILITOT 0.3 02/26/2021   Lab Results  Component Value Date   HGBA1C 6.2 (H) 02/26/2021   HGBA1C 5.7 (H) 11/28/2019   Lab Results  Component Value Date   INSULIN 15.7 02/26/2021   Lab Results  Component Value Date   TSH 3.720 02/26/2021   Lab Results  Component Value Date   CHOL 194 02/26/2021   HDL 57 02/26/2021   LDLCALC 125 (H) 02/26/2021   TRIG 63 02/26/2021   CHOLHDL 2.9 11/28/2019   Lab Results  Component Value Date   WBC 5.2 02/26/2021   HGB 11.8 02/26/2021   HCT 34.8 02/26/2021   MCV 89 02/26/2021   PLT 233 11/28/2019   Lab Results  Component Value Date   IRON 45 02/26/2021   TIBC 392 02/26/2021   FERRITIN 29 02/26/2021   Attestation Statements:   Reviewed by clinician on day of visit: allergies, medications, problem list, medical history, surgical history, family history, social history, and previous encounter notes.  Time spent on visit including pre-visit chart review and post-visit care and charting was >49 minutes.   I, 02/28/2021, CMA, am acting as Insurance claims handler for  Energy manager, DO.  I have reviewed the above documentation for accuracy and completeness, and I agree with the above. [employer], D.O.  The [place] was signed into law in 2016 which includes the topic of electronic health records.  This provides immediate access to information in MyChart.  This includes consultation notes, operative notes, office notes, lab results and pathology reports.  If you have any questions about what you read please let us know at your next visit so we can discuss your concerns and take corrective action if need be.  We are right here with you.

## 2021-03-28 ENCOUNTER — Ambulatory Visit (INDEPENDENT_AMBULATORY_CARE_PROVIDER_SITE_OTHER): Payer: BC Managed Care – PPO | Admitting: Family Medicine

## 2021-03-28 ENCOUNTER — Other Ambulatory Visit: Payer: Self-pay

## 2021-03-28 ENCOUNTER — Encounter (INDEPENDENT_AMBULATORY_CARE_PROVIDER_SITE_OTHER): Payer: Self-pay | Admitting: Family Medicine

## 2021-03-28 VITALS — BP 118/77 | HR 83 | Temp 97.8°F | Ht 68.0 in | Wt 243.0 lb

## 2021-03-28 DIAGNOSIS — Z6839 Body mass index (BMI) 39.0-39.9, adult: Secondary | ICD-10-CM | POA: Diagnosis not present

## 2021-03-28 DIAGNOSIS — I10 Essential (primary) hypertension: Secondary | ICD-10-CM | POA: Diagnosis not present

## 2021-03-29 ENCOUNTER — Encounter (INDEPENDENT_AMBULATORY_CARE_PROVIDER_SITE_OTHER): Payer: Self-pay | Admitting: Family Medicine

## 2021-04-01 NOTE — Telephone Encounter (Signed)
Pt last seen by Dawn Whitmire, FNP.  

## 2021-04-02 ENCOUNTER — Encounter (INDEPENDENT_AMBULATORY_CARE_PROVIDER_SITE_OTHER): Payer: Self-pay | Admitting: Family Medicine

## 2021-04-02 NOTE — Progress Notes (Signed)
     Chief Complaint:   OBESITY Michaela Hanson is here to discuss her progress with her obesity treatment plan along with follow-up of her obesity related diagnoses. Michaela Hanson is on the Category 2 Plan with breakfast and lunch options and states she is following her eating plan approximately 93% of the time. Michaela Hanson states she is not exercising regularly.  Today's visit was #: 3 Starting weight: 258 lbs Starting date: 02/26/2021 Today's weight: 243 lbs Today's date: 03/28/2021 Total lbs lost to date: 15 lbs Total lbs lost since last in-office visit: 4 lbs  Interim History: Michaela Hanson says that her grandmother passed away, which had her a bit off plan.  She has enjoyed the breakfast and lunch options provided.  She misses eating cereal and grits.  She does not like the Special K protein cereal.  Hunger is satisfied.  Subjective:   1. Essential hypertension Well controlled on HCTZ.  BP Readings from Last 3 Encounters:  03/28/21 118/77  03/14/21 129/84  02/26/21 113/78   Assessment/Plan:   1. Essential hypertension Continue HCTZ.  2. Obesity, current BMI 36.96  Michaela Hanson is currently in the action stage of change. As such, her goal is to continue with weight loss efforts. She has agreed to the Category 2 Plan.   May have Kashi Cinnamon Crunch cereal with Fairlife skim or 1% milk for breakfast.  Exercise goals: No exercise has been prescribed at this time.  Behavioral modification strategies: meal planning and cooking strategies.  Michaela Hanson has agreed to follow-up with our clinic in 2-3 weeks.   Objective:   Blood pressure 118/77, pulse 83, temperature 97.8 F (36.6 C), temperature source Oral, height 5\' 8"  (1.727 m), weight 243 lb (110.2 kg), SpO2 95 %. Body mass index is 36.95 kg/m.  General: Cooperative, alert, well developed, in no acute distress. HEENT: Conjunctivae and lids unremarkable. Cardiovascular: Regular rhythm.  Lungs: Normal work of breathing. Neurologic: No  focal deficits.   Lab Results  Component Value Date   CREATININE 0.83 02/26/2021   BUN 13 02/26/2021   NA 144 02/26/2021   K 4.9 02/26/2021   CL 103 02/26/2021   CO2 25 02/26/2021   Lab Results  Component Value Date   ALT 12 02/26/2021   AST 19 02/26/2021   ALKPHOS 88 02/26/2021   BILITOT 0.3 02/26/2021   Lab Results  Component Value Date   HGBA1C 6.2 (H) 02/26/2021   HGBA1C 5.7 (H) 11/28/2019   Lab Results  Component Value Date   INSULIN 15.7 02/26/2021   Lab Results  Component Value Date   TSH 3.720 02/26/2021   Lab Results  Component Value Date   CHOL 194 02/26/2021   HDL 57 02/26/2021   LDLCALC 125 (H) 02/26/2021   TRIG 63 02/26/2021   CHOLHDL 2.9 11/28/2019   Lab Results  Component Value Date   WBC 5.2 02/26/2021   HGB 11.8 02/26/2021   HCT 34.8 02/26/2021   MCV 89 02/26/2021   PLT 233 11/28/2019   Lab Results  Component Value Date   IRON 45 02/26/2021   TIBC 392 02/26/2021   FERRITIN 29 02/26/2021   Attestation Statements:   Reviewed by clinician on day of visit: allergies, medications, problem list, medical history, surgical history, family history, social history, and previous encounter notes.  I, 02/28/2021, CMA, am acting as Insurance claims handler for Energy manager, FNP.  I have reviewed the above documentation for accuracy and completeness, and I agree with the above. -  [place], FNP

## 2021-04-15 ENCOUNTER — Ambulatory Visit (INDEPENDENT_AMBULATORY_CARE_PROVIDER_SITE_OTHER): Payer: BC Managed Care – PPO | Admitting: Family Medicine

## 2021-04-15 ENCOUNTER — Encounter (INDEPENDENT_AMBULATORY_CARE_PROVIDER_SITE_OTHER): Payer: Self-pay | Admitting: Family Medicine

## 2021-04-15 ENCOUNTER — Other Ambulatory Visit: Payer: Self-pay

## 2021-04-15 VITALS — BP 125/83 | HR 76 | Temp 98.9°F | Ht 68.0 in | Wt 237.0 lb

## 2021-04-15 DIAGNOSIS — Z6839 Body mass index (BMI) 39.0-39.9, adult: Secondary | ICD-10-CM | POA: Diagnosis not present

## 2021-04-15 DIAGNOSIS — I10 Essential (primary) hypertension: Secondary | ICD-10-CM

## 2021-04-17 NOTE — Progress Notes (Signed)
     Chief Complaint:   OBESITY Michaela Hanson is here to discuss her progress with her obesity treatment plan along with follow-up of her obesity related diagnoses. Michaela Hanson is on the Category 2 Plan and states she is following her eating plan approximately 95% of the time. Michaela Hanson states she is not currently exercising.  Today's visit was #: 4 Starting weight: 258 lbs Starting date: 02/26/2021 Today's weight: 237 lbs Today's date: 04/15/2021 Total lbs lost to date: 21 lbs Total lbs lost since last in-office visit: 6  Interim History: Hunger is well satisfied. Michaela Hanson cooks dinner in the evenings and stays on plan. She is happy with food on the plan but misses potatoes.  Subjective:   1. Essential hypertension Michaela Hanson's BP is well controlled on HCTZ.  BP Readings from Last 3 Encounters:  04/15/21 125/83  03/28/21 118/77  03/14/21 129/84    Assessment/Plan:   1. Essential hypertension Continue HCTZ.  2. Obesity, current BMI 36.04  Michaela Hanson is currently in the action stage of change. As such, her goal is to continue with weight loss efforts. She has agreed to the Category 2 Plan.   Discussed meal planning options- potato at dinner, using bread calories from extra lunch options.  Exercise goals:  Walk for 15 minutes 3 times a week  Behavioral modification strategies: meal planning and cooking strategies.  Michaela Hanson has agreed to follow-up with our clinic in 3 weeks.  Objective:   Blood pressure 125/83, pulse 76, temperature 98.9 F (37.2 C), height 5\' 8"  (1.727 m), weight 237 lb (107.5 kg), SpO2 100 %. Body mass index is 36.04 kg/m.  General: Cooperative, alert, well developed, in no acute distress. HEENT: Conjunctivae and lids unremarkable. Cardiovascular: Regular rhythm.  Lungs: Normal work of breathing. Neurologic: No focal deficits.   Lab Results  Component Value Date   CREATININE 0.83 02/26/2021   BUN 13 02/26/2021   NA 144 02/26/2021   K 4.9 02/26/2021    CL 103 02/26/2021   CO2 25 02/26/2021   Lab Results  Component Value Date   ALT 12 02/26/2021   AST 19 02/26/2021   ALKPHOS 88 02/26/2021   BILITOT 0.3 02/26/2021   Lab Results  Component Value Date   HGBA1C 6.2 (H) 02/26/2021   HGBA1C 5.7 (H) 11/28/2019   Lab Results  Component Value Date   INSULIN 15.7 02/26/2021   Lab Results  Component Value Date   TSH 3.720 02/26/2021   Lab Results  Component Value Date   CHOL 194 02/26/2021   HDL 57 02/26/2021   LDLCALC 125 (H) 02/26/2021   TRIG 63 02/26/2021   CHOLHDL 2.9 11/28/2019   Lab Results  Component Value Date   WBC 5.2 02/26/2021   HGB 11.8 02/26/2021   HCT 34.8 02/26/2021   MCV 89 02/26/2021   PLT 233 11/28/2019   Lab Results  Component Value Date   IRON 45 02/26/2021   TIBC 392 02/26/2021   FERRITIN 29 02/26/2021     Attestation Statements:   Reviewed by clinician on day of visit: allergies, medications, problem list, medical history, surgical history, family history, social history, and previous encounter notes.  02/28/2021, CMA, am acting as Edmund Hilda for Energy manager, FNP.  I have reviewed the above documentation for accuracy and completeness, and I agree with the above. - [place], FNP

## 2021-04-22 ENCOUNTER — Ambulatory Visit (INDEPENDENT_AMBULATORY_CARE_PROVIDER_SITE_OTHER): Payer: BC Managed Care – PPO | Admitting: Family Medicine

## 2021-05-07 ENCOUNTER — Other Ambulatory Visit: Payer: Self-pay

## 2021-05-07 ENCOUNTER — Encounter (INDEPENDENT_AMBULATORY_CARE_PROVIDER_SITE_OTHER): Payer: Self-pay | Admitting: Family Medicine

## 2021-05-07 ENCOUNTER — Ambulatory Visit (INDEPENDENT_AMBULATORY_CARE_PROVIDER_SITE_OTHER): Payer: BC Managed Care – PPO | Admitting: Family Medicine

## 2021-05-07 VITALS — BP 112/76 | HR 73 | Temp 98.6°F | Ht 68.0 in | Wt 235.0 lb

## 2021-05-07 DIAGNOSIS — R7303 Prediabetes: Secondary | ICD-10-CM | POA: Diagnosis not present

## 2021-05-07 DIAGNOSIS — Z6839 Body mass index (BMI) 39.0-39.9, adult: Secondary | ICD-10-CM

## 2021-05-08 NOTE — Telephone Encounter (Signed)
Please advise 

## 2021-05-08 NOTE — Telephone Encounter (Signed)
Pt last seen by Dawn Whitmire, FNP.  

## 2021-05-09 ENCOUNTER — Encounter (INDEPENDENT_AMBULATORY_CARE_PROVIDER_SITE_OTHER): Payer: Self-pay | Admitting: Family Medicine

## 2021-05-09 NOTE — Progress Notes (Signed)
Chief Complaint:   OBESITY Michaela Hanson is here to discuss her progress with her obesity treatment plan along with follow-up of her obesity related diagnoses. Michaela Hanson is on the Category 2 Plan and states she is following her eating plan approximately 90% of the time. [name] states she is walking for 15 minutes 3 times per week.  Today's visit was #: 5 Starting weight: 258 lbs Starting date: 02/26/2021 Today's weight: 235 lbs Today's date: 05/07/2021 Total lbs lost to date: 23 Total lbs lost since last in-office visit: 2  Interim History: [name] feels her weight is plateaued, but she is actually down 2 lbs today. She traveled for a funeral but she made very good choices. She focused on protein and vegetables. She says she is never really hungry.  Subjective:   1. Pre-diabetes Michaela Hanson's last A1c was 6.2. She is not on metformin, and she denies polyphagia.  Lab Results  Component Value Date   HGBA1C 6.2 (H) 02/26/2021   Lab Results  Component Value Date   INSULIN 15.7 02/26/2021   Assessment/Plan:   1. Pre-diabetes Michaela Hanson will continue to work on weight loss, exercise, and decreasing simple carbohydrates to help decrease the risk of diabetes. We will recheck her A1c in 6 weeks.   2. Obesity, current BMI 35.74 Michaela Hanson is currently in the action stage of change. As such, her goal is to continue with weight loss efforts. She has agreed to the Category 2 Plan.   Eating Out handout was given today.  Exercise goals: For substantial health benefits, adults should do at least 150 minutes (2 hours and 30 minutes) a week of moderate-intensity, or 75 minutes (1 hour and 15 minutes) a week of vigorous-intensity aerobic physical activity, or an equivalent combination of moderate- and vigorous-intensity aerobic activity. Aerobic activity should be performed in episodes of at least 10 minutes, and preferably, it should be spread throughout the week.  Behavioral modification  strategies: meal planning and cooking strategies.  Michaela Hanson has agreed to follow-up with our clinic in 3 weeks.  Objective:   Blood pressure 112/76, pulse 73, temperature 98.6 F (37 C), height 5\' 8"  (1.727 m), weight 235 lb (106.6 kg), SpO2 98 %. Body mass index is 35.73 kg/m.  General: Cooperative, alert, well developed, in no acute distress. HEENT: Conjunctivae and lids unremarkable. Cardiovascular: Regular rhythm.  Lungs: Normal work of breathing. Neurologic: No focal deficits.   Lab Results  Component Value Date   CREATININE 0.83 02/26/2021   BUN 13 02/26/2021   NA 144 02/26/2021   K 4.9 02/26/2021   CL 103 02/26/2021   CO2 25 02/26/2021   Lab Results  Component Value Date   ALT 12 02/26/2021   AST 19 02/26/2021   ALKPHOS 88 02/26/2021   BILITOT 0.3 02/26/2021   Lab Results  Component Value Date   HGBA1C 6.2 (H) 02/26/2021   HGBA1C 5.7 (H) 11/28/2019   Lab Results  Component Value Date   INSULIN 15.7 02/26/2021   Lab Results  Component Value Date   TSH 3.720 02/26/2021   Lab Results  Component Value Date   CHOL 194 02/26/2021   HDL 57 02/26/2021   LDLCALC 125 (H) 02/26/2021   TRIG 63 02/26/2021   CHOLHDL 2.9 11/28/2019   Lab Results  Component Value Date   VD25OH 32.6 02/26/2021   VD25OH 21.0 (L) 11/28/2019   VD25OH 35.4 04/13/2019   Lab Results  Component Value Date   WBC 5.2 02/26/2021   HGB 11.8 02/26/2021  HCT 34.8 02/26/2021   MCV 89 02/26/2021   PLT 233 11/28/2019   Lab Results  Component Value Date   IRON 45 02/26/2021   TIBC 392 02/26/2021   FERRITIN 29 02/26/2021   Attestation Statements:   Reviewed by clinician on day of visit: allergies, medications, problem list, medical history, surgical history, family history, social history, and previous encounter notes.   Trude Mcburney, am acting as Energy manager for [place], FNP-C.  I have reviewed the above documentation for accuracy and completeness, and I agree with  the above. -  Jesse Sans, FNP

## 2021-05-28 ENCOUNTER — Other Ambulatory Visit: Payer: Self-pay

## 2021-05-28 ENCOUNTER — Ambulatory Visit (INDEPENDENT_AMBULATORY_CARE_PROVIDER_SITE_OTHER): Payer: BC Managed Care – PPO | Admitting: Family Medicine

## 2021-05-28 ENCOUNTER — Encounter (INDEPENDENT_AMBULATORY_CARE_PROVIDER_SITE_OTHER): Payer: Self-pay | Admitting: Family Medicine

## 2021-05-28 VITALS — BP 114/81 | HR 77 | Temp 97.9°F | Ht 68.0 in | Wt 233.0 lb

## 2021-05-28 DIAGNOSIS — E559 Vitamin D deficiency, unspecified: Secondary | ICD-10-CM | POA: Diagnosis not present

## 2021-05-28 DIAGNOSIS — Z6839 Body mass index (BMI) 39.0-39.9, adult: Secondary | ICD-10-CM

## 2021-05-28 DIAGNOSIS — E66812 Obesity, class 2: Secondary | ICD-10-CM

## 2021-05-28 DIAGNOSIS — R7303 Prediabetes: Secondary | ICD-10-CM

## 2021-05-28 DIAGNOSIS — D509 Iron deficiency anemia, unspecified: Secondary | ICD-10-CM

## 2021-05-28 DIAGNOSIS — E7849 Other hyperlipidemia: Secondary | ICD-10-CM | POA: Diagnosis not present

## 2021-05-28 DIAGNOSIS — Z9189 Other specified personal risk factors, not elsewhere classified: Secondary | ICD-10-CM

## 2021-05-29 ENCOUNTER — Encounter (INDEPENDENT_AMBULATORY_CARE_PROVIDER_SITE_OTHER): Payer: Self-pay | Admitting: Family Medicine

## 2021-05-29 LAB — CBC WITH DIFFERENTIAL/PLATELET
Basophils Absolute: 0 10*3/uL (ref 0.0–0.2)
Basos: 1 %
EOS (ABSOLUTE): 0.1 10*3/uL (ref 0.0–0.4)
Eos: 1 %
Hematocrit: 38.5 % (ref 34.0–46.6)
Hemoglobin: 12.4 g/dL (ref 11.1–15.9)
Immature Grans (Abs): 0 10*3/uL (ref 0.0–0.1)
Immature Granulocytes: 0 %
Lymphocytes Absolute: 2.1 10*3/uL (ref 0.7–3.1)
Lymphs: 38 %
MCH: 29.4 pg (ref 26.6–33.0)
MCHC: 32.2 g/dL (ref 31.5–35.7)
MCV: 91 fL (ref 79–97)
Monocytes Absolute: 0.3 10*3/uL (ref 0.1–0.9)
Monocytes: 5 %
Neutrophils Absolute: 3 10*3/uL (ref 1.4–7.0)
Neutrophils: 55 %
Platelets: 249 10*3/uL (ref 150–450)
RBC: 4.22 x10E6/uL (ref 3.77–5.28)
RDW: 14.5 % (ref 11.7–15.4)
WBC: 5.4 10*3/uL (ref 3.4–10.8)

## 2021-05-29 LAB — COMPREHENSIVE METABOLIC PANEL
ALT: 12 IU/L (ref 0–32)
AST: 18 IU/L (ref 0–40)
Albumin/Globulin Ratio: 1.2 (ref 1.2–2.2)
Albumin: 4.2 g/dL (ref 3.8–4.9)
Alkaline Phosphatase: 89 IU/L (ref 44–121)
BUN/Creatinine Ratio: 17 (ref 9–23)
BUN: 16 mg/dL (ref 6–24)
Bilirubin Total: 0.6 mg/dL (ref 0.0–1.2)
CO2: 25 mmol/L (ref 20–29)
Calcium: 9.8 mg/dL (ref 8.7–10.2)
Chloride: 99 mmol/L (ref 96–106)
Creatinine, Ser: 0.95 mg/dL (ref 0.57–1.00)
Globulin, Total: 3.5 g/dL (ref 1.5–4.5)
Glucose: 87 mg/dL (ref 65–99)
Potassium: 4.5 mmol/L (ref 3.5–5.2)
Sodium: 140 mmol/L (ref 134–144)
Total Protein: 7.7 g/dL (ref 6.0–8.5)
eGFR: 72 mL/min/{1.73_m2} (ref >59)

## 2021-05-29 LAB — LIPID PANEL WITH LDL/HDL RATIO
Cholesterol, Total: 176 mg/dL (ref 100–199)
HDL: 55 mg/dL (ref >39)
LDL Chol Calc (NIH): 113 mg/dL — ABNORMAL HIGH (ref 0–99)
LDL/HDL Ratio: 2.1 ratio (ref 0.0–3.2)
Triglycerides: 36 mg/dL (ref 0–149)
VLDL Cholesterol Cal: 8 mg/dL (ref 5–40)

## 2021-05-29 LAB — IRON AND TIBC
Iron Saturation: 17 % (ref 15–55)
Iron: 60 ug/dL (ref 27–159)
Total Iron Binding Capacity: 358 ug/dL (ref 250–450)
UIBC: 298 ug/dL (ref 131–425)

## 2021-05-29 LAB — FERRITIN: Ferritin: 64 ng/mL (ref 15–150)

## 2021-05-29 LAB — VITAMIN B12: Vitamin B-12: 998 pg/mL (ref 232–1245)

## 2021-05-29 LAB — HEMOGLOBIN A1C
Est. average glucose Bld gHb Est-mCnc: 134 mg/dL
Hgb A1c MFr Bld: 6.3 % — ABNORMAL HIGH (ref 4.8–5.6)

## 2021-05-29 LAB — VITAMIN D 25 HYDROXY (VIT D DEFICIENCY, FRACTURES): Vit D, 25-Hydroxy: 50 ng/mL (ref 30.0–100.0)

## 2021-05-29 LAB — FOLATE: Folate: >20 ng/mL (ref >3.0)

## 2021-05-29 LAB — INSULIN, RANDOM: INSULIN: 8.4 u[IU]/mL (ref 2.6–24.9)

## 2021-05-29 NOTE — Telephone Encounter (Signed)
Please advise 

## 2021-06-03 NOTE — Progress Notes (Signed)
Chief Complaint:   OBESITY Michaela Hanson is here to discuss her progress with her obesity treatment plan along with follow-up of her obesity related diagnoses. Michaela Hanson is on the Category 2 Plan and states she is following her eating plan approximately 93% of the time. Michaela Hanson states she is walking 15-20 minutes 3 times per week.  Today's visit was #: 6 Starting weight: 258 lbs Starting date: 02/26/2021 Today's weight: 233 lbs Today's date: 05/28/2021 Total lbs lost to date: 25 Total lbs lost since last in-office visit: 2  Interim History: Michaela Hanson is doing well. She is down 25 lbs since February 26, 2021. Her hunger is satisfied.  She is drinking plenty of water. She is doing a good job with protein intake. She is very hard on herself and feels she should be losing faster.  Subjective:   1. Pre-diabetes [name]'s last A1c was elevated at 6.2. She is not on Metformin.  Lab Results  Component Value Date   HGBA1C 6.3 (H) 05/28/2021   Lab Results  Component Value Date   INSULIN 8.4 05/28/2021   INSULIN 15.7 02/26/2021   2. Vitamin D deficiency She is currently taking prescription vitamin D 50,000 IU each week. Her Vit D level is low at 32.6.  Lab Results  Component Value Date   VD25OH 50.0 05/28/2021   VD25OH 32.6 02/26/2021   VD25OH 21.0 (L) 11/28/2019   3. Iron deficiency anemia, unspecified iron deficiency anemia type Michaela Hanson is on OTC iron 3 times a week. She says she has had iron deficiency anemia all of her life. Her colonoscopy is up to date.  CBC Latest Ref Rng & Units 05/28/2021 02/26/2021 11/28/2019  WBC 3.4 - 10.8 x10E3/uL 5.4 5.2 3.4  Hemoglobin 11.1 - 15.9 g/dL 94.7 09.6 10.9(L)  Hematocrit 34.0 - 46.6 % 38.5 34.8 33.4(L)  Platelets 150 - 450 x10E3/uL 249 - 233   Lab Results  Component Value Date   IRON 60 05/28/2021   TIBC 358 05/28/2021   FERRITIN 64 05/28/2021   Lab Results  Component Value Date   VITAMINB12 998 05/28/2021   4. Other  hyperlipidemia Michaela Hanson's last LDL was elevated at 125. Her HDL and triglycerides were within normal limits. She is not on a statin.  Lab Results  Component Value Date   ALT 12 05/28/2021   AST 18 05/28/2021   ALKPHOS 89 05/28/2021   BILITOT 0.6 05/28/2021   Lab Results  Component Value Date   CHOL 176 05/28/2021   HDL 55 05/28/2021   LDLCALC 113 (H) 05/28/2021   TRIG 36 05/28/2021   CHOLHDL 2.9 11/28/2019   5. At risk for activity intolerance Michaela Hanson is at risk for exercise intolerance due to right ankle pain.  Assessment/Plan:   1. Pre-diabetes Michaela Hanson will continue to work on weight loss, exercise, and decreasing simple carbohydrates to help decrease the risk of diabetes. Check labs today.  - Hemoglobin A1c - Insulin, random - Comprehensive metabolic panel  2. Vitamin D deficiency  She agrees to continue to take prescription Vitamin D @50 ,000 IU every week and will follow-up for routine testing of Vitamin D, at least 2-3 times per year to avoid over-replacement. Check labs today.  - VITAMIN D 25 Hydroxy (Vit-D Deficiency, Fractures)  3. Iron deficiency anemia, unspecified iron deficiency anemia type Continue OTC iron supplement. Check labs today.  - Vitamin B12 - Folate - Iron and TIBC - Ferritin - CBC with Differential/Platelet  4. Other hyperlipidemia Check labs today.  - Lipid Panel  With LDL/HDL Ratio  5. At risk for activity intolerance Michaela Hanson was given approximately 15 minutes of exercise intolerance counseling today. She is 55 y.o. female and has risk factors exercise intolerance including obesity. We discussed intensive lifestyle modifications today with an emphasis on specific weight loss instructions and strategies. Michaela Hanson will slowly increase activity as tolerated.  Repetitive spaced learning was employed today to elicit superior memory formation and behavioral change.  6. Obesity, current BMI 35.44  Michaela Hanson is currently in the action stage  of change. As such, her goal is to continue with weight loss efforts. She has agreed to the Category 2 Plan and keeping a food journal and adhering to recommended goals of 400-500 calories and 35 g protein with supper.   May journal at supper Handout: Recipes Reassurance provided that she is doing very well.  Exercise goals:  As is- Discussed adding resistance training 2-3 times a week.   Behavioral modification strategies: meal planning and cooking strategies.  Michaela Hanson has agreed to follow-up with our clinic in 2-3 weeks.  Michaela Hanson was informed we would discuss her lab results at her next visit unless there is a critical issue that needs to be addressed sooner. Michaela Hanson agreed to keep her next visit at the agreed upon time to discuss these results.  Objective:   Blood pressure 114/81, pulse 77, temperature 97.9 F (36.6 C), height 5\' 8"  (1.727 m), weight 233 lb (105.7 kg), SpO2 98 %. Body mass index is 35.43 kg/m.  General: Cooperative, alert, well developed, in no acute distress. HEENT: Conjunctivae and lids unremarkable. Cardiovascular: Regular rhythm.  Lungs: Normal work of breathing. Neurologic: No focal deficits.   Lab Results  Component Value Date   CREATININE 0.95 05/28/2021   BUN 16 05/28/2021   NA 140 05/28/2021   K 4.5 05/28/2021   CL 99 05/28/2021   CO2 25 05/28/2021   Lab Results  Component Value Date   ALT 12 05/28/2021   AST 18 05/28/2021   ALKPHOS 89 05/28/2021   BILITOT 0.6 05/28/2021   Lab Results  Component Value Date   HGBA1C 6.3 (H) 05/28/2021   HGBA1C 6.2 (H) 02/26/2021   HGBA1C 5.7 (H) 11/28/2019   Lab Results  Component Value Date   INSULIN 8.4 05/28/2021   INSULIN 15.7 02/26/2021   Lab Results  Component Value Date   TSH 3.720 02/26/2021   Lab Results  Component Value Date   CHOL 176 05/28/2021   HDL 55 05/28/2021   LDLCALC 113 (H) 05/28/2021   TRIG 36 05/28/2021   CHOLHDL 2.9 11/28/2019   Lab Results  Component Value Date    VD25OH 50.0 05/28/2021   VD25OH 32.6 02/26/2021   VD25OH 21.0 (L) 11/28/2019   Lab Results  Component Value Date   WBC 5.4 05/28/2021   HGB 12.4 05/28/2021   HCT 38.5 05/28/2021   MCV 91 05/28/2021   PLT 249 05/28/2021   Lab Results  Component Value Date   IRON 60 05/28/2021   TIBC 358 05/28/2021   FERRITIN 64 05/28/2021   Attestation Statements:   Reviewed by clinician on day of visit: allergies, medications, problem list, medical history, surgical history, family history, social history, and previous encounter notes.  05/30/2021, CMA, am acting as Edmund Hilda for Energy manager, FNP.  I have reviewed the above documentation for accuracy and completeness, and I agree with the above. -  [place], FNP

## 2021-06-18 ENCOUNTER — Other Ambulatory Visit: Payer: Self-pay

## 2021-06-18 ENCOUNTER — Encounter (INDEPENDENT_AMBULATORY_CARE_PROVIDER_SITE_OTHER): Payer: Self-pay | Admitting: Family Medicine

## 2021-06-18 ENCOUNTER — Ambulatory Visit (INDEPENDENT_AMBULATORY_CARE_PROVIDER_SITE_OTHER): Payer: BC Managed Care – PPO | Admitting: Family Medicine

## 2021-06-18 VITALS — BP 115/77 | HR 74 | Temp 97.7°F | Ht 68.0 in | Wt 230.0 lb

## 2021-06-18 DIAGNOSIS — R7303 Prediabetes: Secondary | ICD-10-CM

## 2021-06-18 DIAGNOSIS — E7849 Other hyperlipidemia: Secondary | ICD-10-CM

## 2021-06-18 DIAGNOSIS — Z6839 Body mass index (BMI) 39.0-39.9, adult: Secondary | ICD-10-CM | POA: Diagnosis not present

## 2021-06-18 NOTE — Progress Notes (Signed)
Chief Complaint:   OBESITY Michaela Hanson is here to discuss her progress with her obesity treatment plan along with follow-up of her obesity related diagnoses. Michaela Hanson is on the Category 2 Plan and keeping a food journal and adhering to recommended goals of 400-500 calories and 35 grams of protein with supper and states she is following her eating plan approximately 90% of the time. Michaela Hanson states she is walking for 20 minutes 3 times per week.  Today's visit was #: 7 Starting weight: 258 lbs Starting date: 02/26/2021 Today's weight: 230 lbs Today's date: 06/18/2021 Total lbs lost to date: 28 lbs Total lbs lost since last in-office visit: 3 lbs  Interim History: Michaela Hanson went on a few trips over the past few weeks but still lost 3 lbs. She is counting carbs and keeping them under 75 per day. She is very concerned about her recent A1C test result.  Subjective:   1. Pre-diabetes Michaela Hanson's A1C has increased to 6.3 from 6.2. She does not want to start Metformin. She has no history of diabetes mellitus.   Lab Results  Component Value Date   HGBA1C 6.3 (H) 05/28/2021   Lab Results  Component Value Date   INSULIN 8.4 05/28/2021   INSULIN 15.7 02/26/2021    2. Other hyperlipidemia Michaela Hanson's LDL improved to 113 from 125. Her HDL and Triglycerides are at goal. She is not on medication for hyperlipidemia.  Lab Results  Component Value Date   CHOL 176 05/28/2021   HDL 55 05/28/2021   LDLCALC 113 (H) 05/28/2021   TRIG 36 05/28/2021   CHOLHDL 2.9 11/28/2019   Lab Results  Component Value Date   ALT 12 05/28/2021   AST 18 05/28/2021   ALKPHOS 89 05/28/2021   BILITOT 0.6 05/28/2021   The 10-year ASCVD risk score Denman George DC Jr., et al., 2013) is: 2.6%   Values used to calculate the score:     Age: 55 years     Sex: Female     Is Non-Hispanic African American: Yes     Diabetic: No     Tobacco smoker: No     Systolic Blood Pressure: 115 mmHg     Is BP treated: Yes     HDL  Cholesterol: 55 mg/dL     Total Cholesterol: 176 mg/dL   Assessment/Plan:   1. Pre-diabetes Michaela Hanson will continue to keep carbs less than 75 per day. She will continue to work on weight loss, exercise, and decreasing simple carbohydrates to help decrease the risk of diabetes.    2. Other hyperlipidemia Michaela Hanson will continue to meal plan and increase exercise. Cardiovascular risk and specific lipid/LDL goals reviewed.  We discussed several lifestyle modifications today and Michaela Hanson will continue to work on diet, exercise and weight loss efforts.   3. Obesity, current BMI 34.98 Michaela Hanson is currently in the action stage of change. As such, her goal is to continue with weight loss efforts. She has agreed to the Category 2 Plan and keeping a food journal and adhering to recommended goals of 400-500 calories and 35 grams of protein.   Michaela Hanson will count carbs and keep it less than 75 per day.  Exercise goals:  Michaela Hanson will increase exercise to 150 minutes per week.  Behavioral modification strategies: decreasing simple carbohydrates and planning for success.  Michaela Hanson has agreed to follow-up with our clinic in 3 weeks.  Objective:   Blood pressure 115/77, pulse 74, temperature 97.7 F (36.5 C), height 5\' 8"  (1.727 m), weight 230  lb (104.3 kg), SpO2 99 %. Body mass index is 34.97 kg/m.  General: Cooperative, alert, well developed, in no acute distress. HEENT: Conjunctivae and lids unremarkable. Cardiovascular: Regular rhythm.  Lungs: Normal work of breathing. Neurologic: No focal deficits.   Lab Results  Component Value Date   CREATININE 0.95 05/28/2021   BUN 16 05/28/2021   NA 140 05/28/2021   K 4.5 05/28/2021   CL 99 05/28/2021   CO2 25 05/28/2021   Lab Results  Component Value Date   ALT 12 05/28/2021   AST 18 05/28/2021   ALKPHOS 89 05/28/2021   BILITOT 0.6 05/28/2021   Lab Results  Component Value Date   HGBA1C 6.3 (H) 05/28/2021   HGBA1C 6.2 (H) 02/26/2021    HGBA1C 5.7 (H) 11/28/2019   Lab Results  Component Value Date   INSULIN 8.4 05/28/2021   INSULIN 15.7 02/26/2021   Lab Results  Component Value Date   TSH 3.720 02/26/2021   Lab Results  Component Value Date   CHOL 176 05/28/2021   HDL 55 05/28/2021   LDLCALC 113 (H) 05/28/2021   TRIG 36 05/28/2021   CHOLHDL 2.9 11/28/2019   Lab Results  Component Value Date   VD25OH 50.0 05/28/2021   VD25OH 32.6 02/26/2021   VD25OH 21.0 (L) 11/28/2019   Lab Results  Component Value Date   WBC 5.4 05/28/2021   HGB 12.4 05/28/2021   HCT 38.5 05/28/2021   MCV 91 05/28/2021   PLT 249 05/28/2021   Lab Results  Component Value Date   IRON 60 05/28/2021   TIBC 358 05/28/2021   FERRITIN 64 05/28/2021   Attestation Statements:   Reviewed by clinician on day of visit: allergies, medications, problem list, medical history, surgical history, family history, social history, and previous encounter notes.  Time spent on visit including pre-visit chart review and post-visit care and charting was 31 minutes.   I, Jackson Latino, RMA, am acting as Energy manager for [place], FNP.   I have reviewed the above documentation for accuracy and completeness, and I agree with the above. -  Jesse Sans, FNP

## 2021-07-08 ENCOUNTER — Ambulatory Visit (INDEPENDENT_AMBULATORY_CARE_PROVIDER_SITE_OTHER): Payer: BC Managed Care – PPO | Admitting: Adult Health

## 2021-07-10 ENCOUNTER — Encounter (INDEPENDENT_AMBULATORY_CARE_PROVIDER_SITE_OTHER): Payer: Self-pay | Admitting: Adult Health

## 2021-07-10 ENCOUNTER — Ambulatory Visit (INDEPENDENT_AMBULATORY_CARE_PROVIDER_SITE_OTHER): Payer: BC Managed Care – PPO | Admitting: Adult Health

## 2021-07-10 ENCOUNTER — Ambulatory Visit (INDEPENDENT_AMBULATORY_CARE_PROVIDER_SITE_OTHER): Payer: BC Managed Care – PPO | Admitting: Family Medicine

## 2021-07-10 ENCOUNTER — Other Ambulatory Visit: Payer: Self-pay

## 2021-07-10 VITALS — BP 114/75 | HR 97 | Temp 98.3°F | Ht 68.0 in | Wt 227.0 lb

## 2021-07-10 DIAGNOSIS — I10 Essential (primary) hypertension: Secondary | ICD-10-CM | POA: Diagnosis not present

## 2021-07-10 DIAGNOSIS — R7303 Prediabetes: Secondary | ICD-10-CM | POA: Diagnosis not present

## 2021-07-10 DIAGNOSIS — Z6839 Body mass index (BMI) 39.0-39.9, adult: Secondary | ICD-10-CM

## 2021-07-11 NOTE — Progress Notes (Signed)
Chief Complaint:   OBESITY Michaela Hanson is here to discuss her progress with her obesity treatment plan along with follow-up of her obesity related diagnoses. Michaela Hanson is on the Category 2 Plan and keeping a food journal and adhering to recommended goals of 400-500 calories and 35 grams of protein and states she is following her eating plan approximately 90-95% of the time. Michaela Hanson states she is walking for 30 minutes 3 times per week.  Today's visit was #: 8 Starting weight: 258 lbs Starting date: 02/26/2021 Today's weight: 227 lbs Today's date: 07/10/2021 Total lbs lost to date: 31 lbs Total lbs lost since last in-office visit: 3 lbs  Interim History: Michaela Hanson will consume breakfast between 0715-0730 at home, snack between 1000-1030 at work. Consume lunch at 1300-1400, afternoon snack at 1600-1700, and eat dinner 1915 - all at home.  Reviewed body composition with patient.  Subjective:   1. Pre-diabetes On 05/28/2021, A1c was 6.3, up from 6.2 on 02/26/2021.  Lab Results  Component Value Date   HGBA1C 6.3 (H) 05/28/2021   Lab Results  Component Value Date   INSULIN 8.4 05/28/2021   INSULIN 15.7 02/26/2021   2. Essential hypertension BP stable at office visit.  She denies dizziness or fatigue. She is on HCTZ 25 mg 1/2 tablet daily.  BP Readings from Last 3 Encounters:  07/10/21 114/75  06/18/21 115/77  05/28/21 114/81   Assessment/Plan:   1. Pre-diabetes Continue to increase protein and regular walking.  2. Essential hypertension Monitor BP at home.  Monitor for symptoms of hypotension.   3. Obesity, current BMI 34.5  Michaela Hanson is currently in the action stage of change. As such, her goal is to continue with weight loss efforts. She has agreed to the Category 2 Plan and keeping a food journal and adhering to recommended goals of 400-500 calories and 35 grams of protein.   Exercise goals:  As is.  Behavioral modification strategies: increasing lean protein  intake, decreasing simple carbohydrates, meal planning and cooking strategies, keeping healthy foods in the home, and planning for success.  Michaela Hanson has agreed to follow-up with our clinic in 2 weeks. She was informed of the importance of frequent follow-up visits to maximize her success with intensive lifestyle modifications for her multiple health conditions.   Objective:   Blood pressure 114/75, pulse 97, temperature 98.3 F (36.8 C), height 5\' 8"  (1.727 m), weight 227 lb (103 kg), SpO2 100 %. Body mass index is 34.52 kg/m.  General: Cooperative, alert, well developed, in no acute distress. HEENT: Conjunctivae and lids unremarkable. Cardiovascular: Regular rhythm.  Lungs: Normal work of breathing. Neurologic: No focal deficits.   Lab Results  Component Value Date   CREATININE 0.95 05/28/2021   BUN 16 05/28/2021   NA 140 05/28/2021   K 4.5 05/28/2021   CL 99 05/28/2021   CO2 25 05/28/2021   Lab Results  Component Value Date   ALT 12 05/28/2021   AST 18 05/28/2021   ALKPHOS 89 05/28/2021   BILITOT 0.6 05/28/2021   Lab Results  Component Value Date   HGBA1C 6.3 (H) 05/28/2021   HGBA1C 6.2 (H) 02/26/2021   HGBA1C 5.7 (H) 11/28/2019   Lab Results  Component Value Date   INSULIN 8.4 05/28/2021   INSULIN 15.7 02/26/2021   Lab Results  Component Value Date   TSH 3.720 02/26/2021   Lab Results  Component Value Date   CHOL 176 05/28/2021   HDL 55 05/28/2021   LDLCALC 113 (H) 05/28/2021  TRIG 36 05/28/2021   CHOLHDL 2.9 11/28/2019   Lab Results  Component Value Date   VD25OH 50.0 05/28/2021   VD25OH 32.6 02/26/2021   VD25OH 21.0 (L) 11/28/2019   Lab Results  Component Value Date   WBC 5.4 05/28/2021   HGB 12.4 05/28/2021   HCT 38.5 05/28/2021   MCV 91 05/28/2021   PLT 249 05/28/2021   Lab Results  Component Value Date   IRON 60 05/28/2021   TIBC 358 05/28/2021   FERRITIN 64 05/28/2021   Attestation Statements:   Reviewed by clinician on day of  visit: allergies, medications, problem list, medical history, surgical history, family history, social history, and previous encounter notes.  Time spent on visit including pre-visit chart review and post-visit care and charting was 25 minutes.   I, Insurance claims handler, CMA, am acting as Energy manager for William Hamburger, NP.  I have reviewed the above documentation for accuracy and completeness, and I agree with the above. -  Michaela Simon d. Birdell Frasier, NP-C

## 2021-07-23 ENCOUNTER — Encounter (INDEPENDENT_AMBULATORY_CARE_PROVIDER_SITE_OTHER): Payer: Self-pay | Admitting: Family Medicine

## 2021-07-24 NOTE — Telephone Encounter (Signed)
Pt last seen by Dawn Whitmire, FNP.  

## 2021-07-24 NOTE — Telephone Encounter (Signed)
CORRECTION: Pt last seen by William Hamburger, FNP.

## 2021-07-29 ENCOUNTER — Ambulatory Visit (INDEPENDENT_AMBULATORY_CARE_PROVIDER_SITE_OTHER): Payer: BC Managed Care – PPO | Admitting: Family Medicine

## 2021-07-31 ENCOUNTER — Ambulatory Visit (INDEPENDENT_AMBULATORY_CARE_PROVIDER_SITE_OTHER): Payer: BC Managed Care – PPO | Admitting: Physician Assistant

## 2021-08-01 ENCOUNTER — Other Ambulatory Visit: Payer: Self-pay

## 2021-08-01 ENCOUNTER — Encounter (INDEPENDENT_AMBULATORY_CARE_PROVIDER_SITE_OTHER): Payer: Self-pay | Admitting: Family Medicine

## 2021-08-01 ENCOUNTER — Ambulatory Visit (INDEPENDENT_AMBULATORY_CARE_PROVIDER_SITE_OTHER): Payer: BC Managed Care – PPO | Admitting: Family Medicine

## 2021-08-01 VITALS — BP 110/74 | HR 74 | Temp 97.9°F | Ht 68.0 in | Wt 224.0 lb

## 2021-08-01 DIAGNOSIS — I10 Essential (primary) hypertension: Secondary | ICD-10-CM

## 2021-08-01 DIAGNOSIS — Z9189 Other specified personal risk factors, not elsewhere classified: Secondary | ICD-10-CM

## 2021-08-01 DIAGNOSIS — E559 Vitamin D deficiency, unspecified: Secondary | ICD-10-CM | POA: Diagnosis not present

## 2021-08-01 DIAGNOSIS — Z6839 Body mass index (BMI) 39.0-39.9, adult: Secondary | ICD-10-CM

## 2021-08-01 MED ORDER — ERGOCALCIFEROL 1.25 MG (50000 UT) PO CAPS: 50000.0000 [IU] | ORAL_CAPSULE | ORAL | 0 refills | Status: DC

## 2021-08-05 NOTE — Progress Notes (Signed)
Chief Complaint:   OBESITY Michaela Hanson is here to discuss her progress with her obesity treatment plan along with follow-up of her obesity related diagnoses. Michaela Hanson is on the Category 2 Plan and keeping a food journal and adhering to recommended goals of 400-500 calories and 35 grams of protein daily and states she is following her eating plan approximately 85% of the time. Michaela Hanson states she is walking for 30 minutes 2 times per week.  Today's visit was #: 9 Starting weight: 258 lbs Starting date: 02/26/2021 Today's weight: 224 lbs Today's date: 08/01/2021 Total lbs lost to date: 34 Total lbs lost since last in-office visit: 3  Interim History: Michaela Hanson continues to do very well with weight loss. She has has a family friend pass away, and she has been off track but mindful.  Subjective:   1. Vitamin D deficiency Michaela Hanson is off her Vit D prescription, and she had been taking OTC Vit D but fatigue has restarted.  2. Essential hypertension Michaela Hanson's blood pressure is well controlled on her medications, and with diet, exercise, and weight loss.  3. At risk for impaired metabolic function Michaela Hanson is at increased risk for impaired metabolic function if calories or protein decreases.  Assessment/Plan:   1. Vitamin D deficiency Low Vitamin D level contributes to fatigue and are associated with obesity, breast, and colon cancer. Michaela Hanson agreed to restart prescription Vitamin D 50,000 IU every week with no refills, and she will discontinue OTC Vit D. She will follow-up for routine testing of Vitamin D, at least 2-3 times per year to avoid over-replacement.  - ergocalciferol (VITAMIN D2) 1.25 MG (50000 UT) capsule; Take 1 capsule (50,000 Units total) by mouth once a week.  Dispense: 4 capsule; Refill: 0  2. Essential hypertension Michaela Hanson will continue hydrochlorothiazide, and will will watch for signs of hypotension as she continues her lifestyle modifications.  3. At risk for  impaired metabolic function Michaela Hanson was given approximately 15 minutes of impaired  metabolic function prevention counseling today. We discussed intensive lifestyle modifications today with an emphasis on specific nutrition and exercise instructions and strategies.   Repetitive spaced learning was employed today to elicit superior memory formation and behavioral change.  4. Obesity, current BMI 34.1 Michaela Hanson is currently in the action stage of change. As such, her goal is to continue with weight loss efforts. She has agreed to the Category 2 Plan and keeping a food journal and adhering to recommended goals of 400-500 calories and 35+ grams of protein at supper daily.   Exercise goals: As is.  Behavioral modification strategies: increasing lean protein intake.  Michaela Hanson has agreed to follow-up with our clinic in 3 weeks. She was informed of the importance of frequent follow-up visits to maximize her success with intensive lifestyle modifications for her multiple health conditions.   Objective:   Blood pressure 110/74, pulse 74, temperature 97.9 F (36.6 C), height 5\' 8"  (1.727 m), weight 224 lb (101.6 kg), SpO2 97 %. Body mass index is 34.06 kg/m.  General: Cooperative, alert, well developed, in no acute distress. HEENT: Conjunctivae and lids unremarkable. Cardiovascular: Regular rhythm.  Lungs: Normal work of breathing. Neurologic: No focal deficits.   Lab Results  Component Value Date   CREATININE 0.95 05/28/2021   BUN 16 05/28/2021   NA 140 05/28/2021   K 4.5 05/28/2021   CL 99 05/28/2021   CO2 25 05/28/2021   Lab Results  Component Value Date   ALT 12 05/28/2021   AST 18 05/28/2021  ALKPHOS 89 05/28/2021   BILITOT 0.6 05/28/2021   Lab Results  Component Value Date   HGBA1C 6.3 (H) 05/28/2021   HGBA1C 6.2 (H) 02/26/2021   HGBA1C 5.7 (H) 11/28/2019   Lab Results  Component Value Date   INSULIN 8.4 05/28/2021   INSULIN 15.7 02/26/2021   Lab Results  Component  Value Date   TSH 3.720 02/26/2021   Lab Results  Component Value Date   CHOL 176 05/28/2021   HDL 55 05/28/2021   LDLCALC 113 (H) 05/28/2021   TRIG 36 05/28/2021   CHOLHDL 2.9 11/28/2019   Lab Results  Component Value Date   VD25OH 50.0 05/28/2021   VD25OH 32.6 02/26/2021   VD25OH 21.0 (L) 11/28/2019   Lab Results  Component Value Date   WBC 5.4 05/28/2021   HGB 12.4 05/28/2021   HCT 38.5 05/28/2021   MCV 91 05/28/2021   PLT 249 05/28/2021   Lab Results  Component Value Date   IRON 60 05/28/2021   TIBC 358 05/28/2021   FERRITIN 64 05/28/2021   Attestation Statements:   Reviewed by clinician on day of visit: allergies, medications, problem list, medical history, surgical history, family history, social history, and previous encounter notes.   I, Burt Knack, am acting as transcriptionist for Quillian Quince, MD.  I have reviewed the above documentation for accuracy and completeness, and I agree with the above. - Quillian Quince, MD

## 2021-08-20 ENCOUNTER — Ambulatory Visit (INDEPENDENT_AMBULATORY_CARE_PROVIDER_SITE_OTHER): Payer: BC Managed Care – PPO | Admitting: Adult Health

## 2021-08-21 ENCOUNTER — Ambulatory Visit (INDEPENDENT_AMBULATORY_CARE_PROVIDER_SITE_OTHER): Payer: BC Managed Care – PPO | Admitting: Family Medicine

## 2021-09-05 ENCOUNTER — Encounter (INDEPENDENT_AMBULATORY_CARE_PROVIDER_SITE_OTHER): Payer: Self-pay | Admitting: Family Medicine

## 2021-09-05 NOTE — Telephone Encounter (Signed)
Last OV with Dr. Beasley 

## 2021-09-11 ENCOUNTER — Ambulatory Visit (INDEPENDENT_AMBULATORY_CARE_PROVIDER_SITE_OTHER): Payer: BC Managed Care – PPO | Admitting: Family Medicine

## 2021-09-11 ENCOUNTER — Encounter (INDEPENDENT_AMBULATORY_CARE_PROVIDER_SITE_OTHER): Payer: Self-pay | Admitting: Family Medicine

## 2021-09-11 ENCOUNTER — Other Ambulatory Visit: Payer: Self-pay

## 2021-09-11 VITALS — BP 126/82 | HR 84 | Temp 98.1°F | Ht 68.0 in | Wt 228.0 lb

## 2021-09-11 DIAGNOSIS — R7303 Prediabetes: Secondary | ICD-10-CM | POA: Diagnosis not present

## 2021-09-11 DIAGNOSIS — E559 Vitamin D deficiency, unspecified: Secondary | ICD-10-CM | POA: Diagnosis not present

## 2021-09-11 DIAGNOSIS — Z6839 Body mass index (BMI) 39.0-39.9, adult: Secondary | ICD-10-CM | POA: Diagnosis not present

## 2021-09-11 MED ORDER — ERGOCALCIFEROL 1.25 MG (50000 UT) PO CAPS: 50000.0000 [IU] | ORAL_CAPSULE | ORAL | 0 refills | Status: DC

## 2021-09-11 NOTE — Progress Notes (Signed)
Chief Complaint:   OBESITY Michaela Michaela is here to discuss her progress with her obesity treatment plan along with follow-up of her obesity related diagnoses. Michaela Michaela is on the Category 2 Plan and keeping a food journal and adhering to recommended goals of 400-500 calories and 35+ grams of protein daily and states she is following her eating plan approximately 30% of the time. Michaela Michaela states she is walking for 15-20 minutes 2-3 times per week.  Today's visit was #: 10 Starting weight: 258 lbs Starting date: 02/26/2021 Today's weight: 228 lbs Today's date: 09/11/2021 Total lbs lost to date: 30 Total lbs lost since last in-office visit: 0  Interim History: Michaela Michaela has gotten off track with her eating plan. She has done very well overall, but after eating comfort food she is struggling to get back on track. She is especially bored with her breakfast options.  Subjective:   1. Pre-diabetes Michaela Michaela continues to work on diet and exercise. Her A1c was 6.3 in July and she is due to have it rechecked soon. I discussed labs with the patient today.  2. Michaela Michaela deficiency Michaela Michaela is stable on Vit Hanson, and she denies signs of over-replacement.  Assessment/Plan:   1. Pre-diabetes Michaela Michaela will continue with diet, exercise, and weight loss to help decrease the risk of diabetes. We will recheck labs in 2 week.  2. Michaela Michaela deficiency Michaela Michaela Michaela level contributes to fatigue and are associated with obesity, breast, and colon cancer. We will refill prescription Michaela Michaela 50,000 IU every week #4 for 1 month. Michaela Michaela will follow-up for routine testing of Michaela Michaela, at least 2-3 times per year to avoid over-replacement.  3. Obesity BMI today is 89 Michaela Michaela is currently in the action stage of change. As such, her goal is to continue with weight loss efforts. She has agreed to the Category 2 Plan.   We will recheck fasting labs at her next visit.  Extra breakfast options were  given.  Exercise goals: As is.  Behavioral modification strategies: no skipping meals, meal planning and cooking strategies, and planning for success.  Michaela Hanson has agreed to follow-up with our clinic in 2 weeks. She was informed of the importance of frequent follow-up visits to maximize her success with intensive lifestyle modifications for her multiple health conditions.   Objective:   Blood pressure 126/82, pulse 84, temperature 98.1 F (36.7 C), height 5\' 8"  (1.727 m), weight 228 lb (103.4 kg), SpO2 98 %. Body mass index is 34.67 kg/m.  General: Cooperative, alert, well developed, in no acute distress. HEENT: Conjunctivae and lids unremarkable. Cardiovascular: Regular rhythm.  Lungs: Normal work of breathing. Neurologic: No focal deficits.   Lab Results  Component Value Date   CREATININE 0.95 05/28/2021   BUN 16 05/28/2021   NA 140 05/28/2021   K 4.5 05/28/2021   CL 99 05/28/2021   CO2 25 05/28/2021   Lab Results  Component Value Date   ALT 12 05/28/2021   AST 18 05/28/2021   ALKPHOS 89 05/28/2021   BILITOT 0.6 05/28/2021   Lab Results  Component Value Date   HGBA1C 6.3 (H) 05/28/2021   HGBA1C 6.2 (H) 02/26/2021   HGBA1C 5.7 (H) 11/28/2019   Lab Results  Component Value Date   INSULIN 8.4 05/28/2021   INSULIN 15.7 02/26/2021   Lab Results  Component Value Date   TSH 3.720 02/26/2021   Lab Results  Component Value Date   CHOL 176 05/28/2021   HDL 55 05/28/2021  LDLCALC 113 (H) 05/28/2021   TRIG 36 05/28/2021   CHOLHDL 2.9 11/28/2019   Lab Results  Component Value Date   VD25OH 50.0 05/28/2021   VD25OH 32.6 02/26/2021   VD25OH 21.0 (L) 11/28/2019   Lab Results  Component Value Date   WBC 5.4 05/28/2021   HGB 12.4 05/28/2021   HCT 38.5 05/28/2021   MCV 91 05/28/2021   PLT 249 05/28/2021   Lab Results  Component Value Date   IRON 60 05/28/2021   TIBC 358 05/28/2021   FERRITIN 64 05/28/2021   Attestation Statements:   Reviewed by  clinician on day of visit: allergies, medications, problem list, medical history, surgical history, family history, social history, and previous encounter notes.  Time spent on visit including pre-visit chart review and post-visit care and charting was 32 minutes.    I, Burt Knack, am acting as transcriptionist for Quillian Quince, MD.  I have reviewed the above documentation for accuracy and completeness, and I agree with the above. -  Quillian Quince, MD

## 2021-09-26 ENCOUNTER — Other Ambulatory Visit: Payer: Self-pay

## 2021-09-26 ENCOUNTER — Ambulatory Visit (INDEPENDENT_AMBULATORY_CARE_PROVIDER_SITE_OTHER): Payer: BC Managed Care – PPO | Admitting: Family Medicine

## 2021-09-26 ENCOUNTER — Encounter (INDEPENDENT_AMBULATORY_CARE_PROVIDER_SITE_OTHER): Payer: Self-pay | Admitting: Family Medicine

## 2021-09-26 VITALS — BP 116/78 | HR 73 | Temp 97.8°F | Ht 68.0 in | Wt 228.0 lb

## 2021-09-26 DIAGNOSIS — D508 Other iron deficiency anemias: Secondary | ICD-10-CM | POA: Diagnosis not present

## 2021-09-26 DIAGNOSIS — R7303 Prediabetes: Secondary | ICD-10-CM

## 2021-09-26 DIAGNOSIS — I10 Essential (primary) hypertension: Secondary | ICD-10-CM | POA: Diagnosis not present

## 2021-09-26 DIAGNOSIS — E559 Vitamin D deficiency, unspecified: Secondary | ICD-10-CM | POA: Diagnosis not present

## 2021-09-26 DIAGNOSIS — Z9189 Other specified personal risk factors, not elsewhere classified: Secondary | ICD-10-CM | POA: Diagnosis not present

## 2021-09-26 DIAGNOSIS — Z6839 Body mass index (BMI) 39.0-39.9, adult: Secondary | ICD-10-CM

## 2021-09-26 MED ORDER — ERGOCALCIFEROL 1.25 MG (50000 UT) PO CAPS: 50000.0000 [IU] | ORAL_CAPSULE | ORAL | 0 refills | Status: DC

## 2021-09-27 LAB — CBC WITH DIFFERENTIAL/PLATELET
Basophils Absolute: 0 10*3/uL (ref 0.0–0.2)
Basos: 1 %
EOS (ABSOLUTE): 0 10*3/uL (ref 0.0–0.4)
Eos: 1 %
Hematocrit: 36.3 % (ref 34.0–46.6)
Hemoglobin: 12.4 g/dL (ref 11.1–15.9)
Immature Grans (Abs): 0 10*3/uL (ref 0.0–0.1)
Immature Granulocytes: 0 %
Lymphocytes Absolute: 1.6 10*3/uL (ref 0.7–3.1)
Lymphs: 47 %
MCH: 30.4 pg (ref 26.6–33.0)
MCHC: 34.2 g/dL (ref 31.5–35.7)
MCV: 89 fL (ref 79–97)
Monocytes Absolute: 0.2 10*3/uL (ref 0.1–0.9)
Monocytes: 6 %
Neutrophils Absolute: 1.5 10*3/uL (ref 1.4–7.0)
Neutrophils: 45 %
Platelets: 203 10*3/uL (ref 150–450)
RBC: 4.08 x10E6/uL (ref 3.77–5.28)
RDW: 13.6 % (ref 11.7–15.4)
WBC: 3.4 10*3/uL (ref 3.4–10.8)

## 2021-09-27 LAB — VITAMIN D 25 HYDROXY (VIT D DEFICIENCY, FRACTURES): Vit D, 25-Hydroxy: 40.7 ng/mL (ref 30.0–100.0)

## 2021-09-27 LAB — INSULIN, RANDOM: INSULIN: 7.5 u[IU]/mL (ref 2.6–24.9)

## 2021-09-27 LAB — HEMOGLOBIN A1C
Est. average glucose Bld gHb Est-mCnc: 123 mg/dL
Hgb A1c MFr Bld: 5.9 % — ABNORMAL HIGH (ref 4.8–5.6)

## 2021-09-30 NOTE — Progress Notes (Signed)
Chief Complaint:   OBESITY Michaela Hanson is here to discuss her progress with her obesity treatment plan along with follow-up of her obesity related diagnoses. Michaela Hanson is on the Category 2 Plan and states she is following her eating plan approximately 75% of the time. Michaela Hanson states she is riding the stationary bike for 15-20 minutes 3 times per week.  Today's visit was #: 11 Starting weight: 258 lbs Starting date: 02/26/2021 Today's weight: 228 lbs Today's date: 09/26/2021 Total lbs lost to date: 30 Total lbs lost since last in-office visit: 0  Interim History: Michaela Hanson lost a close family friend in late August. She got off track but 2 weeks ago she got back on the meal plan. She wishes she could meal prep more which she hasn't been doing. She is still trying to exercise some. She has no questions or concerns about the meal plan.  Subjective:   1. Vitamin D deficiency Michaela Hanson is currently taking prescription vitamin D 50,000 IU each week. She denies nausea, vomiting or muscle weakness.  2. Essential hypertension Michaela Hanson is taking hydrochlorothiazide and her blood pressure is at goal. Cardiovascular ROS: no chest pain or dyspnea on exertion.  BP Readings from Last 3 Encounters:  09/26/21 116/78  09/11/21 126/82  08/01/21 110/74   3. Other iron deficiency anemia Michaela Hanson is taking FeSo, and she is not a vegetarian.  She does not have a history of weight loss surgery.   4. Pre-diabetes Michaela Hanson has a diagnosis of pre-diabetes based on her elevated HgA1c and was informed this puts her at greater risk of developing diabetes. She continues to work on diet and exercise to decrease her risk of diabetes. She denies nausea or hypoglycemia.  5. At risk for impaired metabolic function Michaela Hanson is at increased risk for impaired metabolic function due to history of obesity and pre-diabetes.  Assessment/Plan:   Orders Placed This Encounter  Procedures   VITAMIN D 25 Hydroxy (Vit-D  Deficiency, Fractures)   Hemoglobin A1c   Insulin, random   CBC with Differential/Platelet   Hemoglobin A1c   VITAMIN D 25 Hydroxy (Vit-D Deficiency, Fractures)   Insulin, random    Medications Discontinued During This Encounter  Medication Reason   ergocalciferol (VITAMIN D2) 1.25 MG (50000 UT) capsule Reorder     Meds ordered this encounter  Medications   ergocalciferol (VITAMIN D2) 1.25 MG (50000 UT) capsule    Sig: Take 1 capsule (50,000 Units total) by mouth once a week.    Dispense:  4 capsule    Refill:  0     1. Vitamin D deficiency Low Vitamin D level contributes to fatigue and are associated with obesity, breast, and colon cancer. We will check labs today, and we will refill prescription Vitamin D for 1 month. Michaela Hanson will follow-up for routine testing of Vitamin D, at least 2-3 times per year to avoid over-replacement.  - ergocalciferol (VITAMIN D2) 1.25 MG (50000 UT) capsule; Take 1 capsule (50,000 Units total) by mouth once a week.  Dispense: 4 capsule; Refill: 0 - VITAMIN D 25 Hydroxy (Vit-D Deficiency, Fractures)  2. Essential hypertension Michaela Hanson will continue working on healthy weight loss and exercise to improve blood pressure control. We will watch for signs of hypotension as she continues her lifestyle modifications.  3. Other iron deficiency anemia We will check labs today, and will follow up at Michaela Hanson's next office visit. Orders and follow up as documented in patient record.  Counseling Iron is essential for our bodies to make  red blood cells.  Reasons that someone may be deficient include: an iron-deficient diet (more likely in those following vegan or vegetarian diets), women with heavy menses, patients with GI disorders or poor absorption, patients that have had bariatric surgery, frequent blood donors, patients with cancer, and patients with heart disease.   Iron-rich foods include dark leafy greens, red and white meats, eggs, seafood, and beans.    Certain foods and drinks prevent your body from absorbing iron properly. Avoid eating these foods in the same meal as iron-rich foods or with iron supplements. These foods include: coffee, black tea, and red wine; milk, dairy products, and foods that are high in calcium; beans and soybeans; whole grains.  Constipation can be a side effect of iron supplementation. Increased water and fiber intake are helpful. Water goal: > 2 liters/day. Fiber goal: > 25 grams/day.  - CBC with Differential/Platelet  4. Pre-diabetes Michaela Hanson will continue to work on weight loss, exercise, and decreasing simple carbohydrates to help decrease the risk of diabetes. We will check labs today.  - Hemoglobin A1c - Insulin, random  5. At risk for impaired metabolic function Michaela Hanson was given approximately 8 minutes of impaired  metabolic function prevention counseling today. We discussed intensive lifestyle modifications today with an emphasis on specific nutrition and exercise instructions and strategies.   Repetitive spaced learning was employed today to elicit superior memory formation and behavioral change.  6. Obesity with current BMI of 34.8 Michaela Hanson is currently in the action stage of change. As such, her goal is to continue with weight loss efforts. She has agreed to the Category 2 Plan.   Thanksgiving day eating strategies were discussed with the patient and handouts were given.  Exercise goals: For substantial health benefits, adults should do at least 150 minutes (2 hours and 30 minutes) a week of moderate-intensity, or 75 minutes (1 hour and 15 minutes) a week of vigorous-intensity aerobic physical activity, or an equivalent combination of moderate- and vigorous-intensity aerobic activity. Aerobic activity should be performed in episodes of at least 10 minutes, and preferably, it should be spread throughout the week.  Behavioral modification strategies: increasing lean protein intake, decreasing simple  carbohydrates, and holiday eating strategies .  Michaela Hanson has agreed to follow-up with our clinic in 2 to 3 weeks. She was informed of the importance of frequent follow-up visits to maximize her success with intensive lifestyle modifications for her multiple health conditions.   Michaela Hanson was informed we would discuss her lab results at her next visit unless there is a critical issue that needs to be addressed sooner. Michaela Hanson agreed to keep her next visit at the agreed upon time to discuss these results.  Objective:   Blood pressure 116/78, pulse 73, temperature 97.8 F (36.6 C), height 5\' 8"  (1.727 m), weight 228 lb (103.4 kg), SpO2 99 %. Body mass index is 34.67 kg/m.  General: Cooperative, alert, well developed, in no acute distress. HEENT: Conjunctivae and lids unremarkable. Cardiovascular: Regular rhythm.  Lungs: Normal work of breathing. Neurologic: No focal deficits.   Lab Results  Component Value Date   CREATININE 0.95 05/28/2021   BUN 16 05/28/2021   NA 140 05/28/2021   K 4.5 05/28/2021   CL 99 05/28/2021   CO2 25 05/28/2021   Lab Results  Component Value Date   ALT 12 05/28/2021   AST 18 05/28/2021   ALKPHOS 89 05/28/2021   BILITOT 0.6 05/28/2021   Lab Results  Component Value Date   HGBA1C 5.9 (H) 09/26/2021  HGBA1C 6.3 (H) 05/28/2021   HGBA1C 6.2 (H) 02/26/2021   HGBA1C 5.7 (H) 11/28/2019   Lab Results  Component Value Date   INSULIN 7.5 09/26/2021   INSULIN 8.4 05/28/2021   INSULIN 15.7 02/26/2021   Lab Results  Component Value Date   TSH 3.720 02/26/2021   Lab Results  Component Value Date   CHOL 176 05/28/2021   HDL 55 05/28/2021   LDLCALC 113 (H) 05/28/2021   TRIG 36 05/28/2021   CHOLHDL 2.9 11/28/2019   Lab Results  Component Value Date   VD25OH 40.7 09/26/2021   VD25OH 50.0 05/28/2021   VD25OH 32.6 02/26/2021   Lab Results  Component Value Date   WBC 3.4 09/26/2021   HGB 12.4 09/26/2021   HCT 36.3 09/26/2021   MCV 89 09/26/2021    PLT 203 09/26/2021   Lab Results  Component Value Date   IRON 60 05/28/2021   TIBC 358 05/28/2021   FERRITIN 64 05/28/2021   Attestation Statements:   Reviewed by clinician on day of visit: allergies, medications, problem list, medical history, surgical history, family history, social history, and previous encounter notes.   Wilhemena Durie, am acting as transcriptionist for [employer], DO.  I have reviewed the above documentation for accuracy and completeness, and I agree with the above. Marjory Sneddon, D.O.  The [place] was signed into law in 2016 which includes the topic of electronic health records.  This provides immediate access to information in MyChart.  This includes consultation notes, operative notes, office notes, lab results and pathology reports.  If you have any questions about what you read please let us know at your next visit so we can discuss your concerns and take corrective action if need be.  We are right here with you.

## 2021-10-10 ENCOUNTER — Ambulatory Visit (INDEPENDENT_AMBULATORY_CARE_PROVIDER_SITE_OTHER): Payer: BC Managed Care – PPO | Admitting: Family Medicine

## 2021-10-28 ENCOUNTER — Ambulatory Visit (INDEPENDENT_AMBULATORY_CARE_PROVIDER_SITE_OTHER): Payer: BC Managed Care – PPO | Admitting: Family Medicine

## 2021-10-29 ENCOUNTER — Ambulatory Visit (INDEPENDENT_AMBULATORY_CARE_PROVIDER_SITE_OTHER): Payer: BC Managed Care – PPO | Admitting: Family Medicine

## 2021-10-30 ENCOUNTER — Encounter (INDEPENDENT_AMBULATORY_CARE_PROVIDER_SITE_OTHER): Payer: Self-pay | Admitting: Family Medicine

## 2021-10-30 ENCOUNTER — Ambulatory Visit (INDEPENDENT_AMBULATORY_CARE_PROVIDER_SITE_OTHER): Payer: BC Managed Care – PPO | Admitting: Family Medicine

## 2021-10-30 ENCOUNTER — Other Ambulatory Visit: Payer: Self-pay

## 2021-10-30 VITALS — BP 119/83 | HR 83 | Temp 97.6°F | Ht 68.0 in | Wt 233.0 lb

## 2021-10-30 DIAGNOSIS — Z6839 Body mass index (BMI) 39.0-39.9, adult: Secondary | ICD-10-CM | POA: Diagnosis not present

## 2021-10-30 DIAGNOSIS — E559 Vitamin D deficiency, unspecified: Secondary | ICD-10-CM

## 2021-10-30 DIAGNOSIS — Z9189 Other specified personal risk factors, not elsewhere classified: Secondary | ICD-10-CM | POA: Diagnosis not present

## 2021-10-30 DIAGNOSIS — I10 Essential (primary) hypertension: Secondary | ICD-10-CM

## 2021-10-30 MED ORDER — ERGOCALCIFEROL 1.25 MG (50000 UT) PO CAPS
50000.0000 [IU] | ORAL_CAPSULE | ORAL | 0 refills | Status: DC
Start: 2021-10-30 — End: 2021-11-21

## 2021-10-30 NOTE — Progress Notes (Signed)
Chief Complaint:   OBESITY Michaela Hanson is here to discuss her progress with her obesity treatment plan along with follow-up of her obesity related diagnoses. [name] is on the Category 2 Plan and states she is following her eating plan approximately 20% of the time. [name] states she is riding the stationary bike for 15-20 minutes 3 times per week.  Today's visit was #: 12 Starting weight: 258 lbs Starting date: 02/26/2021 Today's weight: 233 lbs Today's date: 10/30/2021 Total lbs lost to date: 25 Total lbs lost since last in-office visit: 0  Interim History: Michaela Hanson has had a lot of temptations in the last few weeks. She has tried to not have as many snacks at home.  Subjective:   1. Vitamin D deficiency Michaela Hanson's recent Bit D level decreased this Fall. She is no longer at goal. No side effects were noted.  2. Essential hypertension Michaela Hanson's blood pressure is stable on her medications with no side effects noted. She denies hypotension.  3. At risk for heart disease Michaela Hanson is at a higher than average risk for cardiovascular disease due to obesity.   Assessment/Plan:   1. Vitamin D deficiency We will refill prescription Vitamin D for 1 month. Michaela Hanson will follow-up for routine testing of Vitamin D, at least 2-3 times per year to avoid over-replacement.  - ergocalciferol (VITAMIN D2) 1.25 MG (50000 UT) capsule; Take 1 capsule (50,000 Units total) by mouth once a week.  Dispense: 4 capsule; Refill: 0  2. Essential hypertension Michaela Hanson will continue her medications, and will restart her diet and exercise after the holidays.She will watch for signs of hypotension as she continues her lifestyle modifications.  3. At risk for heart disease Michaela Hanson was given approximately 15 minutes of coronary artery disease prevention counseling today. She is 55 y.o. female and has risk factors for heart disease including obesity. We discussed intensive lifestyle modifications today  with an emphasis on specific weight loss instructions and strategies.   Repetitive spaced learning was employed today to elicit superior memory formation and behavioral change.  4. Obesity BMI today is 20 Michaela Hanson is currently in the action stage of change. As such, her goal is to continue with weight loss efforts. She has agreed to the Category 2 Plan.   Exercise goals: As is.  Behavioral modification strategies: increasing lean protein intake, meal planning and cooking strategies, and holiday eating strategies .  Michaela Hanson has agreed to follow-up with our clinic in 3 to 4 weeks. She was informed of the importance of frequent follow-up visits to maximize her success with intensive lifestyle modifications for her multiple health conditions.   Objective:   Blood pressure 119/83, pulse 83, temperature 97.6 F (36.4 C), height 5\' 8"  (1.727 m), weight 233 lb (105.7 kg), SpO2 99 %. Body mass index is 35.43 kg/m.  General: Cooperative, alert, well developed, in no acute distress. HEENT: Conjunctivae and lids unremarkable. Cardiovascular: Regular rhythm.  Lungs: Normal work of breathing. Neurologic: No focal deficits.   Lab Results  Component Value Date   CREATININE 0.95 05/28/2021   BUN 16 05/28/2021   NA 140 05/28/2021   K 4.5 05/28/2021   CL 99 05/28/2021   CO2 25 05/28/2021   Lab Results  Component Value Date   ALT 12 05/28/2021   AST 18 05/28/2021   ALKPHOS 89 05/28/2021   BILITOT 0.6 05/28/2021   Lab Results  Component Value Date   HGBA1C 5.9 (H) 09/26/2021   HGBA1C 6.3 (H) 05/28/2021   HGBA1C 6.2 (  H) 02/26/2021   HGBA1C 5.7 (H) 11/28/2019   Lab Results  Component Value Date   INSULIN 7.5 09/26/2021   INSULIN 8.4 05/28/2021   INSULIN 15.7 02/26/2021   Lab Results  Component Value Date   TSH 3.720 02/26/2021   Lab Results  Component Value Date   CHOL 176 05/28/2021   HDL 55 05/28/2021   LDLCALC 113 (H) 05/28/2021   TRIG 36 05/28/2021   CHOLHDL 2.9  11/28/2019   Lab Results  Component Value Date   VD25OH 40.7 09/26/2021   VD25OH 50.0 05/28/2021   VD25OH 32.6 02/26/2021   Lab Results  Component Value Date   WBC 3.4 09/26/2021   HGB 12.4 09/26/2021   HCT 36.3 09/26/2021   MCV 89 09/26/2021   PLT 203 09/26/2021   Lab Results  Component Value Date   IRON 60 05/28/2021   TIBC 358 05/28/2021   FERRITIN 64 05/28/2021   Attestation Statements:   Reviewed by clinician on day of visit: allergies, medications, problem list, medical history, surgical history, family history, social history, and previous encounter notes.   I, Burt Knack, am acting as transcriptionist for Quillian Quince, MD.  I have reviewed the above documentation for accuracy and completeness, and I agree with the above. -  Quillian Quince, MD

## 2021-11-18 ENCOUNTER — Other Ambulatory Visit: Payer: Self-pay

## 2021-11-18 DIAGNOSIS — M4807 Spinal stenosis, lumbosacral region: Secondary | ICD-10-CM

## 2021-11-21 ENCOUNTER — Other Ambulatory Visit: Payer: Self-pay

## 2021-11-21 ENCOUNTER — Encounter (INDEPENDENT_AMBULATORY_CARE_PROVIDER_SITE_OTHER): Payer: Self-pay | Admitting: Physician Assistant

## 2021-11-21 ENCOUNTER — Ambulatory Visit (INDEPENDENT_AMBULATORY_CARE_PROVIDER_SITE_OTHER): Payer: BC Managed Care – PPO | Admitting: Physician Assistant

## 2021-11-21 VITALS — BP 127/82 | HR 53 | Temp 97.9°F | Ht 68.0 in | Wt 231.0 lb

## 2021-11-21 DIAGNOSIS — Z6835 Body mass index (BMI) 35.0-35.9, adult: Secondary | ICD-10-CM | POA: Diagnosis not present

## 2021-11-21 DIAGNOSIS — E559 Vitamin D deficiency, unspecified: Secondary | ICD-10-CM | POA: Diagnosis not present

## 2021-11-21 DIAGNOSIS — E669 Obesity, unspecified: Secondary | ICD-10-CM | POA: Diagnosis not present

## 2021-11-21 DIAGNOSIS — R7303 Prediabetes: Secondary | ICD-10-CM | POA: Diagnosis not present

## 2021-11-21 DIAGNOSIS — Z6839 Body mass index (BMI) 39.0-39.9, adult: Secondary | ICD-10-CM

## 2021-11-21 DIAGNOSIS — Z9189 Other specified personal risk factors, not elsewhere classified: Secondary | ICD-10-CM

## 2021-11-21 MED ORDER — ERGOCALCIFEROL 1.25 MG (50000 UT) PO CAPS: 50000.0000 [IU] | ORAL_CAPSULE | ORAL | 0 refills | Status: DC

## 2021-11-21 NOTE — Progress Notes (Signed)
Chief Complaint:   OBESITY Michaela Hanson is here to discuss her progress with her obesity treatment plan along with follow-up of her obesity related diagnoses. Michaela Hanson is on the Category 2 Plan and states she is following her eating plan approximately 80% of the time. Michaela Hanson states she is riding the stationary bike for 15-20 minutes 3 times per week.  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 She is tired of breakfast and would like another option.  Subjective:   1. Vitamin D deficiency Michaela Hanson is on Vitamin D weekly.  2. Pre-diabetes Michaela Hanson's last A1c was 5.9, and she is not on medications. I discussed labs with the patient today.  3. At risk for diabetes mellitus Michaela Hanson is at higher than average risk for developing diabetes due to obesity.   Assessment/Plan:   1. Vitamin D deficiency Low Vitamin D level contributes to fatigue and are associated with obesity, breast, and colon cancer. We will refill prescription Vitamin D for 1 month. Michaela Hanson will follow-up for routine testing of Vitamin D, at least 2-3 times per year to avoid over-replacement.  - ergocalciferol (VITAMIN D2) 1.25 MG (50000 UT) capsule; Take 1 capsule (50,000 Units total) by mouth once a week.  Dispense: 4 capsule; Refill: 0  2. Pre-diabetes We will recheck labs in the beginning of March. Michaela Hanson will continue her meal plan, exercise, and decreasing simple carbohydrates to help decrease the risk of diabetes.   3. At risk for diabetes mellitus Michaela Hanson was given approximately 15 minutes of diabetes education and counseling today. We discussed intensive lifestyle modifications today with an emphasis on weight loss as well as increasing  exercise and decreasing simple carbohydrates in her diet. We also reviewed medication options with an emphasis on risk versus benefit of those discussed.   Repetitive spaced learning was employed today to elicit superior memory formation and behavioral change.  4. Obesity BMI today is 35.13 Michaela Hanson is currently in the action stage of change. As such, her goal is to continue with weight loss efforts. She has agreed to the Category 2 Plan and keeping a food journal and adhering to recommended goals of 400-500 calories and 35 grams of protein at supper daily.   Exercise goals: As is.  Behavioral modification strategies: meal planning and cooking strategies and keeping healthy foods in the home.  Michaela Hanson has agreed to follow-up with our clinic in 3 weeks. She was informed of the importance of frequent follow-up visits to maximize her success with intensive lifestyle modifications for her multiple health conditions.   Objective:   Blood pressure 127/82, pulse (!) 53, temperature 97.9 F (36.6 C), height 5\' 8"  (1.727 m), weight 231 lb (104.8 kg), SpO2 98 %. Body mass index is 35.12 kg/m.  General: Cooperative, alert, well developed, in no acute distress. HEENT: Conjunctivae and lids unremarkable. Cardiovascular: Regular rhythm.  Lungs: Normal work of breathing. Neurologic: No focal deficits.   Lab Results  Component Value Date   CREATININE 0.95 05/28/2021   BUN 16 05/28/2021   NA 140 05/28/2021   K 4.5 05/28/2021   CL 99 05/28/2021   CO2 25 05/28/2021   Lab Results  Component Value Date   ALT 12 05/28/2021   AST 18 05/28/2021  ALKPHOS 89 05/28/2021   BILITOT 0.6 05/28/2021   Lab Results  Component Value Date   HGBA1C 5.9 (H) 09/26/2021   HGBA1C 6.3 (H) 05/28/2021   HGBA1C 6.2 (H) 02/26/2021   HGBA1C 5.7 (H) 11/28/2019   Lab Results  Component Value Date   INSULIN 7.5 09/26/2021   INSULIN 8.4 05/28/2021   INSULIN 15.7 02/26/2021   Lab Results  Component Value Date    TSH 3.720 02/26/2021   Lab Results  Component Value Date   CHOL 176 05/28/2021   HDL 55 05/28/2021   LDLCALC 113 (H) 05/28/2021   TRIG 36 05/28/2021   CHOLHDL 2.9 11/28/2019   Lab Results  Component Value Date   VD25OH 40.7 09/26/2021   VD25OH 50.0 05/28/2021   VD25OH 32.6 02/26/2021   Lab Results  Component Value Date   WBC 3.4 09/26/2021   HGB 12.4 09/26/2021   HCT 36.3 09/26/2021   MCV 89 09/26/2021   PLT 203 09/26/2021   Lab Results  Component Value Date   IRON 60 05/28/2021   TIBC 358 05/28/2021   FERRITIN 64 05/28/2021   Attestation Statements:   Reviewed by clinician on day of visit: allergies, medications, problem list, medical history, surgical history, family history, social history, and previous encounter notes.   Wilhemena Durie, am acting as transcriptionist for [employer], PA-C.  I have reviewed the above documentation for accuracy and completeness, and I agree with the above. Abby Potash, PA-C

## 2021-12-09 ENCOUNTER — Other Ambulatory Visit (HOSPITAL_COMMUNITY)
Admission: RE | Admit: 2021-12-09 | Discharge: 2021-12-09 | Disposition: A | Discharge status: Home / Self Care | Payer: BC Managed Care – PPO | Source: Ambulatory Visit | Attending: Obstetrics & Gynecology | Admitting: Obstetrics & Gynecology

## 2021-12-09 ENCOUNTER — Other Ambulatory Visit: Payer: Self-pay

## 2021-12-09 ENCOUNTER — Encounter (HOSPITAL_BASED_OUTPATIENT_CLINIC_OR_DEPARTMENT_OTHER): Payer: Self-pay | Admitting: Obstetrics & Gynecology

## 2021-12-09 ENCOUNTER — Ambulatory Visit (INDEPENDENT_AMBULATORY_CARE_PROVIDER_SITE_OTHER): Payer: BC Managed Care – PPO | Admitting: Obstetrics & Gynecology

## 2021-12-09 VITALS — BP 131/87 | HR 83 | Ht 67.0 in | Wt 243.0 lb

## 2021-12-09 DIAGNOSIS — N952 Postmenopausal atrophic vaginitis: Secondary | ICD-10-CM | POA: Diagnosis not present

## 2021-12-09 DIAGNOSIS — Z9889 Other specified postprocedural states: Secondary | ICD-10-CM

## 2021-12-09 DIAGNOSIS — Z860100 Personal history of colon polyps, unspecified: Secondary | ICD-10-CM

## 2021-12-09 DIAGNOSIS — I10 Essential (primary) hypertension: Secondary | ICD-10-CM

## 2021-12-09 DIAGNOSIS — R7303 Prediabetes: Secondary | ICD-10-CM

## 2021-12-09 DIAGNOSIS — Z124 Encounter for screening for malignant neoplasm of cervix: Secondary | ICD-10-CM

## 2021-12-09 DIAGNOSIS — Z01419 Encounter for gynecological examination (general) (routine) without abnormal findings: Secondary | ICD-10-CM

## 2021-12-09 MED ORDER — ESTRADIOL 0.1 MG/GM VA CREA: TOPICAL_CREAM | VAGINAL | 3 refills | Status: DC

## 2021-12-09 NOTE — Progress Notes (Signed)
56 y.o. G73P2002 Married [place] American female here for annual exam.  Doing well.  Denies vaginal bleeding.    Grandmother passed in May.  She was 100.    No LMP recorded. Patient has had an ablation.          Sexually active: Yes.    The current method of family planning is post menopausal status.    Exercising: No.   Smoker:  no  Health Maintenance: Pap:  11/28/2019 Negative History of abnormal Pap:  yes, last abnormal was 2016 MMG:  07/12/2021 Negative Colonoscopy:  06/16/2018, f/u 5 years BMD:   not indicated Screening Labs: 11/2020   reports that she has never smoked. She has never used smokeless tobacco. She reports that she does not drink alcohol and does not use drugs.  Past Medical History:  Diagnosis Date   Abnormal Pap smear 8/13   8/13 ASCUS +HPV, 8/14 ASCUS HPV not detected, 06-26-14 ASCUS +HPV HR   Anemia    WITH PREGNANCY   Anxiety    Arthritis    LEFT KNEE   Back pain    Hypertension    Insomnia    Migraine headache    with aura (visual changes)   Other fatigue    SOB (shortness of breath) on exertion    Vitamin B12 deficiency    Vitamin D deficiency     Past Surgical History:  Procedure Laterality Date   ANKLE FRACTURE SURGERY Right 08/2019   APPENDECTOMY  2005   ARTHROSCOPY WITH ANTERIOR CRUCIATE LIGAMENT (ACL) REPAIR WITH ANTERIOR TIBILIAS GRAFT  03/07/2016    Dr. Thamas Jaegers   BREAST CYST EXCISION Left 6/13   COLPOSCOPY  9/13   CIN1, 9/14 LGSIL   DILATATION & CURETTAGE/HYSTEROSCOPY WITH MYOSURE N/A 09/15/2017   Procedure: DILATATION & CURETTAGE/HYSTEROSCOPY,WITH ROLLERBALL ABLATION;  Surgeon: Jerene Bears, MD;  Location: [facility];  Service: Gynecology;  Laterality: N/A;   TUBAL LIGATION  1998   BTL    Current Outpatient Medications  Medication Sig Dispense Refill   clobetasol ointment (TEMOVATE) 0.05 % Apply topically 2 (two) times daily. Do not use for more than 7 days in a row. 30 g 1   ergocalciferol (VITAMIN D2)  1.25 MG (50000 UT) capsule Take 1 capsule (50,000 Units total) by mouth once a week. 4 capsule 0   estradiol (ESTRACE) 0.1 MG/GM vaginal cream 1 gram vaginally daily x 2 weeks, then use twice weekly 42.5 g 1   ferrous sulfate 325 (65 FE) MG tablet Take by mouth.     hydrochlorothiazide (HYDRODIURIL) 25 MG tablet Take 25 mg by mouth daily. Takes 1/2 12.5mg      levocetirizine (XYZAL) 5 MG tablet Take by mouth.     Multiple Vitamins-Minerals (MULTIVITAMIN PO) Take by mouth daily.     No current facility-administered medications for this visit.    Family History  Problem Relation Age of Onset   Hypertension Mother    Hypertension Father    Hyperlipidemia Father    Cancer Father    Thyroid disease Father    Breast cancer Sister 9   Breast cancer Paternal Aunt 76       died of pancreatic cancer age 42    Review of Systems  All other systems reviewed and are negative.  Exam:   BP 131/87 (BP Location: Right Arm, Patient Position: Sitting, Cuff Size: Large)    Pulse 83    Ht 5\' 7"  (1.702 m)    Wt 243 lb (  110.2 kg)    BMI 38.06 kg/m   Height: 5\' 7"  (170.2 cm)  General appearance: alert, cooperative and appears stated age Head: Normocephalic, without obvious abnormality, atraumatic Neck: no adenopathy, supple, symmetrical, trachea midline and thyroid normal to inspection and palpation Lungs: clear to auscultation bilaterally Breasts: normal appearance, no masses or tenderness Heart: regular rate and rhythm Abdomen: soft, non-tender; bowel sounds normal; no masses,  no organomegaly Extremities: extremities normal, atraumatic, no cyanosis or edema Skin: Skin color, texture, turgor normal. No rashes or lesions Lymph nodes: Cervical, supraclavicular, and axillary nodes normal. No abnormal inguinal nodes palpated Neurologic: Grossly normal   Pelvic: External genitalia:  no lesions              Urethra:  normal appearing urethra with no masses, tenderness or lesions               Bartholins and Skenes: normal                 Vagina: normal appearing vagina with normal color and no discharge, no lesions              Cervix: no lesions              Pap taken: Yes.   Bimanual Exam:  Uterus:  normal size, contour, position, consistency, mobility, non-tender              Adnexa: normal adnexa and no mass, fullness, tenderness               Rectovaginal: Confirms               Anus:  normal sphincter tone, no lesions  Chaperone, , CMA, was present for exam.  Assessment/Plan: 1. Well woman exam with routine gynecological exam - pap and HR HPV obtained today - MMG 07/2021 - Colonoscopy 2019 - plan BMD around age 40 - lab work done earlier this month - vaccines reviewed/updated  2. Cervical cancer screening - Cytology - PAP( [facility])  3. Atrophic vaginitis - estradiol (ESTRACE) 0.1 MG/GM vaginal cream; 1 gram vaginally twice weekly  Dispense: 42.5 g; Refill: 3  4. H/O LEEP  5. History of colon polyps  6. Prediabetes  7. Essential hypertension

## 2021-12-10 LAB — CYTOLOGY - PAP
Comment: NEGATIVE
Diagnosis: NEGATIVE
High risk HPV: NEGATIVE

## 2021-12-12 ENCOUNTER — Ambulatory Visit (INDEPENDENT_AMBULATORY_CARE_PROVIDER_SITE_OTHER): Payer: BC Managed Care – PPO | Admitting: Family Medicine

## 2021-12-26 ENCOUNTER — Ambulatory Visit (INDEPENDENT_AMBULATORY_CARE_PROVIDER_SITE_OTHER): Payer: BC Managed Care – PPO | Admitting: Physician Assistant

## 2021-12-26 ENCOUNTER — Other Ambulatory Visit: Payer: Self-pay

## 2021-12-26 ENCOUNTER — Encounter: Payer: Self-pay | Admitting: Physician Assistant

## 2021-12-26 DIAGNOSIS — M7632 Iliotibial band syndrome, left leg: Secondary | ICD-10-CM

## 2021-12-26 NOTE — Progress Notes (Signed)
Office Visit Note   Patient: Michaela Hanson           Date of Birth: Aug 20, 1966           MRN: [id number] Visit Date: 12/26/2021              Requested by: Cathlean Sauer, [id number] [place],  [place] [id number] PCP: Cathlean Sauer, MD   Assessment & Plan: Visit Diagnoses:  1. It band syndrome, left     Plan: She shown IT band stretching exercises.  She will continue with Voltaren gel up to 4 g 4 times daily over the lateral knee at the distal IT band region.  She may benefit from an injection in this area if it continues to bother her.  Regards to her ankle which she broke some years ago and had open reduction internal fixation performed at [facility] recommend compression hose whenever she goes to ambulate to help with swelling and hopefully help with the pain she is having no mechanical symptoms of the ankle.  Follow-Up Instructions: Return if symptoms worsen or fail to improve.   Orders:  No orders of the defined types were placed in this encounter.  No orders of the defined types were placed in this encounter.     Procedures: No procedures performed   Clinical Data: No additional findings.   Subjective: Chief Complaint  Patient presents with   Left Knee - Pain, Follow-up    HPI Michaela Hanson returns today due to knee pain.  She has known osteoarthritis in the knee involving the patellofemoral compartment and medial compartment.  She has developed some lateral pain.  She states she almost canceled the appointment today due to the fact that her pain is dissipated greatly with the use of Voltaren gel.  She is having no mechanical symptoms of the knee.  She is working on weight loss and she is riding bike approximately 15 to 20 minutes 3 times a week.  She is prediabetic. Review of Systems See HPI  Objective: Vital Signs: There were no vitals taken for this visit.  Physical Exam Constitutional:      Appearance: She is not ill-appearing or  diaphoretic.  Pulmonary:     Effort: Pulmonary effort is normal.  Neurological:     Mental Status: She is alert.  Psychiatric:        Mood and Affect: Mood normal.    Ortho Exam Left knee excellent range of motion without pain.  Nontender medial joint line.  No instability valgus varus stressing.  Slight hyperextension of the knee.  Tenderness over the distal iliotibial band.  No tenderness over the proximal IT band. Specialty Comments:  No specialty comments available.  Imaging: No results found.   PMFS History: Patient Active Problem List   Diagnosis Date Noted   Prediabetes 03/14/2021   Vitamin B12 deficiency 03/14/2021   Vitamin D deficiency 03/14/2021   Absolute anemia 03/14/2021   Other hyperlipidemia 03/14/2021   1st degree AV block 12/06/2019   History of colon polyps 07/27/2018   Unilateral primary osteoarthritis, left knee 02/24/2018   Class 2 severe obesity with serious comorbidity and body mass index (BMI) of 39.0 to 39.9 in adult [facility]) 01/19/2018   Essential hypertension 07/22/2017   Melasma 04/23/2016   Left ACL tear 03/07/2016   Dysplasia of cervix, low grade (CIN 1) 09/21/2015   H/O LEEP 09/21/2015   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   Generalized anxiety disorder 05/07/2015   Back  injury 11/23/2013    Class: Acute   Past Medical History:  Diagnosis Date   Abnormal Pap smear 8/13   8/13 ASCUS +HPV, 8/14 ASCUS HPV not detected, 06-26-14 ASCUS +HPV HR   Anemia    WITH PREGNANCY   Anxiety    Arthritis    LEFT KNEE   Back pain    Hypertension    Insomnia    Migraine headache    with aura (visual changes)   Other fatigue    SOB (shortness of breath) on exertion    Vitamin B12 deficiency    Vitamin D deficiency     Family History  Problem Relation Age of Onset   Hypertension Mother    Hypertension Father    Hyperlipidemia Father    Cancer Father    Thyroid disease Father    Breast cancer Sister 8   Breast cancer Paternal Aunt  27       died of pancreatic cancer age 19    Past Surgical History:  Procedure Laterality Date   ANKLE FRACTURE SURGERY Right 08/2019   APPENDECTOMY  2005   ARTHROSCOPY WITH ANTERIOR CRUCIATE LIGAMENT (ACL) REPAIR WITH ANTERIOR TIBILIAS GRAFT  03/07/2016    Dr. Len Childs   BREAST CYST EXCISION Left 6/13   COLPOSCOPY  9/13   CIN1, 9/14 LGSIL   DILATATION & CURETTAGE/HYSTEROSCOPY WITH MYOSURE N/A 09/15/2017   Procedure: DILATATION & CURETTAGE/HYSTEROSCOPY,WITH ROLLERBALL ABLATION;  Surgeon: Megan Salon, MD;  Location: [facility];  Service: Gynecology;  Laterality: N/A;   TUBAL LIGATION  1998   BTL   Social History   Occupational History   Occupation: RETIRED TEACHER   Occupation: OFFICE SUPPORT    Comment: Part time/20 hr  Tobacco Use   Smoking status: Never   Smokeless tobacco: Never  Vaping Use   Vaping Use: Never used  Substance and Sexual Activity   Alcohol use: No   Drug use: No   Sexual activity: Yes    Partners: Male    Birth control/protection: Surgical    Comment: BTL

## 2022-01-01 ENCOUNTER — Ambulatory Visit (INDEPENDENT_AMBULATORY_CARE_PROVIDER_SITE_OTHER): Payer: BC Managed Care – PPO | Admitting: Physician Assistant

## 2022-01-30 ENCOUNTER — Other Ambulatory Visit (INDEPENDENT_AMBULATORY_CARE_PROVIDER_SITE_OTHER): Payer: Self-pay | Admitting: Physician Assistant

## 2022-01-30 ENCOUNTER — Encounter (INDEPENDENT_AMBULATORY_CARE_PROVIDER_SITE_OTHER): Payer: Self-pay

## 2022-01-30 DIAGNOSIS — E559 Vitamin D deficiency, unspecified: Secondary | ICD-10-CM

## 2022-02-06 ENCOUNTER — Ambulatory Visit (INDEPENDENT_AMBULATORY_CARE_PROVIDER_SITE_OTHER): Payer: BC Managed Care – PPO | Admitting: Physician Assistant

## 2022-02-20 ENCOUNTER — Encounter (INDEPENDENT_AMBULATORY_CARE_PROVIDER_SITE_OTHER): Payer: Self-pay | Admitting: Physician Assistant

## 2022-02-20 ENCOUNTER — Other Ambulatory Visit (INDEPENDENT_AMBULATORY_CARE_PROVIDER_SITE_OTHER): Payer: Self-pay | Admitting: Physician Assistant

## 2022-02-20 ENCOUNTER — Ambulatory Visit (INDEPENDENT_AMBULATORY_CARE_PROVIDER_SITE_OTHER): Payer: BC Managed Care – PPO | Admitting: Physician Assistant

## 2022-02-20 VITALS — BP 119/83 | HR 74 | Temp 97.7°F | Resp 18

## 2022-02-20 DIAGNOSIS — Z9189 Other specified personal risk factors, not elsewhere classified: Secondary | ICD-10-CM

## 2022-02-20 DIAGNOSIS — E559 Vitamin D deficiency, unspecified: Secondary | ICD-10-CM

## 2022-02-20 DIAGNOSIS — E669 Obesity, unspecified: Secondary | ICD-10-CM | POA: Diagnosis not present

## 2022-02-20 DIAGNOSIS — Z6835 Body mass index (BMI) 35.0-35.9, adult: Secondary | ICD-10-CM | POA: Diagnosis not present

## 2022-02-20 DIAGNOSIS — E66812 Obesity, class 2: Secondary | ICD-10-CM

## 2022-02-20 MED ORDER — ERGOCALCIFEROL 1.25 MG (50000 UT) PO CAPS: 50000.0000 [IU] | ORAL_CAPSULE | ORAL | 0 refills | Status: DC

## 2022-02-26 ENCOUNTER — Ambulatory Visit (INDEPENDENT_AMBULATORY_CARE_PROVIDER_SITE_OTHER): Payer: BC Managed Care – PPO | Admitting: Orthopaedic Surgery

## 2022-02-26 VITALS — Ht 67.0 in | Wt 243.0 lb

## 2022-02-26 DIAGNOSIS — G8929 Other chronic pain: Secondary | ICD-10-CM | POA: Diagnosis not present

## 2022-02-26 DIAGNOSIS — M25562 Pain in left knee: Secondary | ICD-10-CM | POA: Diagnosis not present

## 2022-02-26 MED ORDER — LIDOCAINE HCL 1 % IJ SOLN
3.0000 mL | INTRAMUSCULAR | Status: AC | PRN
  Administered 2022-02-26: 3 mL

## 2022-02-26 MED ORDER — METHYLPREDNISOLONE ACETATE 40 MG/ML IJ SUSP
40.0000 mg | INTRAMUSCULAR | Status: AC | PRN
  Administered 2022-02-26: 40 mg via INTRA_ARTICULAR

## 2022-02-26 NOTE — Progress Notes (Signed)
? ?Office Visit Note ?  ?Patient: Michaela Hanson           ?Date of Birth: 08/03/1966           ?MRN: [id number] ?Visit Date: 02/26/2022 ?             ?Requested by: Cathlean Sauer, MD ?[address] ?[address] ?[place],  [place] [address] ?PCP: Cathlean Sauer, MD ? ? ?Assessment & Plan: ?Visit Diagnoses:  ?1. Chronic pain of left knee   ? ? ?Plan:   At this point I have recommended a MRI for her left knee to assess the meniscus and the cartilage.  Given her remote history of arthroscopic surgery that knee combined with multiple injections and continued pain with locking and catching, MRI is warranted.  She is worked on weight loss and activity modification as well as quad strengthening exercises.  She is attempted to offload that knee.  She is rest the knee and taking anti-inflammatories and she still having the mechanical symptoms.  I did place a steroid injection in her left knee today to help decrease the pain.  We will see her back of the MRI of her left knee. ? ?Follow-Up Instructions: Follow-up after MRI ? ?Orders:  ?Orders Placed This Encounter  ?Procedures  ? Large Joint Inj  ? ?No orders of the defined types were placed in this encounter. ? ? ? ? Procedures: ?Large Joint Inj: L knee on 02/26/2022 2:13 PM ?Indications: diagnostic evaluation and pain ?Details: 22 G 1.5 in needle, superolateral approach ? ?Arthrogram: No ? ?Medications: 3 mL lidocaine 1 %; 40 mg methylPREDNISolone acetate 40 MG/ML ?Outcome: tolerated well, no immediate complications ?Procedure, treatment alternatives, risks and benefits explained, specific risks discussed. Consent was given by the patient. Immediately prior to procedure a time out was called to verify the correct patient, procedure, equipment, support staff and site/side marked as required. Patient was prepped and draped in the usual sterile fashion.  ? ? ? ? ?Clinical Data: ?No additional findings. ? ? ?Subjective: ?Chief Complaint  ?Patient presents with  ? Left Knee - Pain  ?The  patient comes in today with continued left knee pain with locking and catching.  She had arthroscopic surgery on that left knee in 2017 and since then has had numerous x-rays showing a well-maintained joint space.  She is having worsening left knee pain with swelling and locking catching.  She is work on activity modification and weight loss.  She has tried anti-inflammatories and multiple injections in the past.  At this point it is detrimentally affecting her mobility her quality of life and her activities of daily living ? ?HPI ? ?Review of Systems ?There is no fever, chills, nausea, vomiting ? ?Objective: ?Vital Signs: Ht 5\' 7"  (1.702 m)   Wt 243 lb (110.2 kg)   BMI 38.06 kg/m?  ? ?Physical Exam ?She is alert and orient x3 and in no acute distress ?Ortho Exam ?Examination of her left knee shows a positive McMurray's exam and sign to the medial compartment of her knee.  There is a moderate effusion.  There is pain throughout the arc of motion of her left knee.  Her Lachman's exam is negative. ?Specialty Comments:  ?No specialty comments available. ? ?Imaging: ?No results found. ? ? ?PMFS History: ?Patient Active Problem List  ? Diagnosis Date Noted  ? Prediabetes 03/14/2021  ? Vitamin B12 deficiency 03/14/2021  ? Vitamin D deficiency 03/14/2021  ? Absolute anemia 03/14/2021  ? Other hyperlipidemia 03/14/2021  ?  1st degree AV block 12/06/2019  ? History of colon polyps 07/27/2018  ? Unilateral primary osteoarthritis, left knee 02/24/2018  ? Class 2 severe obesity with serious comorbidity and body mass index (BMI) of 39.0 to 39.9 in adult [facility]) 01/19/2018  ? Essential hypertension 07/22/2017  ? Melasma 04/23/2016  ? Left ACL tear 03/07/2016  ? Dysplasia of cervix, low grade (CIN 1) 09/21/2015  ? H/O LEEP 09/21/2015  ? 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 Generalized anxiety disorder 05/07/2015  ? Back injury 11/23/2013  ?  Class: Acute  ? ?Past Medical History:  ?Diagnosis Date  ? Abnormal Pap  smear 8/13  ? 8/13 ASCUS +HPV, 8/14 ASCUS HPV not detected, 06-26-14 ASCUS +HPV HR  ? Anemia   ? WITH PREGNANCY  ? Anxiety   ? Arthritis   ? LEFT KNEE  ? Back pain   ? Hypertension   ? Insomnia   ? Migraine headache   ? with aura (visual changes)  ? Other fatigue   ? SOB (shortness of breath) on exertion   ? Vitamin B12 deficiency   ? Vitamin D deficiency   ?  ?Family History  ?Problem Relation Age of Onset  ? Hypertension Mother   ? Hypertension Father   ? Hyperlipidemia Father   ? Cancer Father   ? Thyroid disease Father   ? Breast cancer Sister 80  ? Breast cancer Paternal Aunt 21  ?     died of pancreatic cancer age 64  ?  ?Past Surgical History:  ?Procedure Laterality Date  ? ANKLE FRACTURE SURGERY Right 08/2019  ? APPENDECTOMY  2005  ? ARTHROSCOPY WITH ANTERIOR CRUCIATE LIGAMENT (ACL) REPAIR WITH ANTERIOR TIBILIAS GRAFT  03/07/2016   ? Dr. Len Childs  ? BREAST CYST EXCISION Left 6/13  ? COLPOSCOPY  9/13  ? CIN1, 9/14 LGSIL  ? DILATATION & CURETTAGE/HYSTEROSCOPY WITH MYOSURE N/A 09/15/2017  ? Procedure: DILATATION & CURETTAGE/HYSTEROSCOPY,WITH ROLLERBALL ABLATION;  Surgeon: Megan Salon, MD;  Location: [facility];  Service: Gynecology;  Laterality: N/A;  ? TUBAL LIGATION  1998  ? BTL  ? ?Social History  ? ?Occupational History  ? Occupation: RETIRED TEACHER  ? Occupation: OFFICE SUPPORT  ?  Comment: Part time/20 hr  ?Tobacco Use  ? Smoking status: Never  ? Smokeless tobacco: Never  ?Vaping Use  ? Vaping Use: Never used  ?Substance and Sexual Activity  ? Alcohol use: No  ? Drug use: No  ? Sexual activity: Yes  ?  Partners: Male  ?  Birth control/protection: Surgical  ?  Comment: BTL  ? ? ? ? ? ? ?

## 2022-02-27 ENCOUNTER — Other Ambulatory Visit: Payer: Self-pay

## 2022-02-27 DIAGNOSIS — G8929 Other chronic pain: Secondary | ICD-10-CM

## 2022-03-01 ENCOUNTER — Ambulatory Visit (HOSPITAL_BASED_OUTPATIENT_CLINIC_OR_DEPARTMENT_OTHER)
Admission: RE | Admit: 2022-03-01 | Discharge: 2022-03-01 | Disposition: A | Discharge status: Home / Self Care | Payer: BC Managed Care – PPO | Source: Ambulatory Visit | Attending: Orthopaedic Surgery | Admitting: Orthopaedic Surgery

## 2022-03-01 DIAGNOSIS — G8929 Other chronic pain: Secondary | ICD-10-CM | POA: Diagnosis present

## 2022-03-01 DIAGNOSIS — M25562 Pain in left knee: Secondary | ICD-10-CM | POA: Insufficient documentation

## 2022-03-04 NOTE — Progress Notes (Signed)
? ? ? ?Chief Complaint:  ? ?OBESITY ?Michaela Hanson is here to discuss her progress with her obesity treatment plan along with follow-up of her obesity related diagnoses. Michaela Hanson is on the Category 2 Plan and states she is following her eating plan approximately 0% of the time. Michaela Hanson states she is 0 0 minutes 0 times per week. ? ?Today's visit was #: 14 ?Starting weight: 258 lbs ?Starting date: 02/26/2021 ?Today's weight: 247 lbs ?Today's date: 02/20/2022 ?Total lbs lost to date: 11 lbs ?Total lbs lost since last in-office visit: 0 ? ?Interim History: Kyannah hasn't had an office visit since 11/21/2021. She has been dealing with back pain and has had epidural injections. She is also dealing with an ankle injury. She reports feeling depressed due to this issue. She is currently drinking sweet tea.  ? ?Subjective:  ? ?1. Vitamin D deficiency ?Zyniya has been out of Vitamin D for last 4 weeks.  ? ?2. At risk for osteoporosis ?Samala is at higher risk of osteopenia and osteoporosis due to Vitamin D deficiency.   ? ?Assessment/Plan:  ? ?1. Vitamin D deficiency ?Low Vitamin D level contributes to fatigue and are associated with obesity, breast, and colon cancer. We will refill prescription Vitamin D 50,000 IU every week for 1 month with no refills and Gloriajean will follow-up for routine testing of Vitamin D, at least 2-3 times per year to avoid over-replacement. ? ?- ergocalciferol (VITAMIN D2) 1.25 MG (50000 UT) capsule; Take 1 capsule (50,000 Units total) by mouth once a week.  Dispense: 4 capsule; Refill: 0 ? ?2. At risk for osteoporosis ?Lake was given approximately 15 minutes of osteoporosis prevention counseling today. Gurtie is at risk for osteopenia and osteoporosis due to her Vitamin D deficiency. She was encouraged to take her Vit D and follow her higher calcium diet and increase strengthening exercise to help strengthen her bones and decrease her risk of osteopenia and osteoporosis.  ? ?3. Obesity BMI  today is 35.13 ?Abira is currently in the action stage of change. As such, her goal is to continue with weight loss efforts. She has agreed to the Category 2 Plan.  ? ?We will check IC and labs at next visit. ? ?Exercise goals: No exercise has been prescribed at this time. ? ?Behavioral modification strategies: meal planning and cooking strategies and keeping healthy foods in the home. ? ?Nashae has agreed to follow-up with our clinic in 2 weeks. She was informed of the importance of frequent follow-up visits to maximize her success with intensive lifestyle modifications for her multiple health conditions.  ? ?Objective:  ? ?Blood pressure 119/83, pulse 74, temperature 97.7 ?F (36.5 ?C), resp. rate 18, SpO2 98 %. ?There is no height or weight on file to calculate BMI. ? ?General: Cooperative, alert, well developed, in no acute distress. ?HEENT: Conjunctivae and lids unremarkable. ?Cardiovascular: Regular rhythm.  ?Lungs: Normal work of breathing. ?Neurologic: No focal deficits.  ? ?Lab Results  ?Component Value Date  ? CREATININE 0.95 05/28/2021  ? BUN 16 05/28/2021  ? NA 140 05/28/2021  ? K 4.5 05/28/2021  ? CL 99 05/28/2021  ? CO2 25 05/28/2021  ? ?Lab Results  ?Component Value Date  ? ALT 12 05/28/2021  ? AST 18 05/28/2021  ? ALKPHOS 89 05/28/2021  ? BILITOT 0.6 05/28/2021  ? ?Lab Results  ?Component Value Date  ? HGBA1C 5.9 (H) 09/26/2021  ? HGBA1C 6.3 (H) 05/28/2021  ? HGBA1C 6.2 (H) 02/26/2021  ? HGBA1C 5.7 (H) 11/28/2019  ? ?  Lab Results  ?Component Value Date  ? INSULIN 7.5 09/26/2021  ? INSULIN 8.4 05/28/2021  ? INSULIN 15.7 02/26/2021  ? ?Lab Results  ?Component Value Date  ? TSH 3.720 02/26/2021  ? ?Lab Results  ?Component Value Date  ? CHOL 176 05/28/2021  ? HDL 55 05/28/2021  ? LDLCALC 113 (H) 05/28/2021  ? TRIG 36 05/28/2021  ? CHOLHDL 2.9 11/28/2019  ? ?Lab Results  ?Component Value Date  ? VD25OH 40.7 09/26/2021  ? VD25OH 50.0 05/28/2021  ? VD25OH 32.6 02/26/2021  ? ?Lab Results  ?Component Value  Date  ? WBC 3.4 09/26/2021  ? HGB 12.4 09/26/2021  ? HCT 36.3 09/26/2021  ? MCV 89 09/26/2021  ? PLT 203 09/26/2021  ? ?Lab Results  ?Component Value Date  ? IRON 60 05/28/2021  ? TIBC 358 05/28/2021  ? FERRITIN 64 05/28/2021  ? ?Attestation Statements:  ? ?Reviewed by clinician on day of visit: allergies, medications, problem list, medical history, surgical history, family history, social history, and previous encounter notes. ? ?I, Tonye Pearson, am acting as Location manager for [employer], PA-C. ? ?I have reviewed the above documentation for accuracy and completeness, and I agree with the above. Abby Potash, PA-C ? ?

## 2022-03-05 ENCOUNTER — Ambulatory Visit: Payer: BC Managed Care – PPO | Admitting: Orthopaedic Surgery

## 2022-03-05 NOTE — Progress Notes (Signed)
?TeleHealth Visit:  ?This visit was completed with telemedicine (audio/video) technology. ?Michaela Hanson has verbally consented to this TeleHealth visit. The patient is located at home, the provider is located at home. The participants in this visit include the listed provider and patient. The visit was conducted today via MyChart video. ? ?OBESITY ?Michaela Hanson is here to discuss her progress with her obesity treatment plan along with follow-up of her obesity related diagnoses.  ? ?Today's visit was # 15 ?Starting weight: 258 lbs ?Starting date: 02/26/2021 ?Weight at last in office visit: 247 lbs on 02/20/22 ?Total weight loss: 11 lbs at last in office visit on 02/19/22. ?Today's reported weight: 242 lbs  ? ?Nutrition Plan: the Category 2 Plan  at 95% ?Hunger is mostly well controlled. Gets hungry at 4-5 pm.Cravings are well controlled.  ?Current exercise: none ? ?Interim History: Michaela Hanson did not come in for a visit from January 12 until April 13.  She feels she has gotten back on track very well.  According to her reported weight she has lost about 5 pounds since her last office visit. ?He reports she is getting in all of the protein most days and her water intake is good.  She is not skipping meals.  She is a bit bored with breakfast options. ?She has been having issues with her knee and has been unable to exercise. ? ?Assessment/Plan:  ?1. Prediabetes ?Michaela Hanson has a diagnosis of prediabetes based on her elevated HgA1c. ?She denies polyphagia. ?Medication(s): None.  We have discussed starting metformin a few times and so far she has declined.  However she reports she will consider if A1c has worsened with her next lab results. ?Lab Results  ?Component Value Date  ? HGBA1C 5.9 (H) 09/26/2021  ? ?Lab Results  ?Component Value Date  ? INSULIN 7.5 09/26/2021  ? INSULIN 8.4 05/28/2021  ? INSULIN 15.7 02/26/2021  ? ? ?Plan: ?Check labs next office visit. ? ?2. Vitamin D Deficiency ?Vitamin D is not at goal of 50. She  is on weekly prescription Vitamin D 50,000 IU.  ?Lab Results  ?Component Value Date  ? VD25OH 40.7 09/26/2021  ? VD25OH 50.0 05/28/2021  ? VD25OH 32.6 02/26/2021  ? ? ?Plan: ?Continue prescription vitamin D 50,000 IU weekly. ? ?3. Obesity: Current BMI 38.66 ?Michaela Hanson is currently in the action stage of change. As such, her goal is to continue with weight loss efforts.  ?She has agreed to the Category 2 Plan. With breakfast options. ? ?Exercise goals:  She plans on starting chair exercises.  I encouraged her to do these 3 days/week. ? ?Behavioral modification strategies: increasing lean protein intake and planning for success. ? ?Michaela Hanson has agreed to follow-up with our clinic in 3 weeks.  ? ?No orders of the defined types were placed in this encounter. ? ? ?Medications Discontinued During This Encounter  ?Medication Reason  ? ergocalciferol (VITAMIN D2) 1.25 MG (50000 UT) capsule Reorder  ?  ? ?Meds ordered this encounter  ?Medications  ? ergocalciferol (VITAMIN D2) 1.25 MG (50000 UT) capsule  ?  Sig: Take 1 capsule (50,000 Units total) by mouth once a week.  ?  Dispense:  4 capsule  ?  Refill:  0  ?  Order Specific Question:   Supervising Provider  ?  Answer:   Dennard Nip D [id number]  ?   ? ?Objective:  ? ?VITALS: Per patient if applicable, see vitals. ?GENERAL: Alert and in no acute distress. ?CARDIOPULMONARY: No increased WOB. Speaking in clear sentences.  ?PSYCH:  Pleasant and cooperative. Speech normal rate and rhythm. Affect is appropriate. Insight and judgement are appropriate. Attention is focused, linear, and appropriate.  ?NEURO: Oriented as arrived to appointment on time with no prompting.  ? ?Lab Results  ?Component Value Date  ? CREATININE 0.95 05/28/2021  ? BUN 16 05/28/2021  ? NA 140 05/28/2021  ? K 4.5 05/28/2021  ? CL 99 05/28/2021  ? CO2 25 05/28/2021  ? ?Lab Results  ?Component Value Date  ? ALT 12 05/28/2021  ? AST 18 05/28/2021  ? ALKPHOS 89 05/28/2021  ? BILITOT 0.6 05/28/2021  ? ?Lab  Results  ?Component Value Date  ? HGBA1C 5.9 (H) 09/26/2021  ? HGBA1C 6.3 (H) 05/28/2021  ? HGBA1C 6.2 (H) 02/26/2021  ? HGBA1C 5.7 (H) 11/28/2019  ? ?Lab Results  ?Component Value Date  ? INSULIN 7.5 09/26/2021  ? INSULIN 8.4 05/28/2021  ? INSULIN 15.7 02/26/2021  ? ?Lab Results  ?Component Value Date  ? TSH 3.720 02/26/2021  ? ?Lab Results  ?Component Value Date  ? CHOL 176 05/28/2021  ? HDL 55 05/28/2021  ? LDLCALC 113 (H) 05/28/2021  ? TRIG 36 05/28/2021  ? CHOLHDL 2.9 11/28/2019  ? ?Lab Results  ?Component Value Date  ? WBC 3.4 09/26/2021  ? HGB 12.4 09/26/2021  ? HCT 36.3 09/26/2021  ? MCV 89 09/26/2021  ? PLT 203 09/26/2021  ? ?Lab Results  ?Component Value Date  ? IRON 60 05/28/2021  ? TIBC 358 05/28/2021  ? FERRITIN 64 05/28/2021  ? ?Lab Results  ?Component Value Date  ? VD25OH 40.7 09/26/2021  ? VD25OH 50.0 05/28/2021  ? VD25OH 32.6 02/26/2021  ? ? ?Attestation Statements:  ? ?Reviewed by clinician on day of visit: allergies, medications, problem list, medical history, surgical history, family history, social history, and previous encounter notes. ? ? ? ?

## 2022-03-06 ENCOUNTER — Encounter (INDEPENDENT_AMBULATORY_CARE_PROVIDER_SITE_OTHER): Payer: Self-pay | Admitting: Family Medicine

## 2022-03-06 ENCOUNTER — Telehealth (INDEPENDENT_AMBULATORY_CARE_PROVIDER_SITE_OTHER): Payer: BC Managed Care – PPO | Admitting: Family Medicine

## 2022-03-06 DIAGNOSIS — E559 Vitamin D deficiency, unspecified: Secondary | ICD-10-CM

## 2022-03-06 DIAGNOSIS — Z6838 Body mass index (BMI) 38.0-38.9, adult: Secondary | ICD-10-CM

## 2022-03-06 DIAGNOSIS — R7303 Prediabetes: Secondary | ICD-10-CM

## 2022-03-06 DIAGNOSIS — E669 Obesity, unspecified: Secondary | ICD-10-CM

## 2022-03-06 MED ORDER — ERGOCALCIFEROL 1.25 MG (50000 UT) PO CAPS: 50000.0000 [IU] | ORAL_CAPSULE | ORAL | 0 refills | Status: DC

## 2022-03-12 ENCOUNTER — Ambulatory Visit (INDEPENDENT_AMBULATORY_CARE_PROVIDER_SITE_OTHER): Payer: BC Managed Care – PPO | Admitting: Orthopaedic Surgery

## 2022-03-12 ENCOUNTER — Encounter: Payer: Self-pay | Admitting: Orthopaedic Surgery

## 2022-03-12 DIAGNOSIS — M25562 Pain in left knee: Secondary | ICD-10-CM

## 2022-03-12 DIAGNOSIS — G8929 Other chronic pain: Secondary | ICD-10-CM | POA: Diagnosis not present

## 2022-03-12 NOTE — Progress Notes (Signed)
The patient is well-known to me.  She is a 56 year old female who had actually went to high school together.  She has been dealing with chronic left knee pain for some time and has had hyaluronic acid in the past as well as steroid injections.  Her most recent steroid injection did help.  We did obtain an MRI of her knee given the fact that she has been having such pain in the knee and she does have a remote history of knee arthroscopy years ago. ? ?The MRI of her knee shows small radial tears of the root of the meniscus medial and lateral but only at the free edge.  These are of known clinical significance.  There is some thinning of the cartilage in all 3 compartments of her knee; fortunately there is no full thickness cartilage loss. ? ?I talked her in length about collection and the exercises and weight loss.  I think this will help her the most.  Hyaluronic acid is helped in the past and she is to consider [place] ordering that for her again but she wants to wait longer to see how the steroid dose for her and I agree.  All question concerns were answered addressed.  She will let us know how to proceed next. ?

## 2022-03-24 ENCOUNTER — Ambulatory Visit (INDEPENDENT_AMBULATORY_CARE_PROVIDER_SITE_OTHER): Payer: BC Managed Care – PPO | Admitting: Physician Assistant

## 2022-03-27 ENCOUNTER — Ambulatory Visit (INDEPENDENT_AMBULATORY_CARE_PROVIDER_SITE_OTHER): Payer: BC Managed Care – PPO | Admitting: Adult Health

## 2022-04-08 ENCOUNTER — Ambulatory Visit (INDEPENDENT_AMBULATORY_CARE_PROVIDER_SITE_OTHER): Payer: BC Managed Care – PPO | Admitting: Physician Assistant

## 2022-04-10 ENCOUNTER — Ambulatory Visit (INDEPENDENT_AMBULATORY_CARE_PROVIDER_SITE_OTHER): Payer: BC Managed Care – PPO | Admitting: Family Medicine

## 2022-04-15 ENCOUNTER — Ambulatory Visit (INDEPENDENT_AMBULATORY_CARE_PROVIDER_SITE_OTHER): Payer: BC Managed Care – PPO | Admitting: Family Medicine

## 2022-04-23 ENCOUNTER — Ambulatory Visit (INDEPENDENT_AMBULATORY_CARE_PROVIDER_SITE_OTHER): Payer: BC Managed Care – PPO | Admitting: Family Medicine

## 2022-05-01 ENCOUNTER — Telehealth (INDEPENDENT_AMBULATORY_CARE_PROVIDER_SITE_OTHER): Payer: BC Managed Care – PPO | Admitting: Family Medicine

## 2022-05-22 ENCOUNTER — Encounter (INDEPENDENT_AMBULATORY_CARE_PROVIDER_SITE_OTHER): Payer: Self-pay | Admitting: Family Medicine

## 2022-05-22 ENCOUNTER — Ambulatory Visit (INDEPENDENT_AMBULATORY_CARE_PROVIDER_SITE_OTHER): Payer: BC Managed Care – PPO | Admitting: Family Medicine

## 2022-05-22 VITALS — BP 103/71 | HR 69 | Temp 97.9°F | Ht 67.0 in | Wt 247.0 lb

## 2022-05-22 DIAGNOSIS — R0602 Shortness of breath: Secondary | ICD-10-CM

## 2022-05-22 DIAGNOSIS — R7303 Prediabetes: Secondary | ICD-10-CM | POA: Diagnosis not present

## 2022-05-22 DIAGNOSIS — Z6838 Body mass index (BMI) 38.0-38.9, adult: Secondary | ICD-10-CM

## 2022-05-22 DIAGNOSIS — E669 Obesity, unspecified: Secondary | ICD-10-CM

## 2022-05-22 DIAGNOSIS — E559 Vitamin D deficiency, unspecified: Secondary | ICD-10-CM | POA: Diagnosis not present

## 2022-05-22 DIAGNOSIS — E538 Deficiency of other specified B group vitamins: Secondary | ICD-10-CM

## 2022-05-22 DIAGNOSIS — E7849 Other hyperlipidemia: Secondary | ICD-10-CM

## 2022-05-23 LAB — CMP14+EGFR
ALT: 13 IU/L (ref 0–32)
AST: 21 IU/L (ref 0–40)
Albumin/Globulin Ratio: 1.2 (ref 1.2–2.2)
Albumin: 4.3 g/dL (ref 3.8–4.9)
Alkaline Phosphatase: 85 IU/L (ref 44–121)
BUN/Creatinine Ratio: 13 (ref 9–23)
BUN: 11 mg/dL (ref 6–24)
Bilirubin Total: 0.5 mg/dL (ref 0.0–1.2)
CO2: 26 mmol/L (ref 20–29)
Calcium: 9.6 mg/dL (ref 8.7–10.2)
Chloride: 102 mmol/L (ref 96–106)
Creatinine, Ser: 0.84 mg/dL (ref 0.57–1.00)
Globulin, Total: 3.7 g/dL (ref 1.5–4.5)
Glucose: 104 mg/dL — ABNORMAL HIGH (ref 70–99)
Potassium: 4.8 mmol/L (ref 3.5–5.2)
Sodium: 142 mmol/L (ref 134–144)
Total Protein: 8 g/dL (ref 6.0–8.5)
eGFR: 83 mL/min/{1.73_m2} (ref >59)

## 2022-05-23 LAB — LIPID PANEL WITH LDL/HDL RATIO
Cholesterol, Total: 181 mg/dL (ref 100–199)
HDL: 61 mg/dL (ref >39)
LDL Chol Calc (NIH): 113 mg/dL — ABNORMAL HIGH (ref 0–99)
LDL/HDL Ratio: 1.9 ratio (ref 0.0–3.2)
Triglycerides: 36 mg/dL (ref 0–149)
VLDL Cholesterol Cal: 7 mg/dL (ref 5–40)

## 2022-05-23 LAB — VITAMIN B12: Vitamin B-12: 422 pg/mL (ref 232–1245)

## 2022-05-23 LAB — INSULIN, RANDOM: INSULIN: 9.7 u[IU]/mL (ref 2.6–24.9)

## 2022-05-23 LAB — HEMOGLOBIN A1C
Est. average glucose Bld gHb Est-mCnc: 128 mg/dL
Hgb A1c MFr Bld: 6.1 % — ABNORMAL HIGH (ref 4.8–5.6)

## 2022-05-23 LAB — VITAMIN D 25 HYDROXY (VIT D DEFICIENCY, FRACTURES): Vit D, 25-Hydroxy: 38.2 ng/mL (ref 30.0–100.0)

## 2022-05-26 NOTE — Progress Notes (Signed)
Chief Complaint:   OBESITY Michaela Hanson is here to discuss her progress with her obesity treatment plan along with follow-up of her obesity related diagnoses. Michaela Hanson is on the Category 2 Plan with breakfast options and states she is following her eating plan approximately 0% of the time. Michaela Hanson states she is doing 0 minutes 0 times per week.  Today's visit was #: 23 Starting weight: 258 lbs Starting date: 02/26/2021 Today's weight: 247 lbs Today's date: 05/22/2022 Total lbs lost to date: 11 Total lbs lost since last in-office visit: 0  Interim History: Michaela Hanson has been off track with her weight loss efforts for the last 2 to 3 months, but she has done well with avoiding weight gain.  She is ready to get back on track and she would like to review eating plan options.  Subjective:   1. Other hyperlipidemia Michaela Hanson's LDL is elevated, and she is not on statin.  She is working on decreasing her cholesterol.  2. Vitamin D deficiency Michaela Hanson is on vitamin D, and she is due for labs.  3. Pre-diabetes Michaela Hanson is working on her diet and exercise, but she notes polyphagia.  4. SOB (shortness of breath) on exertion Michaela Hanson's last VO2 in our MR are very low.  She has been working on exercise and nutrition, and she is due to have labs checked.  5. B12 deficiency Michaela Hanson has a history of vitamin B12 deficiency.  She is working on following a B12 rich diet.  Assessment/Plan:   1. Other hyperlipidemia We will check labs today. [name] will continue to work on diet, exercise and weight loss efforts. Orders and follow up as documented in patient record.   - Lipid Panel With LDL/HDL Ratio  2. Vitamin D deficiency We will check labs today.  [name] will follow-up for routine testing of Vitamin D, at least 2-3 times per year to avoid over-replacement.  - VITAMIN D 25 Hydroxy (Vit-D Deficiency, Fractures)  3. Pre-diabetes We will check labs today. [name] will continue to work on  weight loss, exercise, and decreasing simple carbohydrates to help decrease the risk of diabetes.   - CMP14+EGFR - Insulin, random - Hemoglobin A1c  4. SOB (shortness of breath) on exertion [name]'s repeat IC today shows a VO2 and RMR slightly improved, but still low.  5. B12 deficiency The diagnosis was reviewed with the patient.  We will check labs today, and we will follow-up at Clotilda's next visit. Orders and follow up as documented in patient record.  - Vitamin B12  6. Obesity, Current BMI 38.8 Michaela Hanson is currently in the action stage of change. As such, her goal is to continue with weight loss efforts. She has agreed to the Category 2 Plan with breakfast and lunch options.   Behavioral modification strategies: increasing lean protein intake and increasing water intake.  Kawehi has agreed to follow-up with our clinic in 2 to 3 weeks. She was informed of the importance of frequent follow-up visits to maximize her success with intensive lifestyle modifications for her multiple health conditions.   Michaela Hanson was informed we would discuss her lab results at her next visit unless there is a critical issue that needs to be addressed sooner. Michaela Hanson agreed to keep her next visit at the agreed upon time to discuss these results.  Objective:   Blood pressure 103/71, pulse 69, temperature 97.9 F (36.6 C), height _0  (1.702 m), weight 247 lb (112 kg), SpO2 99 %. Body mass index is 38.69 kg/m.  General:  Cooperative, alert, well developed, in no acute distress. HEENT: Conjunctivae and lids unremarkable. Cardiovascular: Regular rhythm.  Lungs: Normal work of breathing. Neurologic: No focal deficits.   Lab Results  Component Value Date   CREATININE 0.84 05/22/2022   BUN 11 05/22/2022   NA 142 05/22/2022   K 4.8 05/22/2022   CL 102 05/22/2022   CO2 26 05/22/2022   Lab Results  Component Value Date   ALT 13 05/22/2022   AST 21 05/22/2022   ALKPHOS 85 05/22/2022    BILITOT 0.5 05/22/2022   Lab Results  Component Value Date   HGBA1C 6.1 (H) 05/22/2022   HGBA1C 5.9 (H) 09/26/2021   HGBA1C 6.3 (H) 05/28/2021   HGBA1C 6.2 (H) 02/26/2021   HGBA1C 5.7 (H) 11/28/2019   Lab Results  Component Value Date   INSULIN 9.7 05/22/2022   INSULIN 7.5 09/26/2021   INSULIN 8.4 05/28/2021   INSULIN 15.7 02/26/2021   Lab Results  Component Value Date   TSH 3.720 02/26/2021   Lab Results  Component Value Date   CHOL 181 05/22/2022   HDL 61 05/22/2022   LDLCALC 113 (H) 05/22/2022   TRIG 36 05/22/2022   CHOLHDL 2.9 11/28/2019   Lab Results  Component Value Date   VD25OH 38.2 05/22/2022   VD25OH 40.7 09/26/2021   VD25OH 50.0 05/28/2021   Lab Results  Component Value Date   WBC 3.4 09/26/2021   HGB 12.4 09/26/2021   HCT 36.3 09/26/2021   MCV 89 09/26/2021   PLT 203 09/26/2021   Lab Results  Component Value Date   IRON 60 05/28/2021   TIBC 358 05/28/2021   FERRITIN 64 05/28/2021   Attestation Statements:   Reviewed by clinician on day of visit: allergies, medications, problem list, medical history, surgical history, family history, social history, and previous encounter notes.  Time spent on visit including pre-visit chart review and post-visit care and charting was 45 minutes.   I, Trixie Dredge, am acting as transcriptionist for Dennard Nip, MD.  I have reviewed the above documentation for accuracy and completeness, and I agree with the above. -  Dennard Nip, MD

## 2022-05-30 ENCOUNTER — Encounter: Payer: Self-pay | Admitting: Orthopaedic Surgery

## 2022-05-30 ENCOUNTER — Other Ambulatory Visit: Payer: Self-pay | Admitting: Orthopaedic Surgery

## 2022-05-30 MED ORDER — ACETAMINOPHEN-CODEINE 300-30 MG PO TABS: 1.0000 | ORAL_TABLET | Freq: Three times a day (TID) | ORAL | 0 refills | Status: DC | PRN

## 2022-05-30 MED ORDER — DICLOFENAC SODIUM 75 MG PO TBEC: 75.0000 mg | DELAYED_RELEASE_TABLET | Freq: Two times a day (BID) | ORAL | 1 refills | Status: DC | PRN

## 2022-06-09 ENCOUNTER — Ambulatory Visit (INDEPENDENT_AMBULATORY_CARE_PROVIDER_SITE_OTHER): Payer: BC Managed Care – PPO | Admitting: Family Medicine

## 2022-06-10 ENCOUNTER — Encounter (INDEPENDENT_AMBULATORY_CARE_PROVIDER_SITE_OTHER): Payer: Self-pay | Admitting: Family Medicine

## 2022-06-10 ENCOUNTER — Ambulatory Visit (INDEPENDENT_AMBULATORY_CARE_PROVIDER_SITE_OTHER): Payer: BC Managed Care – PPO | Admitting: Family Medicine

## 2022-06-10 VITALS — BP 112/78 | HR 73 | Temp 98.2°F | Ht 67.0 in | Wt 247.0 lb

## 2022-06-10 DIAGNOSIS — F32A Depression, unspecified: Secondary | ICD-10-CM | POA: Insufficient documentation

## 2022-06-10 DIAGNOSIS — F3289 Other specified depressive episodes: Secondary | ICD-10-CM

## 2022-06-10 DIAGNOSIS — E559 Vitamin D deficiency, unspecified: Secondary | ICD-10-CM

## 2022-06-10 DIAGNOSIS — Z6838 Body mass index (BMI) 38.0-38.9, adult: Secondary | ICD-10-CM | POA: Diagnosis not present

## 2022-06-10 DIAGNOSIS — E669 Obesity, unspecified: Secondary | ICD-10-CM | POA: Diagnosis not present

## 2022-06-10 MED ORDER — BUPROPION HCL ER (SR) 150 MG PO TB12: 150.0000 mg | ORAL_TABLET | Freq: Every day | ORAL | 0 refills | Status: DC

## 2022-06-10 MED ORDER — ERGOCALCIFEROL 1.25 MG (50000 UT) PO CAPS: 50000.0000 [IU] | ORAL_CAPSULE | ORAL | 0 refills | Status: DC

## 2022-06-18 ENCOUNTER — Encounter (INDEPENDENT_AMBULATORY_CARE_PROVIDER_SITE_OTHER): Payer: Self-pay

## 2022-06-19 DIAGNOSIS — M48061 Spinal stenosis, lumbar region without neurogenic claudication: Secondary | ICD-10-CM | POA: Insufficient documentation

## 2022-06-22 NOTE — Progress Notes (Unsigned)
Chief Complaint:   OBESITY Michaela Hanson is here to discuss her progress with her obesity treatment plan along with follow-up of her obesity related diagnoses. Michaela Hanson is on the Category 2 Plan with breakfast and lunch options and states she is following her eating plan approximately 85% of the time. Michaela Hanson states she is walking for 20 minutes 2 times per week.  Today's visit was #: 17 Starting weight: 258 lbs Starting date: 02/26/2021 Today's weight: 247 lbs Today's date: 06/10/2022 Total lbs lost to date: 11 Total lbs lost since last in-office visit: 0  Interim History: Michaela Hanson has done well with maintaining her weight.  She is frustrated that she has not lost more weight.  She tried to increase her exercise while on vacation.  Subjective:   1. Vitamin D deficiency Michaela Hanson is stable on vitamin D, with no side effects noted.  2. Other depression, emotional eating binges Michaela Hanson is struggling with increased comfort/stress eating, worse in the p.m.  She is frustrated with her stall and weight loss despite her increase in muscle mass.  Assessment/Plan:   1. Vitamin D deficiency We will refill prescription Vitamin D 50,000 IU every week for 1 month. Michaela Hanson will follow-up for routine testing of Vitamin D, at least 2-3 times per year to avoid over-replacement.  - ergocalciferol (VITAMIN D2) 1.25 MG (50000 UT) capsule; Take 1 capsule (50,000 Units total) by mouth once a week.  Dispense: 4 capsule; Refill: 0  2. Other depression, emotional eating binges Michaela Hanson agreed to start Wellbutrin SR 150 mg q AM with no refills. Behavior modification techniques were discussed today to help Michaela Hanson deal with her emotional/non-hunger eating behaviors.  Orders and follow up as documented in patient record.   - buPROPion (WELLBUTRIN SR) 150 MG 12 hr tablet; Take 1 tablet (150 mg total) by mouth daily.  Dispense: 30 tablet; Refill: 0  3. Obesity, Current BMI 38.7 Michaela Hanson is currently in the  action stage of change. As such, her goal is to continue with weight loss efforts. She has agreed to the Category 2 Plan.   Exercise goals: As is.   Behavioral modification strategies: increasing lean protein intake, increasing water intake, no skipping meals, and emotional eating strategies.  Michaela Hanson has agreed to follow-up with our clinic in 3 to 4 weeks. She was informed of the importance of frequent follow-up visits to maximize her success with intensive lifestyle modifications for her multiple health conditions.   Objective:   Blood pressure 112/78, pulse 73, temperature 98.2 F (36.8 C), height 5\' 7"  (1.702 m), weight 247 lb (112 kg), SpO2 99 %. Body mass index is 38.69 kg/m.  General: Cooperative, alert, well developed, in no acute distress. HEENT: Conjunctivae and lids unremarkable. Cardiovascular: Regular rhythm.  Lungs: Normal work of breathing. Neurologic: No focal deficits.   Lab Results  Component Value Date   CREATININE 0.84 05/22/2022   BUN 11 05/22/2022   NA 142 05/22/2022   K 4.8 05/22/2022   CL 102 05/22/2022   CO2 26 05/22/2022   Lab Results  Component Value Date   ALT 13 05/22/2022   AST 21 05/22/2022   ALKPHOS 85 05/22/2022   BILITOT 0.5 05/22/2022   Lab Results  Component Value Date   HGBA1C 6.1 (H) 05/22/2022   HGBA1C 5.9 (H) 09/26/2021   HGBA1C 6.3 (H) 05/28/2021   HGBA1C 6.2 (H) 02/26/2021   HGBA1C 5.7 (H) 11/28/2019   Lab Results  Component Value Date   INSULIN 9.7 05/22/2022   INSULIN  7.5 09/26/2021   INSULIN 8.4 05/28/2021   INSULIN 15.7 02/26/2021   Lab Results  Component Value Date   TSH 3.720 02/26/2021   Lab Results  Component Value Date   CHOL 181 05/22/2022   HDL 61 05/22/2022   LDLCALC 113 (H) 05/22/2022   TRIG 36 05/22/2022   CHOLHDL 2.9 11/28/2019   Lab Results  Component Value Date   VD25OH 38.2 05/22/2022   VD25OH 40.7 09/26/2021   VD25OH 50.0 05/28/2021   Lab Results  Component Value Date   WBC 3.4  09/26/2021   HGB 12.4 09/26/2021   HCT 36.3 09/26/2021   MCV 89 09/26/2021   PLT 203 09/26/2021   Lab Results  Component Value Date   IRON 60 05/28/2021   TIBC 358 05/28/2021   FERRITIN 64 05/28/2021   Attestation Statements:   Reviewed by clinician on day of visit: allergies, medications, problem list, medical history, surgical history, family history, social history, and previous encounter notes.   I, Burt Knack, am acting as transcriptionist for Quillian Quince, MD.  I have reviewed the above documentation for accuracy and completeness, and I agree with the above. -  Quillian Quince, MD

## 2022-07-01 NOTE — Progress Notes (Unsigned)
TeleHealth Visit:  This visit was completed with telemedicine (audio/video) technology. [name] has verbally consented to this TeleHealth visit. The patient is located at home, the provider is located at home. The participants in this visit include the listed provider and patient. The visit was conducted today via MyChart video.  OBESITY Michaela Hanson is here to discuss her progress with her obesity treatment plan along with follow-up of her obesity related diagnoses.   Today's visit was # 18 Starting weight: 258 lbs Starting date: 02/26/2021 Weight at last in office visit: 247 lbs on 06/10/22 Total weight loss: 11 lbs at last in office visit on 06/10/22. Today's reported weight: 244.8 lbs   Nutrition Plan: the Category 2 Plan- 85% adherence. Hunger is moderately controlled. Cravings are moderately controlled.  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  She says this has gotten her somewhat off of her normal eating schedule and she has not been able to eat all of her lunch a few days.  She expects this to calm down and get back to usual within the next few weeks. She is getting hungry between lunch and dinner.  Assessment/Plan:  We went over her lab results from 05/22/2022.  1. Other depression/emotional eating Merelin has issues with stress/emotional eating. Currently this is moderately controlled. Overall mood is stable.  She was prescribed bupropion at her last office visit but has not started taking it and prefers not to take it.  She was on Lexapro for years and then had a terrible time getting off of the medication.  Plan: Consider referral to Dr. Dewaine Conger at next office visit.  2. Hyperlipidemia LDL is slightly elevated at 113.  HDL and triglycerides are normal.  ASCVD 10-year risk score is  3.3%. Medication(s): none Cardiovascular risk factors: dyslipidemia, hypertension, obesity (BMI >= 30 kg/m2), and sedentary lifestyle  Lab Results  Component Value Date   CHOL 181 05/22/2022   HDL 61 05/22/2022   LDLCALC 113 (H) 05/22/2022   TRIG 36 05/22/2022   CHOLHDL 2.9 11/28/2019   Lab Results  Component Value Date   ALT 13 05/22/2022   AST 21 05/22/2022   ALKPHOS 85 05/22/2022   BILITOT 0.5 05/22/2022   The 10-year ASCVD risk score (Arnett DK, et al., 2019) is: 3.3%   Values used to calculate the score:     Age: 23 years     Sex: Female     Is Non-Hispanic African American: Yes     Diabetic: No     Tobacco smoker: No     Systolic Blood Pressure: 123 mmHg     Is BP treated: Yes     HDL Cholesterol: 61 mg/dL     Total Cholesterol: 181 mg/dL  Plan: No statin indicated per ASCVD risk score. Increase regular exercise. Decrease saturated fat. Increase fiber.   3. Prediabetes [name] has a diagnosis of prediabetes based on her elevated HgA1c. A1c has been in prediabetic range since 2021.  She has no family history of diabetes.  Reports polyphagia in the afternoon. Medication(s): None Lab Results  Component Value Date   HGBA1C 6.1 (H) 05/22/2022   Lab Results  Component Value Date   INSULIN 9.7 05/22/2022   INSULIN 7.5 09/26/2021   INSULIN 8.4 05/28/2021   INSULIN 15.7 02/26/2021    Plan: We discussed the risks and  benefits of metformin at length.  She was a bit reluctant but finally agreed to take 1/2 tablet (250 mg) daily to assist with polyphagia and reduce risk of progression of prediabetes to diabetes.  New prescription for metformin 1/2 tablet, 250 mg daily at lunch. metFORMIN (GLUCOPHAGE) 500 MG tablet   Sig: Take 0.5 tablets (250 mg total) by mouth daily with lunch.   Dispense:  30 tablet   Refill:  0    4. Vitamin D Deficiency Vitamin D is not at goal of 50.  D was below goal (38.2) on 05/22/2022. She is on weekly prescription Vitamin D 50,000  IU.  Lab Results  Component Value Date   VD25OH 38.2 05/22/2022   VD25OH 40.7 09/26/2021   VD25OH 50.0 05/28/2021    Plan: Refill vitamin D 50,000 IU weekly.  ergocalciferol (VITAMIN D2) 1.25 MG (50000 UT) capsule    Sig: Take 1 capsule (50,000 Units total) by mouth once a week.    Dispense:  4 capsule    Refill:  0    5. Obesity: Current BMI 38.7 Michaela Hanson is currently in the action stage of change. As such, her goal is to continue with weight loss efforts.  She has agreed to the Category 2 Plan.  May journal lunch and or dinner: Lunch: 300-400 calories, 30+ grams protein Dinner: 400-500 calories, 35+ grams protein This information was sent via MyChart.  Exercise goals: Increase exercise duration and frequency.  Behavioral modification strategies: increasing lean protein intake, decreasing simple carbohydrates, and meal planning and cooking strategies.  Michaela Hanson has agreed to follow-up with our clinic in 3 weeks.   No orders of the defined types were placed in this encounter.   Medications Discontinued During This Encounter  Medication Reason   buPROPion (WELLBUTRIN SR) 150 MG 12 hr tablet Patient Preference   ergocalciferol (VITAMIN D2) 1.25 MG (50000 UT) capsule Reorder   metFORMIN (GLUCOPHAGE) 500 MG tablet Dose change     Meds ordered this encounter  Medications   DISCONTD: metFORMIN (GLUCOPHAGE) 500 MG tablet    Sig: Take 1 tablet (500 mg total) by mouth daily after lunch.    Dispense:  30 tablet    Refill:  0    Order Specific Question:   Supervising Provider    Answer:   Wilder Glade [[id number]]   ergocalciferol (VITAMIN D2) 1.25 MG (50000 UT) capsule    Sig: Take 1 capsule (50,000 Units total) by mouth once a week.    Dispense:  4 capsule    Refill:  0    Order Specific Question:   Supervising Provider    Answer:   Quillian Quince D [[id number]]   metFORMIN (GLUCOPHAGE) 500 MG tablet    Sig: Take 0.5 tablets (250 mg total) by mouth daily with lunch.     Dispense:  30 tablet    Refill:  0    Order Specific Question:   Supervising Provider    Answer:   Quillian Quince D [[id number]]      Objective:   VITALS: Per patient if applicable, see vitals. GENERAL: Alert and in no acute distress. CARDIOPULMONARY: No increased WOB. Speaking in clear sentences.  PSYCH: Pleasant and cooperative. Speech normal rate and rhythm. Affect is appropriate. Insight and judgement are appropriate. Attention is focused, linear, and appropriate.  NEURO: Oriented as arrived to appointment on time with no prompting.   Lab Results  Component Value Date   CREATININE 0.84 05/22/2022   BUN 11 05/22/2022   NA 142  05/22/2022   K 4.8 05/22/2022   CL 102 05/22/2022   CO2 26 05/22/2022   Lab Results  Component Value Date   ALT 13 05/22/2022   AST 21 05/22/2022   ALKPHOS 85 05/22/2022   BILITOT 0.5 05/22/2022   Lab Results  Component Value Date   HGBA1C 6.1 (H) 05/22/2022   HGBA1C 5.9 (H) 09/26/2021   HGBA1C 6.3 (H) 05/28/2021   HGBA1C 6.2 (H) 02/26/2021   HGBA1C 5.7 (H) 11/28/2019   Lab Results  Component Value Date   INSULIN 9.7 05/22/2022   INSULIN 7.5 09/26/2021   INSULIN 8.4 05/28/2021   INSULIN 15.7 02/26/2021   Lab Results  Component Value Date   TSH 3.720 02/26/2021   Lab Results  Component Value Date   CHOL 181 05/22/2022   HDL 61 05/22/2022   LDLCALC 113 (H) 05/22/2022   TRIG 36 05/22/2022   CHOLHDL 2.9 11/28/2019   Lab Results  Component Value Date   WBC 3.4 09/26/2021   HGB 12.4 09/26/2021   HCT 36.3 09/26/2021   MCV 89 09/26/2021   PLT 203 09/26/2021   Lab Results  Component Value Date   IRON 60 05/28/2021   TIBC 358 05/28/2021   FERRITIN 64 05/28/2021   Lab Results  Component Value Date   VD25OH 38.2 05/22/2022   VD25OH 40.7 09/26/2021   VD25OH 50.0 05/28/2021    Attestation Statements:   Reviewed by clinician on day of visit: allergies, medications, problem list, medical history, surgical history, family history,  social history, and previous encounter notes.

## 2022-07-02 ENCOUNTER — Encounter (INDEPENDENT_AMBULATORY_CARE_PROVIDER_SITE_OTHER): Payer: Self-pay | Admitting: Family Medicine

## 2022-07-02 ENCOUNTER — Other Ambulatory Visit (INDEPENDENT_AMBULATORY_CARE_PROVIDER_SITE_OTHER): Payer: Self-pay | Admitting: Family Medicine

## 2022-07-02 ENCOUNTER — Telehealth (INDEPENDENT_AMBULATORY_CARE_PROVIDER_SITE_OTHER): Payer: BC Managed Care – PPO | Admitting: Family Medicine

## 2022-07-02 ENCOUNTER — Ambulatory Visit (INDEPENDENT_AMBULATORY_CARE_PROVIDER_SITE_OTHER): Payer: BC Managed Care – PPO | Admitting: Family Medicine

## 2022-07-02 DIAGNOSIS — E559 Vitamin D deficiency, unspecified: Secondary | ICD-10-CM

## 2022-07-02 DIAGNOSIS — Z6838 Body mass index (BMI) 38.0-38.9, adult: Secondary | ICD-10-CM

## 2022-07-02 DIAGNOSIS — R7303 Prediabetes: Secondary | ICD-10-CM | POA: Diagnosis not present

## 2022-07-02 DIAGNOSIS — E7849 Other hyperlipidemia: Secondary | ICD-10-CM | POA: Diagnosis not present

## 2022-07-02 DIAGNOSIS — F3289 Other specified depressive episodes: Secondary | ICD-10-CM | POA: Diagnosis not present

## 2022-07-02 DIAGNOSIS — E669 Obesity, unspecified: Secondary | ICD-10-CM

## 2022-07-02 MED ORDER — METFORMIN HCL 500 MG PO TABS: 500.0000 mg | ORAL_TABLET | Freq: Every day | ORAL | 0 refills | Status: DC

## 2022-07-02 MED ORDER — METFORMIN HCL 500 MG PO TABS: 250.0000 mg | ORAL_TABLET | Freq: Every day | ORAL | 0 refills | Status: DC

## 2022-07-02 MED ORDER — ERGOCALCIFEROL 1.25 MG (50000 UT) PO CAPS
50000.0000 [IU] | ORAL_CAPSULE | ORAL | 0 refills | Status: DC
Start: 2022-07-02 — End: 2022-07-29

## 2022-07-23 ENCOUNTER — Telehealth (INDEPENDENT_AMBULATORY_CARE_PROVIDER_SITE_OTHER): Payer: BC Managed Care – PPO | Admitting: Family Medicine

## 2022-07-24 ENCOUNTER — Telehealth (INDEPENDENT_AMBULATORY_CARE_PROVIDER_SITE_OTHER): Payer: BC Managed Care – PPO | Admitting: Family Medicine

## 2022-07-28 NOTE — Progress Notes (Addendum)
TeleHealth Visit:  This visit was completed with telemedicine (audio/video) technology. Michaela Hanson has verbally consented to this TeleHealth visit. The patient is located at home, the provider is located at home. The participants in this visit include the listed provider and patient. The visit was conducted today via MyChart video.  Michaela Hanson is here to discuss her progress with her Michaela treatment plan along with follow-up of her Michaela related diagnoses.   Today's visit was # 19 Starting weight: 258 lbs Starting date: 02/26/2021 Weight at last in office visit: 247 lbs on 06/10/22 Total weight loss: 11 lbs at last in office visit on 06/10/22. Reported weight at last visit (virtual): 244.8 pounds. Today's reported weight: 246 lbs  Weight change since last visit: +2  Nutrition Plan: Category 2 Plan.  May journal lunch and or dinner: Lunch: 300-400 calories, 30+ grams protein Dinner: 400-500 calories, 35+ grams protein  Current exercise: none  Interim History: Michaela Hanson is undergoing quite a bit of stress because her father has been ill (COVID, prostate infection, kidney stones) and she is checking on him daily.  She also went on vacation since her last visit. She is upset because she feels she has not done well on the plan for a while. She is usually on plan at breakfast but then ends to skip lunch or dinner because of change in schedule due to visiting her father. She has not been doing extra walking at work recently.  She is supposed to have an ablation for pain in her back but is not sure that she will.  She is also supposed to begin 3 months of physical therapy for back pain.  Assessment/Plan:  1. Vitamin Hanson Deficiency Vitamin Hanson is not at goal of 50.  Last vitamin Hanson was 38.2 on 05/22/2022. She is on weekly prescription Vitamin Hanson 50,000 IU.  Lab Results  Component Value Date   VD25OH 38.2 05/22/2022   VD25OH 40.7 09/26/2021   VD25OH 50.0 05/28/2021     Plan: Refill prescription vitamin Hanson 50,000 IU weekly.  2. Eating disorder/emotional eating Has been struggling to stay on plan and sometimes eating things that she knows that she should not.  She is undergoing a lot of stress currently. Currently this is poorly controlled. Overall mood is stable.  However sometimes she feels she is depressed because of her lack of progress with the meal plan recently Medication(s): None  Plan: Complete referral to Dr. Mallie Mussel at next in office visit on 09/09/2022.  She can go by the office before that to complete the paperwork if she would like. She will need to complete new patient paperwork.  3. Prediabetes Michaela Hanson is most recent A1c was 6.1 on 05/22/2022.  She was prescribed metformin but has not started taking it.  She is not sure why she is afraid to start the medication.  I had told her that she could take half a pill (250 mg)  Lab Results  Component Value Date   HGBA1C 6.1 (H) 05/22/2022   Lab Results  Component Value Date   INSULIN 9.7 05/22/2022   INSULIN 7.5 09/26/2021   INSULIN 8.4 05/28/2021   INSULIN 15.7 02/26/2021    Plan: She plans to start taking 1/2 pill (250 mg) of metformin daily as directed.  We discussed the benefits of metformin.   4. Michaela: Current BMI 38.6 Michaela Hanson is currently in the action stage of change. As such, her goal is to continue with weight loss efforts.  She has agreed to the Category  2 Plan.   Pack sandwich to eat after work.  Exercise goals: Increase walking at work.  Behavioral modification strategies: increasing lean protein intake, no skipping meals, meal planning and cooking strategies, emotional eating strategies, and planning for success.  Michaela Hanson has agreed to follow-up with our clinic in 3 weeks.   No orders of the defined types were placed in this encounter.   Medications Discontinued During This Encounter  Medication Reason   acetaminophen-codeine (TYLENOL #3) 300-30 MG tablet  Discontinued by provider   ergocalciferol (VITAMIN D2) 1.25 MG (50000 UT) capsule Reorder     Meds ordered this encounter  Medications   ergocalciferol (VITAMIN D2) 1.25 MG (50000 UT) capsule    Sig: Take 1 capsule (50,000 Units total) by mouth once a week.    Dispense:  4 capsule    Refill:  0    Order Specific Question:   Supervising Provider    Answer:   Michaela Hanson [[id number]]      Objective:   VITALS: Per patient if applicable, see vitals. GENERAL: Alert and in no acute distress. CARDIOPULMONARY: No increased WOB. Speaking in clear sentences.  PSYCH: Pleasant and cooperative. Speech normal rate and rhythm. Affect is appropriate. Insight and judgement are appropriate. Attention is focused, linear, and appropriate.  NEURO: Oriented as arrived to appointment on time with no prompting.   Lab Results  Component Value Date   CREATININE 0.84 05/22/2022   BUN 11 05/22/2022   NA 142 05/22/2022   K 4.8 05/22/2022   CL 102 05/22/2022   CO2 26 05/22/2022   Lab Results  Component Value Date   ALT 13 05/22/2022   AST 21 05/22/2022   ALKPHOS 85 05/22/2022   BILITOT 0.5 05/22/2022   Lab Results  Component Value Date   HGBA1C 6.1 (H) 05/22/2022   HGBA1C 5.9 (H) 09/26/2021   HGBA1C 6.3 (H) 05/28/2021   HGBA1C 6.2 (H) 02/26/2021   HGBA1C 5.7 (H) 11/28/2019   Lab Results  Component Value Date   INSULIN 9.7 05/22/2022   INSULIN 7.5 09/26/2021   INSULIN 8.4 05/28/2021   INSULIN 15.7 02/26/2021   Lab Results  Component Value Date   TSH 3.720 02/26/2021   Lab Results  Component Value Date   CHOL 181 05/22/2022   HDL 61 05/22/2022   LDLCALC 113 (H) 05/22/2022   TRIG 36 05/22/2022   CHOLHDL 2.9 11/28/2019   Lab Results  Component Value Date   WBC 3.4 09/26/2021   HGB 12.4 09/26/2021   HCT 36.3 09/26/2021   MCV 89 09/26/2021   PLT 203 09/26/2021   Lab Results  Component Value Date   IRON 60 05/28/2021   TIBC 358 05/28/2021   FERRITIN 64 05/28/2021   Lab  Results  Component Value Date   VD25OH 38.2 05/22/2022   VD25OH 40.7 09/26/2021   VD25OH 50.0 05/28/2021    Attestation Statements:   Reviewed by clinician on day of visit: allergies, medications, problem list, medical history, surgical history, family history, social history, and previous encounter notes.

## 2022-07-29 ENCOUNTER — Telehealth (INDEPENDENT_AMBULATORY_CARE_PROVIDER_SITE_OTHER): Payer: BC Managed Care – PPO | Admitting: Family Medicine

## 2022-07-29 ENCOUNTER — Encounter (INDEPENDENT_AMBULATORY_CARE_PROVIDER_SITE_OTHER): Payer: Self-pay | Admitting: Family Medicine

## 2022-07-29 DIAGNOSIS — E559 Vitamin D deficiency, unspecified: Secondary | ICD-10-CM | POA: Diagnosis not present

## 2022-07-29 DIAGNOSIS — Z6838 Body mass index (BMI) 38.0-38.9, adult: Secondary | ICD-10-CM

## 2022-07-29 DIAGNOSIS — R7303 Prediabetes: Secondary | ICD-10-CM

## 2022-07-29 DIAGNOSIS — E669 Obesity, unspecified: Secondary | ICD-10-CM

## 2022-07-29 DIAGNOSIS — F3289 Other specified depressive episodes: Secondary | ICD-10-CM

## 2022-07-29 MED ORDER — ERGOCALCIFEROL 1.25 MG (50000 UT) PO CAPS: 50000.0000 [IU] | ORAL_CAPSULE | ORAL | 0 refills | Status: AC

## 2022-08-19 ENCOUNTER — Telehealth (INDEPENDENT_AMBULATORY_CARE_PROVIDER_SITE_OTHER): Payer: BC Managed Care – PPO | Admitting: Family Medicine

## 2022-08-19 NOTE — Progress Notes (Deleted)
TeleHealth Visit:  This visit was completed with telemedicine (audio/video) technology. Michaela Hanson has verbally consented to this TeleHealth visit. The patient is located at home, the provider is located at home. The participants in this visit include the listed provider and patient. The visit was conducted today via MyChart video.  OBESITY Michaela Hanson is here to discuss her progress with her obesity treatment plan along with follow-up of her obesity related diagnoses.   Today's visit was # 20 Starting weight: 258 lbs Starting date: 02/26/2021 Weight at last in office visit: 247 lbs on 06/10/22 Total weight loss: 11 lbs at last in office visit on 06/10/22. Reported weight at last visit (virtual): 246 pounds. Today's reported weight: *** lbs No weight reported. Weight change since last visit: ***   Nutrition Plan: the Category 2 Plan.   Current exercise: {exercise types:16438} walking at work-6000 steps  Interim History: ***  Assessment/Plan:  1. ***  2. ***  3. ***  Obesity: Current BMI *** Michaela Hanson {CHL AMB IS/IS NOT:210130109} currently in the action stage of change. As such, her goal is to {MWMwtloss#1:210800005}.  She has agreed to {MWMwtlossportion/plan2:23431}.   Exercise goals: {MWM EXERCISE RECS:23473}  Behavioral modification strategies: {MWMwtlossdietstrategies3:23432}.  Michaela Hanson has agreed to follow-up with our clinic in {NUMBER 1-10:22536} weeks.   No orders of the defined types were placed in this encounter.   There are no discontinued medications.   No orders of the defined types were placed in this encounter.     Objective:   VITALS: Per patient if applicable, see vitals. GENERAL: Alert and in no acute distress. CARDIOPULMONARY: No increased WOB. Speaking in clear sentences.  PSYCH: Pleasant and cooperative. Speech normal rate and rhythm. Affect is appropriate. Insight and judgement are appropriate. Attention is focused, linear, and appropriate.   NEURO: Oriented as arrived to appointment on time with no prompting.   Lab Results  Component Value Date   CREATININE 0.84 05/22/2022   BUN 11 05/22/2022   NA 142 05/22/2022   K 4.8 05/22/2022   CL 102 05/22/2022   CO2 26 05/22/2022   Lab Results  Component Value Date   ALT 13 05/22/2022   AST 21 05/22/2022   ALKPHOS 85 05/22/2022   BILITOT 0.5 05/22/2022   Lab Results  Component Value Date   HGBA1C 6.1 (H) 05/22/2022   HGBA1C 5.9 (H) 09/26/2021   HGBA1C 6.3 (H) 05/28/2021   HGBA1C 6.2 (H) 02/26/2021   HGBA1C 5.7 (H) 11/28/2019   Lab Results  Component Value Date   INSULIN 9.7 05/22/2022   INSULIN 7.5 09/26/2021   INSULIN 8.4 05/28/2021   INSULIN 15.7 02/26/2021   Lab Results  Component Value Date   TSH 3.720 02/26/2021   Lab Results  Component Value Date   CHOL 181 05/22/2022   HDL 61 05/22/2022   LDLCALC 113 (H) 05/22/2022   TRIG 36 05/22/2022   CHOLHDL 2.9 11/28/2019   Lab Results  Component Value Date   WBC 3.4 09/26/2021   HGB 12.4 09/26/2021   HCT 36.3 09/26/2021   MCV 89 09/26/2021   PLT 203 09/26/2021   Lab Results  Component Value Date   IRON 60 05/28/2021   TIBC 358 05/28/2021   FERRITIN 64 05/28/2021   Lab Results  Component Value Date   VD25OH 38.2 05/22/2022   VD25OH 40.7 09/26/2021   VD25OH 50.0 05/28/2021    Attestation Statements:   Reviewed by clinician on day of visit: allergies, medications, problem list, medical history, surgical history, family history, social  history, and previous encounter notes.  ***(delete if time-based billing not used) Time spent on visit including the items listed below was *** minutes.  -preparing to see the patient (e.g., review of tests, history, previous notes) -obtaining and/or reviewing separately obtained history -counseling and educating the patient/family/caregiver -documenting clinical information in the electronic or other health record -ordering medications, tests, or  procedures -independently interpreting results and communicating results to the patient/ family/caregiver -referring and communicating with other health care professionals  -care coordination

## 2022-09-07 ENCOUNTER — Other Ambulatory Visit (INDEPENDENT_AMBULATORY_CARE_PROVIDER_SITE_OTHER): Payer: Self-pay | Admitting: Family Medicine

## 2022-09-07 DIAGNOSIS — E559 Vitamin D deficiency, unspecified: Secondary | ICD-10-CM

## 2022-09-09 ENCOUNTER — Ambulatory Visit (INDEPENDENT_AMBULATORY_CARE_PROVIDER_SITE_OTHER): Payer: BC Managed Care – PPO | Admitting: Family Medicine

## 2022-09-18 NOTE — Progress Notes (Signed)
TeleHealth Visit:  This visit was completed with telemedicine (audio/video) technology. Michaela Hanson has verbally consented to this TeleHealth visit. The patient is located at home, the provider is located at home. The participants in this visit include the listed provider and patient. The visit was conducted today via MyChart video.  OBESITY Michaela Hanson is here to discuss her progress with her obesity treatment plan along with follow-up of her obesity related diagnoses.   Today's visit was # 20 Starting weight: 258 lbs Starting date: 02/26/2021 Weight at last in office visit: 247 lbs on 06/10/22 Total weight loss: 11 lbs at last in office visit on 06/10/22. Weight reported last virtual visit on 07/29/2022: 246 pounds Today's reported weight: 240 lbs at PCP.   Nutrition Plan: the Category 2 Plan.   Current exercise: walking 20-30 minutes 5 days per week for last 3 weeks.  Interim History: Michaela Hanson has had a great deal of stress over the last 6 weeks.  Her dad was ill with a kidney stone, lithotripsy unsuccessful, had cystoscopy and stone extraction.  A few weeks later he was admitted to the hospital for sepsis due to small bowel obstruction and then had 2 surgeries and was intubated for a week.  He is currently in specialty surgical hospital within [facility] and then will go to rehab. Michaela Hanson has lost weight due to stress.  She is down 6 pounds from her last virtual visit.  She has been walking quite a bit at the hospital-she goes on intentional walks. She is skipping 1-2 meals per day.  Had labs done at recent PCP visit-I am able to view these in care everywhere.  He did not check vitamin D or A1c.  LDL was improved.  Assessment/Plan:  1. Prediabetes [name] has a diagnosis of prediabetes based on her elevated HgA1c. Medication(s): metformin 250 mg daily. Not taking consistently.  She took it 3 days in a row before her dad got sick and tolerated it well other than some mild nausea. She was  very nervous about starting metformin. Lab Results  Component Value Date   HGBA1C 6.1 (H) 05/22/2022   Lab Results  Component Value Date   INSULIN 9.7 05/22/2022   INSULIN 7.5 09/26/2021   INSULIN 8.4 05/28/2021   INSULIN 15.7 02/26/2021    Plan: She will restart metformin within the next 1 to 2 weeks and be consistent with taking it. Check A1c at next in office visit.   2.  Back pain with left-sided sciatica She did a few weeks of physical therapy and then her dad got sick.  The physical therapy was helping and back pain is very much improved.  She hopes to resume physical therapy.  Plan: She will restart physical therapy when she is able.  3. Obesity: Current BMI 38.7 Michaela Hanson is currently in the action stage of change. As such, her goal is to continue with weight loss efforts.  She has agreed to the Category 2 Plan.   She works mornings 8-12 and then go straight to the hospital to see her dad.  Encouraged her to pack a sandwich to take with her.   She will try to resume the category 2 plan.  Exercise goals: as is  Behavioral modification strategies: increasing lean protein intake, decreasing simple carbohydrates, no skipping meals, meal planning and cooking strategies, and planning for success.  Michaela Hanson has agreed to follow-up with our clinic in 4 weeks.   No orders of the defined types were placed in this encounter.   There  are no discontinued medications.   No orders of the defined types were placed in this encounter.     Objective:   VITALS: Per patient if applicable, see vitals. GENERAL: Alert and in no acute distress. CARDIOPULMONARY: No increased WOB. Speaking in clear sentences.  PSYCH: Pleasant and cooperative. Speech normal rate and rhythm. Affect is appropriate. Insight and judgement are appropriate. Attention is focused, linear, and appropriate.  NEURO: Oriented as arrived to appointment on time with no prompting.   Lab Results  Component Value  Date   CREATININE 0.84 05/22/2022   BUN 11 05/22/2022   NA 142 05/22/2022   K 4.8 05/22/2022   CL 102 05/22/2022   CO2 26 05/22/2022   Lab Results  Component Value Date   ALT 13 05/22/2022   AST 21 05/22/2022   ALKPHOS 85 05/22/2022   BILITOT 0.5 05/22/2022   Lab Results  Component Value Date   HGBA1C 6.1 (H) 05/22/2022   HGBA1C 5.9 (H) 09/26/2021   HGBA1C 6.3 (H) 05/28/2021   HGBA1C 6.2 (H) 02/26/2021   HGBA1C 5.7 (H) 11/28/2019   Lab Results  Component Value Date   INSULIN 9.7 05/22/2022   INSULIN 7.5 09/26/2021   INSULIN 8.4 05/28/2021   INSULIN 15.7 02/26/2021   Lab Results  Component Value Date   TSH 3.720 02/26/2021   Lab Results  Component Value Date   CHOL 181 05/22/2022   HDL 61 05/22/2022   LDLCALC 113 (H) 05/22/2022   TRIG 36 05/22/2022   CHOLHDL 2.9 11/28/2019   Lab Results  Component Value Date   WBC 3.4 09/26/2021   HGB 12.4 09/26/2021   HCT 36.3 09/26/2021   MCV 89 09/26/2021   PLT 203 09/26/2021   Lab Results  Component Value Date   IRON 60 05/28/2021   TIBC 358 05/28/2021   FERRITIN 64 05/28/2021   Lab Results  Component Value Date   VD25OH 38.2 05/22/2022   VD25OH 40.7 09/26/2021   VD25OH 50.0 05/28/2021    Attestation Statements:   Reviewed by clinician on day of visit: allergies, medications, problem list, medical history, surgical history, family history, social history, and previous encounter notes.  Time spent on visit including the items listed below was 32 minutes.  -preparing to see the patient (e.g., review of tests, history, previous notes) -obtaining and/or reviewing separately obtained history -counseling and educating the patient/family/caregiver -documenting clinical information in the electronic or other health record -ordering medications, tests, or procedures

## 2022-09-22 ENCOUNTER — Encounter (INDEPENDENT_AMBULATORY_CARE_PROVIDER_SITE_OTHER): Payer: Self-pay | Admitting: Family Medicine

## 2022-09-22 ENCOUNTER — Telehealth (INDEPENDENT_AMBULATORY_CARE_PROVIDER_SITE_OTHER): Payer: BC Managed Care – PPO | Admitting: Family Medicine

## 2022-09-22 DIAGNOSIS — M5432 Sciatica, left side: Secondary | ICD-10-CM

## 2022-09-22 DIAGNOSIS — R7303 Prediabetes: Secondary | ICD-10-CM

## 2022-09-22 DIAGNOSIS — E669 Obesity, unspecified: Secondary | ICD-10-CM | POA: Diagnosis not present

## 2022-09-22 DIAGNOSIS — Z6838 Body mass index (BMI) 38.0-38.9, adult: Secondary | ICD-10-CM

## 2022-10-21 ENCOUNTER — Ambulatory Visit (INDEPENDENT_AMBULATORY_CARE_PROVIDER_SITE_OTHER): Payer: BC Managed Care – PPO | Admitting: Family Medicine

## 2022-11-12 ENCOUNTER — Encounter: Payer: Self-pay | Admitting: Orthopaedic Surgery

## 2022-11-12 ENCOUNTER — Telehealth: Payer: Self-pay | Admitting: Orthopaedic Surgery

## 2022-11-12 ENCOUNTER — Ambulatory Visit (INDEPENDENT_AMBULATORY_CARE_PROVIDER_SITE_OTHER): Payer: BC Managed Care – PPO | Admitting: Orthopaedic Surgery

## 2022-11-12 VITALS — Ht 68.0 in | Wt 247.4 lb

## 2022-11-12 DIAGNOSIS — M25562 Pain in left knee: Secondary | ICD-10-CM | POA: Diagnosis not present

## 2022-11-12 DIAGNOSIS — M5442 Lumbago with sciatica, left side: Secondary | ICD-10-CM | POA: Diagnosis not present

## 2022-11-12 DIAGNOSIS — G8929 Other chronic pain: Secondary | ICD-10-CM | POA: Diagnosis not present

## 2022-11-12 MED ORDER — GABAPENTIN 300 MG PO CAPS: 300.0000 mg | ORAL_CAPSULE | Freq: Every day | ORAL | 0 refills | Status: DC

## 2022-11-12 MED ORDER — CELECOXIB 200 MG PO CAPS: 200.0000 mg | ORAL_CAPSULE | Freq: Two times a day (BID) | ORAL | 1 refills | Status: DC | PRN

## 2022-11-12 NOTE — Telephone Encounter (Signed)
Appointment scheduled for 11/20/2022 at 4pm. Appt slot was already open.

## 2022-11-12 NOTE — Progress Notes (Signed)
The patient comes in today with acute left knee pain but she points to the lateral aspect of her knee and down her legs as a source of her pain.  She x-rays are primary care physician today for "stomach issues".  She says she has a CT scan of her abdomen tomorrow.  We last saw her for her knee back in April of this year and a steroid injection made her pain go away 100% she said for that knee insulin been 2 weeks since she has been having pain.  She also reports low back pain.  On my exam today she has a profoundly positive straight leg raise on the left side.  There is really no true left knee pain but she does have pain down the IT band on the left side proximal to the knee and distal to the knee all laterally.  She has significant low back pain as well.  I would like to try Celebrex instead of diclofenac which is easier on her stomach.  I would also send in some gabapentin to take 300 mg at bedtime only.  I will see her back in just a week for repeat exam.  We can also see what the CT scan shows of her abdomen pelvis because it may show some pathology from her lumbar spine that we can look to see if there is any issues that is causing her to have the radicular symptoms going down her leg.  She agrees with this treatment plan.

## 2022-11-12 NOTE — Telephone Encounter (Signed)
Pt had an appt today with Dr Ninfa Linden and his note want to see her back in 1 wk. Please open appt for 1 wk and call pt about this matter at [phone].

## 2022-11-14 DIAGNOSIS — A048 Other specified bacterial intestinal infections: Secondary | ICD-10-CM | POA: Insufficient documentation

## 2022-11-17 ENCOUNTER — Telehealth (INDEPENDENT_AMBULATORY_CARE_PROVIDER_SITE_OTHER): Payer: BC Managed Care – PPO | Admitting: Family Medicine

## 2022-11-18 ENCOUNTER — Encounter: Payer: Self-pay | Admitting: Orthopaedic Surgery

## 2022-11-20 ENCOUNTER — Ambulatory Visit: Payer: BC Managed Care – PPO | Admitting: Orthopaedic Surgery

## 2022-11-26 ENCOUNTER — Ambulatory Visit: Payer: BC Managed Care – PPO | Admitting: Physician Assistant

## 2022-12-10 ENCOUNTER — Encounter (HOSPITAL_BASED_OUTPATIENT_CLINIC_OR_DEPARTMENT_OTHER): Payer: Self-pay | Admitting: Obstetrics & Gynecology

## 2022-12-10 ENCOUNTER — Ambulatory Visit (INDEPENDENT_AMBULATORY_CARE_PROVIDER_SITE_OTHER): Payer: BC Managed Care – PPO | Admitting: Obstetrics & Gynecology

## 2022-12-10 VITALS — BP 126/88 | HR 78 | Ht 68.0 in | Wt 242.4 lb

## 2022-12-10 DIAGNOSIS — Z780 Asymptomatic menopausal state: Secondary | ICD-10-CM

## 2022-12-10 DIAGNOSIS — N952 Postmenopausal atrophic vaginitis: Secondary | ICD-10-CM

## 2022-12-10 DIAGNOSIS — Z01419 Encounter for gynecological examination (general) (routine) without abnormal findings: Secondary | ICD-10-CM | POA: Diagnosis not present

## 2022-12-10 DIAGNOSIS — Z86010 Personal history of colonic polyps: Secondary | ICD-10-CM

## 2022-12-10 DIAGNOSIS — Z9889 Other specified postprocedural states: Secondary | ICD-10-CM | POA: Diagnosis not present

## 2022-12-10 DIAGNOSIS — R7303 Prediabetes: Secondary | ICD-10-CM

## 2022-12-10 NOTE — Progress Notes (Signed)
57 y.o. G70P2002 Married [place] American female here for annual exam.  Son got married in October.  She loves her daughter-in-law.  This was a joy for her family.  She's taking care of her dad.  He's been sick since October.    Had Covid in the winter.  Had H pylori.  Is still having some GI issues.  Work is good.    Denies vaginal bleeding.  No LMP recorded. Patient has had an ablation.          Sexually active: Yes.    The current method of family planning is post menopausal status.    Smoker:  no  Health Maintenance: Pap:  12/09/2021 Negative History of abnormal Pap:  yes with last abnormal  MMG:  07/29/2022 Negative Colonoscopy:  06/16/2018, follow up 7 years BMD:   not indicated Screening Labs: 11/202 and 11/2022   reports that she has never smoked. She has never used smokeless tobacco. She reports that she does not drink alcohol and does not use drugs.  Past Medical History:  Diagnosis Date   Abnormal Pap smear 8/13   8/13 ASCUS +HPV, 8/14 ASCUS HPV not detected, 06-26-14 ASCUS +HPV HR   Anemia    WITH PREGNANCY   Anxiety    Arthritis    LEFT KNEE   Back pain    Hypertension    Insomnia    Migraine headache    with aura (visual changes)   Other fatigue    SOB (shortness of breath) on exertion    Vitamin B12 deficiency    Vitamin D deficiency     Past Surgical History:  Procedure Laterality Date   ANKLE FRACTURE SURGERY Right 08/2019   APPENDECTOMY  2005   ARTHROSCOPY WITH ANTERIOR CRUCIATE LIGAMENT (ACL) REPAIR WITH ANTERIOR TIBILIAS GRAFT  03/07/2016    Dr. 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 60 capsule 1   clobetasol ointment (TEMOVATE) 0.05 % Apply topically 2 (two) times daily. Do not use for more than 7 days in a row. 30 g 1   ergocalciferol (VITAMIN D2) 1.25 MG (50000 UT) capsule Take 1 capsule (50,000 Units total) by mouth once a week. 4 capsule 0   estradiol (ESTRACE) 0.1 MG/GM vaginal cream 1 gram vaginally twice weekly 42.5 g 3   ferrous sulfate 325 (65 FE) MG tablet Take by mouth.     gabapentin (NEURONTIN) 300 MG capsule Take 1 capsule (300 mg total) by mouth at bedtime. 30 capsule 0   hydrochlorothiazide (HYDRODIURIL) 25 MG tablet Take 25 mg by mouth daily. Takes 1/2 12.5mg      levocetirizine (XYZAL) 5 MG tablet Take by mouth.     Multiple Vitamins-Minerals (MULTIVITAMIN PO) Take by mouth daily.     No current facility-administered medications for this visit.    Family History  Problem Relation Age of Onset   Hypertension Mother    Hypertension Father    Hyperlipidemia Father    Cancer Father  kidney cancer   Thyroid disease Father    Breast cancer Sister 65   Breast cancer Paternal Aunt 17       died of pancreatic cancer age 30    ROS: Constitutional: negative Genitourinary:negative  Exam:   BP 126/88 (BP Location: Left Arm, Patient Position: Sitting, Cuff Size: Large)   Pulse 78   Ht 5\' 8"  (1.727 m) Comment: Reported  Wt 242 lb 6.4 oz (110 kg)   BMI 36.86 kg/m   Height: 5\' 8"  (172.7 cm) (Reported)  General appearance: alert, cooperative and appears stated age Head: Normocephalic, without obvious abnormality, atraumatic Neck: no adenopathy, supple, symmetrical, trachea midline and thyroid normal to inspection and palpation Lungs: clear to auscultation bilaterally Breasts: normal appearance, no masses or tenderness Heart: regular rate and rhythm Abdomen: soft, non-tender; bowel sounds  normal; no masses,  no organomegaly Extremities: extremities normal, atraumatic, no cyanosis or edema Skin: Skin color, texture, turgor normal. No rashes or lesions Lymph nodes: Cervical, supraclavicular, and axillary nodes normal. No abnormal inguinal nodes palpated Neurologic: Grossly normal   Pelvic: External genitalia:  no lesions              Urethra:  normal appearing urethra with no masses, tenderness or lesions              Bartholins and Skenes: normal                 Vagina: normal appearing vagina with normal color and no discharge, no lesions              Cervix: no lesions              Pap taken: No. Bimanual Exam:  Uterus:  normal size, contour, position, consistency, mobility, non-tender              Adnexa: normal adnexa and no mass, fullness, tenderness               Rectovaginal: Confirms               Anus:  normal sphincter tone, no lesions  Chaperone, Octaviano Batty, CMA, was present for exam.  Assessment/Plan: 1. Well woman exam with routine gynecological exam - Pap smear neg with neg HR HPV 2023.  Not indicated today. - Mammogram 07/2022 - Colonoscopy 2019.  Follow up 5 years but polyp was 79mm.  I will double check about follow up. - lab work done with Dr. Lorne Skeens - vaccines reviewed/updated  2. Postmenopausal - not on HRT  3. H/O LEEP  4. Vaginal atrophy  5. History of colon polyps  6. Prediabetes - last check was July 2023

## 2022-12-16 ENCOUNTER — Telehealth (HOSPITAL_BASED_OUTPATIENT_CLINIC_OR_DEPARTMENT_OTHER): Payer: Self-pay

## 2022-12-16 NOTE — Telephone Encounter (Signed)
Called patient to let her know her colonoscopy is due in 7 years from the last one.  It was in 2019, so will be 2026.  Per Dr. Sabra Heck, she will refer her to [facility] when we get a little closer to that time. Patient expresses understanding. tbw

## 2022-12-25 ENCOUNTER — Other Ambulatory Visit (HOSPITAL_BASED_OUTPATIENT_CLINIC_OR_DEPARTMENT_OTHER): Payer: Self-pay | Admitting: Obstetrics & Gynecology

## 2022-12-25 ENCOUNTER — Encounter (HOSPITAL_BASED_OUTPATIENT_CLINIC_OR_DEPARTMENT_OTHER): Payer: Self-pay | Admitting: Obstetrics & Gynecology

## 2022-12-25 DIAGNOSIS — N952 Postmenopausal atrophic vaginitis: Secondary | ICD-10-CM

## 2022-12-25 MED ORDER — ESTRADIOL 0.1 MG/GM VA CREA: TOPICAL_CREAM | VAGINAL | 3 refills | Status: AC

## 2022-12-29 ENCOUNTER — Encounter: Payer: Self-pay | Admitting: *Deleted

## 2023-02-16 IMAGING — MR MR KNEE*L* W/O CM
7 series · 40 of 40 positions shown · non-contrast
Comparison: None.

CLINICAL DATA: Left knee pain.  No known injury.

EXAM:
MRI OF THE LEFT KNEE WITHOUT CONTRAST
TECHNIQUE: Multiplanar, multisequence MR imaging of the knee was performed. No
intravenous contrast was administered.

[Series 6: T2 fat-sat · axial · 4.0mm · 0.62mm/px · z∈[-4,+100]mm · 7 of 22 slices shown (1 of 3)]
[im 1/22]
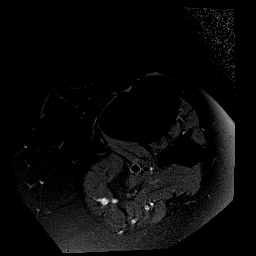
[im 4/22]
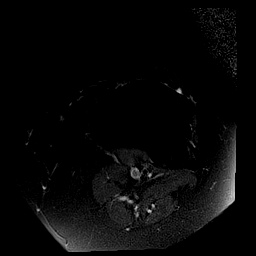
[im 8/22]
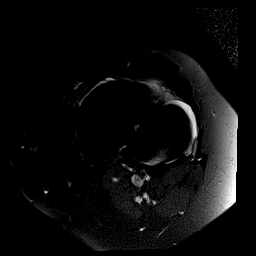
[im 11/22]
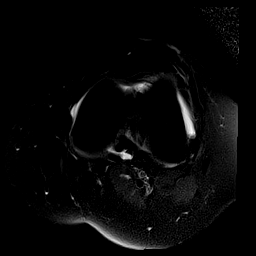
[im 15/22]
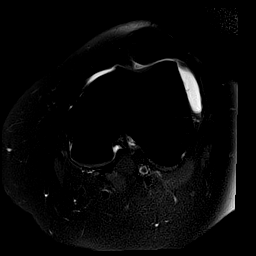
[im 18/22]
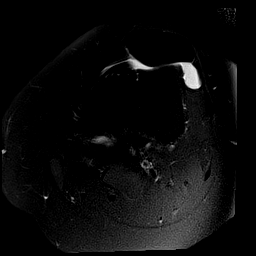
[im 22/22]
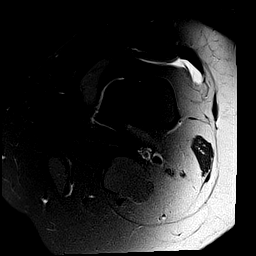

[Series 7: T1 · coronal · 4.0mm · 0.59mm/px · 6 of 22 slices shown]
[im 1/22]
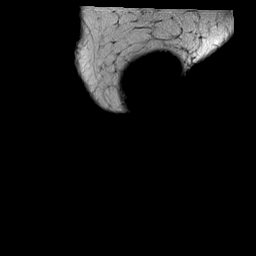
[im 5/22]
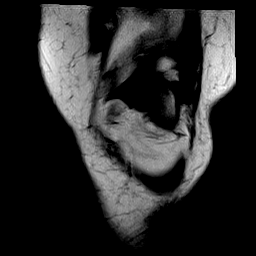
[im 9/22]
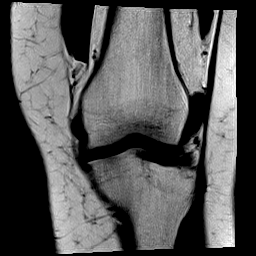
[im 13/22]
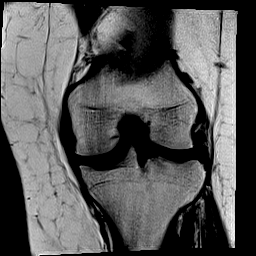
[im 17/22]
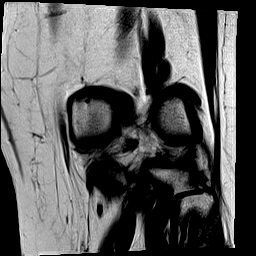
[im 22/22]
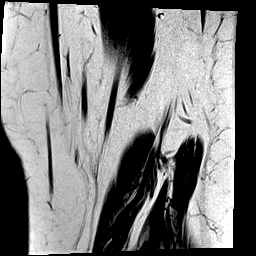

[Series 8: T2 fat-sat · coronal · 4.0mm · 0.59mm/px · 6 of 22 slices shown (2 of 3)]
[im 1/22]
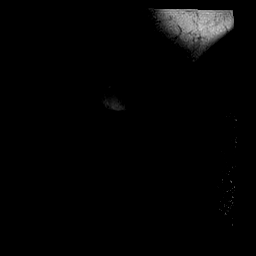
[im 5/22]
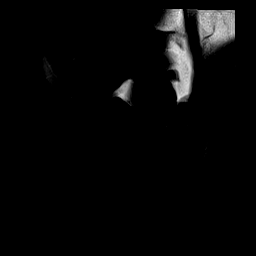
[im 9/22]
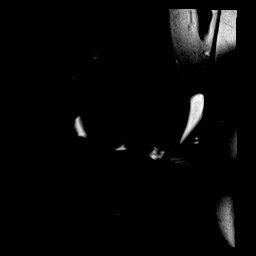
[im 13/22]
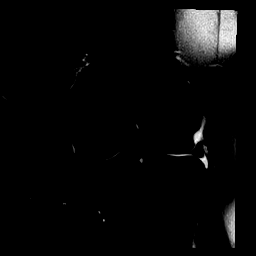
[im 17/22]
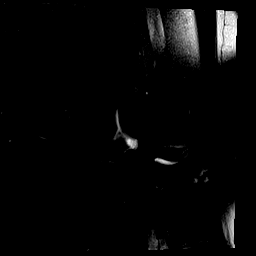
[im 22/22]
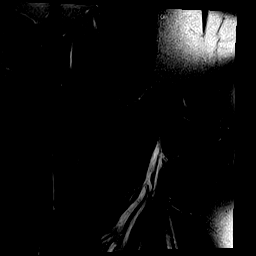

[Series 9: PD fat-sat · coronal · 4.0mm · 0.59mm/px · 6 of 22 slices shown (1 of 2)]
[im 1/22]
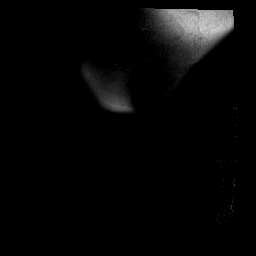
[im 5/22]
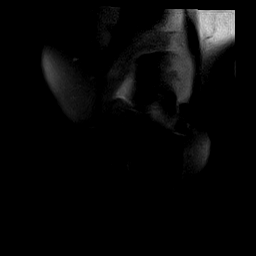
[im 9/22]
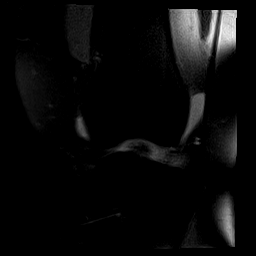
[im 13/22]
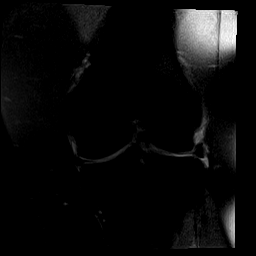
[im 17/22]
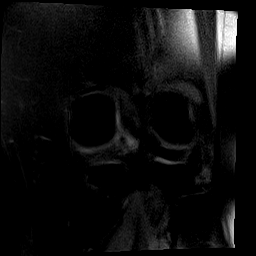
[im 22/22]
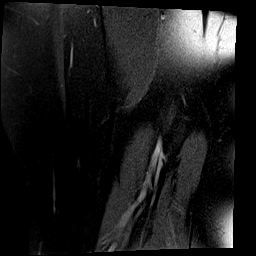

[Series 11: T2 fat-sat · sagittal · 3.0mm · 0.59mm/px · 7 of 25 slices shown (3 of 3)]
[im 1/25]
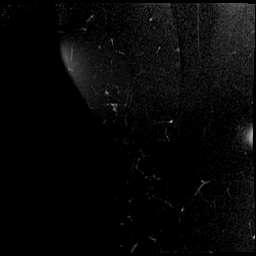
[im 5/25]
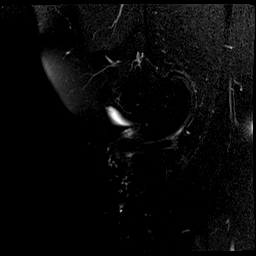
[im 9/25]
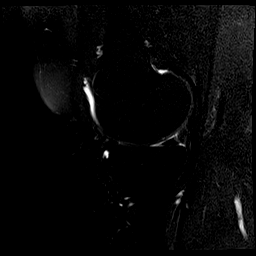
[im 13/25]
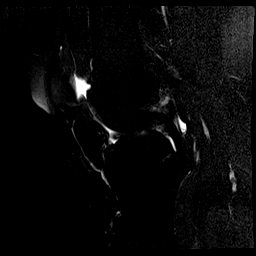
[im 17/25]
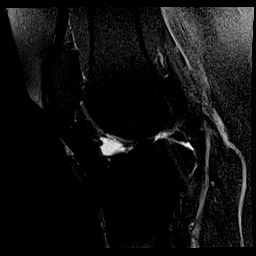
[im 21/25]
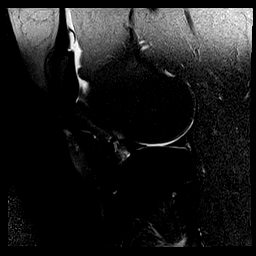
[im 25/25]
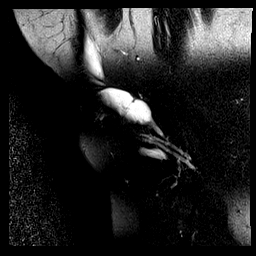

[Series 12: PD fat-sat · sagittal · 3.0mm · 0.59mm/px · 7 of 25 slices shown (2 of 2)]
[im 1/25]
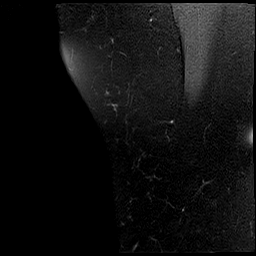
[im 5/25]
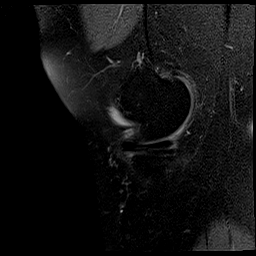
[im 9/25]
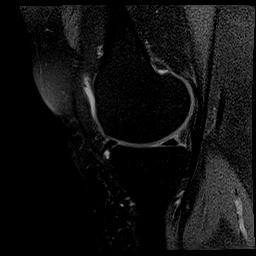
[im 13/25]
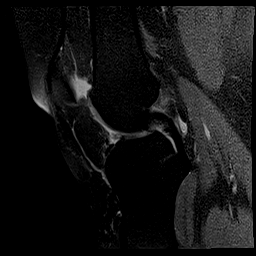
[im 17/25]
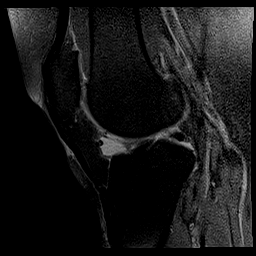
[im 21/25]
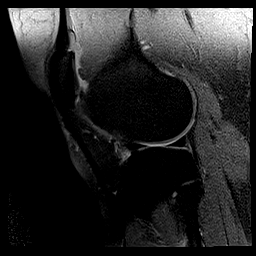
[im 25/25]
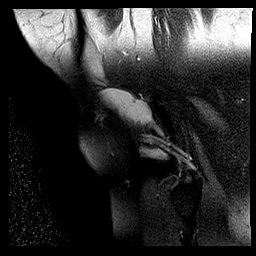

[Series 100: hx · axial · 8.0mm · 0.68mm/px · 1 of 1 slices shown]
[im 1/1]
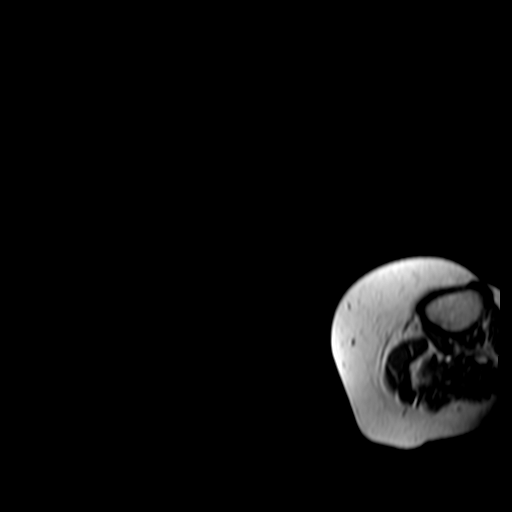

[40 of 40 positions shown; findings below may reference images not displayed]

FINDINGS: MENISCI

Medial: Small radial tear of the free edge of the body of the medial
meniscus.

Lateral: Small radial tear of the free edge of the body of the
lateral meniscus.

LIGAMENTS

Cruciates: Intact ACL and PCL. ACL is expanded and increased in
signal as can be seen with mucinous degeneration.

Collaterals: Medial collateral ligament is intact. Lateral
collateral ligament complex is intact.

CARTILAGE

Patellofemoral: Partial-thickness cartilage loss of the lateral
patellofemoral compartment.

Medial: Mild partial-thickness cartilage loss of the medial
femorotibial compartment.

Lateral: Mild partial-thickness cartilage loss of the lateral
femorotibial compartment.

JOINT: Small joint effusion. Normal Jeremy Diaz Fermaint Aldarondo. No plical
thickening. 16 mm cystic area along the anterior knee joint adjacent
to the root of the anterior horn of the lateral meniscus and distal
insertion of the ACL which may reflect a ganglion cyst versus
parameniscal cyst.

POPLITEAL FOSSA: Popliteus tendon is intact. No Baker's cyst.

EXTENSOR MECHANISM: Intact quadriceps tendon. Intact patellar
tendon. Intact lateral patellar retinaculum. Intact medial patellar
retinaculum. Intact MPFL. TT-TG distance 15 mm.

BONES: No aggressive osseous lesion. No fracture or dislocation.

Other: No fluid collection or hematoma. Muscles are normal.
IMPRESSION: 1. Small radial tear of the free edge of the body of the medial
meniscus.
2. Small radial tear of the free edge of the body of the lateral
meniscus.
3. Tricompartmental cartilage abnormalities as described above.

## 2023-04-20 ENCOUNTER — Ambulatory Visit (INDEPENDENT_AMBULATORY_CARE_PROVIDER_SITE_OTHER): Payer: BC Managed Care – PPO | Admitting: *Deleted

## 2023-04-20 VITALS — BP 121/82 | HR 74 | Ht 68.0 in | Wt 254.4 lb

## 2023-04-20 DIAGNOSIS — R3915 Urgency of urination: Secondary | ICD-10-CM

## 2023-04-20 LAB — POCT URINALYSIS DIPSTICK
Appearance: NORMAL
Bilirubin, UA: NEGATIVE
Glucose, UA: NEGATIVE
Ketones, UA: NEGATIVE
Leukocytes, UA: NEGATIVE
Nitrite, UA: NEGATIVE
Protein, UA: NEGATIVE
Spec Grav, UA: 1.015 (ref 1.010–1.025)
Urobilinogen, UA: 0.2 E.U./dL
pH, UA: 5.5 (ref 5.0–8.0)

## 2023-04-20 NOTE — Progress Notes (Signed)
Pt presents to office with complaints of urinary urgency that has been going on a couple of weeks. Pt instructed on and performed clean catch urine. POCT showed a trace of blood. Advised that we would send urine for culture and get back to her with recommendations once results are received.

## 2023-04-21 ENCOUNTER — Encounter (HOSPITAL_BASED_OUTPATIENT_CLINIC_OR_DEPARTMENT_OTHER): Payer: Self-pay | Admitting: Obstetrics & Gynecology

## 2023-04-21 LAB — URINE CULTURE

## 2023-08-08 ENCOUNTER — Encounter (HOSPITAL_BASED_OUTPATIENT_CLINIC_OR_DEPARTMENT_OTHER): Payer: Self-pay | Admitting: Obstetrics & Gynecology

## 2023-09-22 ENCOUNTER — Encounter (HOSPITAL_BASED_OUTPATIENT_CLINIC_OR_DEPARTMENT_OTHER): Payer: Self-pay | Admitting: Obstetrics & Gynecology

## 2023-09-24 ENCOUNTER — Ambulatory Visit (HOSPITAL_BASED_OUTPATIENT_CLINIC_OR_DEPARTMENT_OTHER): Payer: BC Managed Care – PPO

## 2023-09-24 ENCOUNTER — Other Ambulatory Visit (HOSPITAL_COMMUNITY)
Admission: RE | Admit: 2023-09-24 | Discharge: 2023-09-24 | Disposition: A | Discharge status: Home / Self Care | Payer: BC Managed Care – PPO | Source: Ambulatory Visit | Attending: Obstetrics & Gynecology | Admitting: Obstetrics & Gynecology

## 2023-09-24 VITALS — BP 119/83 | HR 67 | Ht 68.0 in | Wt 257.0 lb

## 2023-09-24 DIAGNOSIS — N898 Other specified noninflammatory disorders of vagina: Secondary | ICD-10-CM | POA: Diagnosis present

## 2023-09-24 NOTE — Progress Notes (Signed)
Pt presented to office with watery discharge for her symptom. Pt denied itching, burning and odor. She stated she has had the watery discharge for 2-3 weeks now. Pt was instructed on how to perform self-swab (aptima) and pt notified she will be contacted once lab results return.

## 2023-09-25 LAB — CERVICOVAGINAL ANCILLARY ONLY
Bacterial Vaginitis (gardnerella): NEGATIVE
Candida Glabrata: NEGATIVE
Candida Vaginitis: NEGATIVE
Comment: NEGATIVE
Comment: NEGATIVE
Comment: NEGATIVE

## 2023-12-23 ENCOUNTER — Ambulatory Visit (HOSPITAL_BASED_OUTPATIENT_CLINIC_OR_DEPARTMENT_OTHER): Payer: 59 | Admitting: Obstetrics & Gynecology

## 2023-12-23 ENCOUNTER — Encounter (HOSPITAL_BASED_OUTPATIENT_CLINIC_OR_DEPARTMENT_OTHER): Payer: Self-pay | Admitting: Obstetrics & Gynecology

## 2023-12-23 VITALS — BP 124/72 | HR 67 | Ht 67.5 in | Wt 247.2 lb

## 2023-12-23 DIAGNOSIS — R7303 Prediabetes: Secondary | ICD-10-CM

## 2023-12-23 DIAGNOSIS — Z9889 Other specified postprocedural states: Secondary | ICD-10-CM

## 2023-12-23 DIAGNOSIS — Z01419 Encounter for gynecological examination (general) (routine) without abnormal findings: Secondary | ICD-10-CM | POA: Diagnosis not present

## 2023-12-23 DIAGNOSIS — Z860100 Personal history of colon polyps, unspecified: Secondary | ICD-10-CM

## 2023-12-23 DIAGNOSIS — Z780 Asymptomatic menopausal state: Secondary | ICD-10-CM | POA: Diagnosis not present

## 2023-12-23 NOTE — Progress Notes (Signed)
ANNUAL EXAM Patient name: Michaela Hanson MRN [id number]  Date of birth: 1966-04-04 Chief Complaint:   Here for AEX  History of Present Illness:   Michaela Hanson is a 58 y.o. G39P2002 African-American female being seen today for a routine annual exam.  Denies vaginal bleeding.  Denies hot flashes.  Does have decreased libido and frustrated with weight.  Has been seen at [facility] in the past.  She is working part time 8 - 12, Monday through Friday.  She is helping with her parents.     No LMP recorded. Patient has had an ablation.  She is PMP.    Last pap 12/09/2021. Results were: NILM w/ HRHPV negative. H/O abnormal pap: yes Last mammogram: 08/03/2023. Results were: normal. Family h/o breast cancer: yes , sister (had negative genetic testing) Last colonoscopy: 06/16/2018. Results were: abnormal tubular adenoma, follow up 7 years recommended.  Our office personally called about when follow up was due . Family h/o colorectal cancer: no     12/23/2023   11:24 AM 12/10/2022    2:02 PM 12/09/2021    2:29 PM 02/26/2021    7:50 AM  Depression screen PHQ 2/9  Decreased Interest 0 0 0 3  Down, Depressed, Hopeless 0 0 0 2  PHQ - 2 Score 0 0 0 5  Altered sleeping    0  Tired, decreased energy    0  Change in appetite    3  Feeling bad or failure about yourself     0  Trouble concentrating    2  Moving slowly or fidgety/restless    0  Suicidal thoughts    0  PHQ-9 Score    10  Difficult doing work/chores    Not difficult at all    Review of Systems:   Pertinent items are noted in HPI  Denies any headaches, blurred vision, fatigue, shortness of breath, chest pain, abdominal pain, abnormal vaginal discharge/itching/odor/irritation, problems with periods, bowel movements, urination, or intercourse unless otherwise stated above.  Pertinent History Reviewed:  Reviewed past medical,surgical, social and family history.   Reviewed problem list, medications and  allergies.  Physical Assessment:   Vitals:   12/23/23 1121  BP: 124/72  Pulse: 67  Weight: 247 lb 3.2 oz (112.1 kg)  Height: 5' 7.5" (1.715 m)  Body mass index is 38.15 kg/m.        Physical Examination:   General appearance - well appearing, and in no distress  Mental status - alert, oriented to person, place, and time  Psych:  She has a normal mood and affect  Skin - warm and dry, normal color, no suspicious lesions noted  Chest - effort normal, all lung fields clear to auscultation bilaterally  Heart - normal rate and regular rhythm  Neck:  midline trachea, no thyromegaly or nodules  Breasts - breasts appear normal, no suspicious masses, no skin or nipple changes or  axillary nodes  Abdomen - soft, nontender, nondistended, no masses or organomegaly  Pelvic - VULVA: normal appearing vulva with no masses, tenderness or lesions  VAGINA: normal appearing vagina with normal color and discharge, no lesions  CERVIX: normal appearing cervix without discharge or lesions, no CMT  Thin prep pap is not done.   UTERUS: uterus is felt to be normal size, shape, consistency and nontender   ADNEXA: No adnexal masses or tenderness noted.  Rectal - normal rectal, good sphincter tone, no masses felt.  Extremities:  No swelling or varicosities noted  Chaperone present for exam, Ina Homes, CMA.  Assessment & Plan:  1. Well woman exam with routine gynecological exam (Primary) - Pap smear 2023, will repeat next year - Mammogram 07/2023 - Colonoscopy 06/2017, follow up 7 yeras - Bone mineral density not indicated - lab work done with PCP, Dr. Roney Mans - vaccines reviewed/updated  2. Postmenopausal - not on HRT  3. H/O LEEP  4. Prediabetes - most recent hba1c was 6.2  5. History of colon polyps  Labs/procedures today: none   Meds: no new orders placed today   Follow-up: Return in about 1 year (around 12/22/2024).  Jerene Bears, MD 12/23/2023 12:02 PM

## 2023-12-28 ENCOUNTER — Ambulatory Visit (HOSPITAL_BASED_OUTPATIENT_CLINIC_OR_DEPARTMENT_OTHER): Payer: BC Managed Care – PPO | Admitting: Obstetrics & Gynecology

## 2023-12-28 ENCOUNTER — Encounter (HOSPITAL_BASED_OUTPATIENT_CLINIC_OR_DEPARTMENT_OTHER): Payer: Self-pay

## 2025-04-05 ENCOUNTER — Ambulatory Visit (HOSPITAL_BASED_OUTPATIENT_CLINIC_OR_DEPARTMENT_OTHER): Payer: Self-pay | Admitting: Obstetrics & Gynecology
